# Patient Record
Sex: Male | Born: 1985
Health system: Southern US, Community
[De-identification: ages and names within clinical notes are randomized; demographics above are authoritative.]

## PROBLEM LIST (undated history)

## (undated) DIAGNOSIS — F419 Anxiety disorder, unspecified: Secondary | ICD-10-CM

---

## 2008-06-18 ENCOUNTER — Emergency Department (HOSPITAL_COMMUNITY): Admission: EM | Admit: 2008-06-18 | Discharge: 2008-06-18 | Payer: Self-pay | Admitting: Emergency Medicine

## 2012-11-15 ENCOUNTER — Emergency Department (HOSPITAL_COMMUNITY): Payer: No Typology Code available for payment source

## 2012-11-15 ENCOUNTER — Encounter (HOSPITAL_COMMUNITY): Payer: Self-pay

## 2012-11-15 ENCOUNTER — Emergency Department (HOSPITAL_COMMUNITY)
Admission: EM | Admit: 2012-11-15 | Discharge: 2012-11-15 | Disposition: A | Discharge status: Home / Self Care | Payer: No Typology Code available for payment source | Attending: Emergency Medicine | Admitting: Emergency Medicine

## 2012-11-15 DIAGNOSIS — T07XXXA Unspecified multiple injuries, initial encounter: Secondary | ICD-10-CM

## 2012-11-15 DIAGNOSIS — IMO0002 Reserved for concepts with insufficient information to code with codable children: Secondary | ICD-10-CM | POA: Insufficient documentation

## 2012-11-15 DIAGNOSIS — Y9389 Activity, other specified: Secondary | ICD-10-CM | POA: Insufficient documentation

## 2012-11-15 DIAGNOSIS — F17200 Nicotine dependence, unspecified, uncomplicated: Secondary | ICD-10-CM | POA: Insufficient documentation

## 2012-11-15 DIAGNOSIS — Y92410 Unspecified street and highway as the place of occurrence of the external cause: Secondary | ICD-10-CM | POA: Insufficient documentation

## 2012-11-15 DIAGNOSIS — R51 Headache: Secondary | ICD-10-CM | POA: Insufficient documentation

## 2012-11-15 DIAGNOSIS — M25519 Pain in unspecified shoulder: Secondary | ICD-10-CM | POA: Insufficient documentation

## 2012-11-15 DIAGNOSIS — Z23 Encounter for immunization: Secondary | ICD-10-CM | POA: Insufficient documentation

## 2012-11-15 MED ORDER — HYDROCODONE-ACETAMINOPHEN 5-325 MG PO TABS: 2.0000 | ORAL_TABLET | ORAL | Status: DC | PRN

## 2012-11-15 MED ORDER — OXYCODONE-ACETAMINOPHEN 5-325 MG PO TABS
2.0000 | ORAL_TABLET | Freq: Once | ORAL | Status: AC
  Administered 2012-11-15: 2 via ORAL
  Filled 2012-11-15: qty 2

## 2012-11-15 MED ORDER — IBUPROFEN 600 MG PO TABS: 600.0000 mg | ORAL_TABLET | Freq: Four times a day (QID) | ORAL | Status: DC | PRN

## 2012-11-15 MED ORDER — TETANUS-DIPHTH-ACELL PERTUSSIS 5-2.5-18.5 LF-MCG/0.5 IM SUSP
0.5000 mL | Freq: Once | INTRAMUSCULAR | Status: AC
  Administered 2012-11-15: 0.5 mL via INTRAMUSCULAR
  Filled 2012-11-15: qty 0.5

## 2012-11-15 MED ORDER — CYCLOBENZAPRINE HCL 10 MG PO TABS: 10.0000 mg | ORAL_TABLET | Freq: Two times a day (BID) | ORAL | Status: DC | PRN
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
## 2012-11-15 NOTE — ED Notes (Signed)
PA at bedside.
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
## 2012-11-15 NOTE — ED Notes (Signed)
Patient transported to CT/XR ?

## 2012-11-15 NOTE — ED Notes (Signed)
Voiced understanding of instructions given 
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
## 2012-11-16 NOTE — ED Provider Notes (Signed)
Medical screening examination/treatment/procedure(s) were performed by non-physician practitioner and as supervising physician I was immediately available for consultation/collaboration.  Veanna Dower, MD 11/16/12 0711 

## 2019-05-01 ENCOUNTER — Other Ambulatory Visit: Payer: Self-pay

## 2019-05-01 ENCOUNTER — Emergency Department (HOSPITAL_COMMUNITY)
Admission: EM | Admit: 2019-05-01 | Discharge: 2019-05-01 | Disposition: A | Discharge status: Home / Self Care | Payer: Self-pay | Attending: Emergency Medicine | Admitting: Emergency Medicine

## 2019-05-01 ENCOUNTER — Emergency Department (HOSPITAL_COMMUNITY): Payer: Self-pay

## 2019-05-01 DIAGNOSIS — R1030 Lower abdominal pain, unspecified: Secondary | ICD-10-CM | POA: Insufficient documentation

## 2019-05-01 DIAGNOSIS — F17210 Nicotine dependence, cigarettes, uncomplicated: Secondary | ICD-10-CM | POA: Insufficient documentation

## 2019-05-01 DIAGNOSIS — N3090 Cystitis, unspecified without hematuria: Secondary | ICD-10-CM | POA: Insufficient documentation

## 2019-05-01 DIAGNOSIS — F1210 Cannabis abuse, uncomplicated: Secondary | ICD-10-CM | POA: Insufficient documentation

## 2019-05-01 LAB — URINALYSIS, ROUTINE W REFLEX MICROSCOPIC
Bilirubin Urine: NEGATIVE
Glucose, UA: NEGATIVE mg/dL
Ketones, ur: 5 mg/dL — AB
Nitrite: NEGATIVE
Protein, ur: 100 mg/dL — AB
RBC / HPF: >50 RBC/hpf — ABNORMAL HIGH (ref 0–5)
Specific Gravity, Urine: 1.021 (ref 1.005–1.030)
WBC, UA: >50 WBC/hpf — ABNORMAL HIGH (ref 0–5)
pH: 5 (ref 5.0–8.0)

## 2019-05-01 MED ORDER — SULFAMETHOXAZOLE-TRIMETHOPRIM 800-160 MG PO TABS: 1.0000 | ORAL_TABLET | Freq: Two times a day (BID) | ORAL | 0 refills | Status: AC

## 2019-05-01 MED ORDER — HYDROMORPHONE HCL 1 MG/ML IJ SOLN
1.0000 mg | Freq: Once | INTRAMUSCULAR | Status: AC
  Administered 2019-05-01: 1 mg via INTRAMUSCULAR
  Filled 2019-05-01: qty 1

## 2019-05-01 MED ORDER — IBUPROFEN 600 MG PO TABS: 600.0000 mg | ORAL_TABLET | Freq: Four times a day (QID) | ORAL | 0 refills | Status: DC | PRN

## 2019-05-01 MED ORDER — ONDANSETRON 4 MG PO TBDP
4.0000 mg | ORAL_TABLET | Freq: Once | ORAL | Status: AC
  Administered 2019-05-01: 4 mg via ORAL
  Filled 2019-05-01: qty 1

## 2019-05-01 NOTE — ED Provider Notes (Signed)
[facility]-EMERGENCY DEPT Provider Note   CSN: [id number] Arrival date & time: 05/01/19  1405     History   Chief Complaint Chief Complaint  Patient presents with   Flank Pain    HPI Tyler [name] is a 33 y.o. male.     The history is provided by the patient. No language interpreter was used.  Flank Pain     33 year old male presenting for evaluation of flank pain.  Patient report acute onset bilateral flank pain and suprapubic pain that started today.  Pain has been persistent, waxing waning but sometimes very intense.  Pain is moderate to severe, rates as 9 out of 10.  He also have the urge to urinate each time he urinates he noticed blood clots in his urine.  He has never had this problem before.  He denies any associated fever chills no chest pain shortness of breath productive cough.  No prior history of kidney stones.  No recent injury.  No pain with sexual activity. No prior hx of STI.   No past medical history on file.  There are no active problems to display for this patient.   No past surgical history on file.      Home Medications    Prior to Admission medications   Medication Sig Start Date End Date Taking? Authorizing Provider  cyclobenzaprine (FLEXERIL) 10 MG tablet Take 1 tablet (10 mg total) by mouth 2 (two) times daily as needed for muscle spasms. 11/15/12   Fayrene Helperran, Neveen Daponte, PA-C  HYDROcodone-acetaminophen (NORCO/VICODIN) 5-325 MG per tablet Take 2 tablets by mouth every 4 (four) hours as needed for pain. 11/15/12   Fayrene Helperran, Shalee Paolo, PA-C  ibuprofen (ADVIL,MOTRIN) 600 MG tablet Take 1 tablet (600 mg total) by mouth every 6 (six) hours as needed for pain. 11/15/12   Fayrene Helperran, Taequan Stockhausen, PA-C    Family History No family history on file.  Social History Social History   Tobacco Use   Smoking status: Current Every Day Smoker    Packs/day: 1.00    Types: Cigarettes  Substance Use Topics   Alcohol use: Yes   Drug use: Yes    Types: Marijuana       Allergies   Patient has no known allergies.   Review of Systems Review of Systems  Genitourinary: Positive for flank pain.  All other systems reviewed and are negative.    Physical Exam Updated Vital Signs BP (!) 147/81    Pulse 95    Temp 99.3 F (37.4 C) (Oral)    Resp 16    Ht 6' (1.829 m)    Wt 90.7 kg    SpO2 99%    BMI 27.12 kg/m   Physical Exam Vitals signs and nursing note reviewed.  Constitutional:      General: He is not in acute distress.    Appearance: He is well-developed.  HENT:     Head: Atraumatic.  Eyes:     Conjunctiva/sclera: Conjunctivae normal.  Neck:     Musculoskeletal: Neck supple.  Cardiovascular:     Rate and Rhythm: Normal rate and regular rhythm.     Pulses: Normal pulses.     Heart sounds: Normal heart sounds.  Pulmonary:     Effort: Pulmonary effort is normal.     Breath sounds: Normal breath sounds.  Abdominal:     General: Abdomen is flat.     Tenderness: There is abdominal tenderness (Suprapubic tenderness on palpation no guarding rebound tenderness.). There is no right CVA  tenderness or left CVA tenderness.  Genitourinary:    Comments: Chaperone present during exam.  No inguinal lymphadenopathy or inguinal hernia noted.  Normal circumcised penis with small amount of blood noted at the urethral meatus but no signs of injury.  Testicle nontender with normal lie.  Scrotum soft nontender. Skin:    Findings: No rash.  Neurological:     Mental Status: He is alert.      ED Treatments / Results  Labs (all labs ordered are listed, but only abnormal results are displayed) Labs Reviewed  URINALYSIS, ROUTINE W REFLEX MICROSCOPIC - Abnormal; Notable for the following components:      Result Value   Color, Urine AMBER (*)    APPearance CLOUDY (*)    Hgb urine dipstick LARGE (*)    Ketones, ur 5 (*)    Protein, ur 100 (*)    Leukocytes,Ua MODERATE (*)    RBC / HPF >50 (*)    WBC, UA >50 (*)    Bacteria, UA RARE (*)    Non  Squamous Epithelial 0-5 (*)    All other components within normal limits  URINE CULTURE    EKG None  Radiology Ct Renal Stone Study  Result Date: 05/01/2019 CLINICAL DATA:  Flank pain and hematuria EXAM: CT ABDOMEN AND PELVIS WITHOUT CONTRAST TECHNIQUE: Multidetector CT imaging of the abdomen and pelvis was performed following the standard protocol without oral or IV contrast. COMPARISON:  None. FINDINGS: Lower chest: Lung bases are clear. Hepatobiliary: No focal liver lesions are evident on this noncontrast enhanced study. The gallbladder wall is not appreciably thickened. There is no biliary duct dilatation. Pancreas: There is no evident pancreatic mass or inflammatory focus. Spleen: No splenic lesions are appreciable. Adrenals/Urinary Tract: Adrenals bilaterally appear unremarkable. Kidneys bilaterally show no evident mass or hydronephrosis on either side. There is no appreciable renal or ureteral calculus on either side. Urinary bladder wall is diffusely thickened. Stomach/Bowel: There is no appreciable bowel wall or mesenteric thickening. No bowel obstruction. Terminal ileum appears normal. No evident free air or portal venous air. Vascular/Lymphatic: No abdominal aortic aneurysm. No vascular lesions are evident on this noncontrast enhanced study. There is no adenopathy in the abdomen or pelvis. Reproductive: Prostate and seminal vesicles are normal in size and contour. There is no evident pelvic mass. Other: Appendix appears normal. No abscess or ascites evident in the abdomen or pelvis. Musculoskeletal: No blastic or lytic bone lesions. A focal Schmorl's node is noted along the inferior aspect of the L4 vertebral body. There is no abdominal wall or intramuscular lesion. IMPRESSION: 1. Diffuse urinary bladder wall thickening, a finding felt to be indicative of a degree of cystitis. No hydronephrosis on either side. No renal or ureteral calculus. 2. No bowel obstruction. No abscess in the abdomen or  pelvis. Appendix appears normal. Electronically Signed   By: Lowella Grip III M.D.   On: 05/01/2019 16:29    Procedures Procedures (including critical care time)  Medications Ordered in ED Medications  HYDROmorphone (DILAUDID) injection 1 mg (1 mg Intramuscular Given 05/01/19 1439)  ondansetron (ZOFRAN-ODT) disintegrating tablet 4 mg (4 mg Oral Given 05/01/19 1445)     Initial Impression / Assessment and Plan / ED Course  I have reviewed the triage vital signs and the nursing notes.  Pertinent labs & imaging results that were available during my care of the patient were reviewed by me and considered in my medical decision making (see chart for details).  BP (!) 147/81    Pulse 95    Temp 99.3 F (37.4 C) (Oral)    Resp 16    Ht 6' (1.829 m)    Wt 90.7 kg    SpO2 99%    BMI 27.12 kg/m    Final Clinical Impressions(s) / ED Diagnoses   Final diagnoses:  None    ED Discharge Orders    None     2:37 PM Patient here with hematuria passing clots and having bilateral flank pain now suprapubic pain.  Suspect kidney stone.  Also suspect potential UTI.  UA obtained, will obtain renal stone study CT scan.  Pain medication given.  4:45 PM UA with cloudy appearance, large hemoglobin and urine dipsticks moderate leukocyte esterase as well as greater than 50 WBC and 50 RBC.  Findings suggestive of underlying urinary tract infection and potential kidney stone.  CT scan of the abdomen pelvis showing diffuse urinary bladder wall thickening, a finding felt to be indicative of a degree of cystitis.  No evidence of kidney stones noted.  No SBO no abscess and the appendix appears normal.  Pt doesn't feel that this is related to STI, denies risky sexual behavior.  Will treat cystitis with Bactrim.  Return precaution discussed.    Fayrene Helperran, Yuritzi Kamp, PA-C 05/01/19 1704    Lorre NickAllen, Anthony, MD 05/06/19 [phone]

## 2019-05-01 NOTE — ED Triage Notes (Signed)
States about 2 hours ago pain in bladder and now hematuria.

## 2019-05-03 LAB — URINE CULTURE: Culture: >=100000 — AB

## 2019-05-04 ENCOUNTER — Telehealth: Payer: Self-pay | Admitting: Emergency Medicine

## 2019-05-04 NOTE — Progress Notes (Signed)
ED Antimicrobial Stewardship Positive Culture Follow Up   Tyler [name] is an 33 y.o. male who presented to [facility] on 05/01/2019 with a chief complaint of  Chief Complaint  Patient presents with  . Flank Pain    Recent Results (from the past 720 hour(s))  Urine Culture     Status: Abnormal   Collection Time: 05/01/19  3:28 PM   Specimen: Urine, Random  Result Value Ref Range Status   Specimen Description   Final    URINE, RANDOM Performed at [facility] [address] [place], [place] [address]    Special Requests   Final    NONE Performed at [facility], [place] [address], [place], [place] [address]    Culture >=100,000 COLONIES/mL ESCHERICHIA COLI (A)  Final   Report Status 05/03/2019 FINAL  Final   Organism ID, Bacteria ESCHERICHIA COLI (A)  Final      Susceptibility   Escherichia coli - MIC*    AMPICILLIN >=32 RESISTANT Resistant     CEFAZOLIN <=4 SENSITIVE Sensitive     CEFTRIAXONE <=1 SENSITIVE Sensitive     CIPROFLOXACIN <=0.25 SENSITIVE Sensitive     GENTAMICIN <=1 SENSITIVE Sensitive     IMIPENEM <=0.25 SENSITIVE Sensitive     NITROFURANTOIN <=16 SENSITIVE Sensitive     TRIMETH/SULFA >=320 RESISTANT Resistant     AMPICILLIN/SULBACTAM >=32 RESISTANT Resistant     PIP/TAZO <=4 SENSITIVE Sensitive     Extended ESBL NEGATIVE Sensitive     * >=100,000 COLONIES/mL ESCHERICHIA COLI   No Known Allergies   [x]  Treated with SMX/TMP, organism resistant to prescribed antimicrobial  Plan: Stop taking SMX/TMP Start taking new prescription for ciprofloxacin  New antibiotic prescription: ciprofloxacin 500 mg PO  BID x 7 days. No refills.   ED Provider: Domenic Moras, PA-C   Lenis Noon, PharmD 05/04/2019, 10:34 AM Clinical Pharmacist [phone]

## 2019-05-05 NOTE — Telephone Encounter (Signed)
Post ED Visit - Positive Culture Follow-up: Unsuccessful Patient Follow-up  Culture assessed and recommendations reviewed by:  []  Elenor Quinones, Pharm.D. []  Heide Guile, Pharm.D., BCPS AQ-ID []  Parks Neptune, Pharm.D., BCPS []  Alycia Rossetti, Pharm.D., BCPS []  Chical, Pharm.D., BCPS, AAHIVP []  Legrand Como, Pharm.D., BCPS, AAHIVP []  Wynell Balloon, PharmD [x]  Samul Dada, PharmD, BCPS  Positive *urine culture  []  Patient discharged without antimicrobial prescription and treatment is now indicated [x]  Organism is resistant to prescribed ED discharge antimicrobial []  Patient with positive blood cultures   Unable to contact patient at phone number provided, letter will be sent to address on file  Plan: Stop Bactrim, Start taking Ciprofloxacin 500 mg PO BID x seven days Tyler Moras PA  Tyler [name] 05/05/2019, 1:37 PM

## 2020-07-02 DIAGNOSIS — F4325 Adjustment disorder with mixed disturbance of emotions and conduct: Secondary | ICD-10-CM | POA: Insufficient documentation

## 2020-07-03 DIAGNOSIS — F1220 Cannabis dependence, uncomplicated: Secondary | ICD-10-CM | POA: Diagnosis present

## 2020-07-03 DIAGNOSIS — Z630 Problems in relationship with spouse or partner: Secondary | ICD-10-CM | POA: Insufficient documentation

## 2020-07-03 DIAGNOSIS — F17200 Nicotine dependence, unspecified, uncomplicated: Secondary | ICD-10-CM | POA: Diagnosis present

## 2020-07-04 DIAGNOSIS — F1020 Alcohol dependence, uncomplicated: Secondary | ICD-10-CM | POA: Diagnosis present

## 2020-07-20 ENCOUNTER — Emergency Department (HOSPITAL_COMMUNITY): Payer: Self-pay

## 2020-07-20 ENCOUNTER — Encounter (HOSPITAL_COMMUNITY): Payer: Self-pay | Admitting: Internal Medicine

## 2020-07-20 ENCOUNTER — Other Ambulatory Visit: Payer: Self-pay

## 2020-07-20 ENCOUNTER — Inpatient Hospital Stay (HOSPITAL_COMMUNITY)
Admission: EM | Admit: 2020-07-20 | Discharge: 2020-07-24 | DRG: 853 | Disposition: A | Discharge status: Home / Self Care | Payer: Self-pay | Attending: Urology | Admitting: Urology

## 2020-07-20 ENCOUNTER — Encounter (HOSPITAL_COMMUNITY)
Admission: EM | Disposition: A | Discharge status: Home / Self Care | Payer: Self-pay | Source: Home / Self Care | Attending: Urology

## 2020-07-20 DIAGNOSIS — N412 Abscess of prostate: Secondary | ICD-10-CM | POA: Diagnosis present

## 2020-07-20 DIAGNOSIS — F17210 Nicotine dependence, cigarettes, uncomplicated: Secondary | ICD-10-CM | POA: Diagnosis present

## 2020-07-20 DIAGNOSIS — R6520 Severe sepsis without septic shock: Secondary | ICD-10-CM | POA: Diagnosis present

## 2020-07-20 DIAGNOSIS — A419 Sepsis, unspecified organism: Principal | ICD-10-CM

## 2020-07-20 DIAGNOSIS — Z20822 Contact with and (suspected) exposure to covid-19: Secondary | ICD-10-CM | POA: Diagnosis present

## 2020-07-20 DIAGNOSIS — R310 Gross hematuria: Secondary | ICD-10-CM | POA: Diagnosis present

## 2020-07-20 DIAGNOSIS — Z79899 Other long term (current) drug therapy: Secondary | ICD-10-CM

## 2020-07-20 DIAGNOSIS — K6289 Other specified diseases of anus and rectum: Secondary | ICD-10-CM | POA: Diagnosis present

## 2020-07-20 DIAGNOSIS — J9601 Acute respiratory failure with hypoxia: Secondary | ICD-10-CM

## 2020-07-20 DIAGNOSIS — N340 Urethral abscess: Secondary | ICD-10-CM | POA: Diagnosis present

## 2020-07-20 DIAGNOSIS — N179 Acute kidney failure, unspecified: Secondary | ICD-10-CM | POA: Diagnosis present

## 2020-07-20 LAB — URINALYSIS, ROUTINE W REFLEX MICROSCOPIC
Bilirubin Urine: NEGATIVE
Glucose, UA: NEGATIVE mg/dL
Ketones, ur: NEGATIVE mg/dL
Leukocytes,Ua: NEGATIVE
Nitrite: NEGATIVE
Protein, ur: 30 mg/dL — AB
Specific Gravity, Urine: >1.046 — ABNORMAL HIGH (ref 1.005–1.030)
pH: 5 (ref 5.0–8.0)

## 2020-07-20 LAB — CBC WITH DIFFERENTIAL/PLATELET
Abs Immature Granulocytes: 0.01 10*3/uL (ref 0.00–0.07)
Basophils Absolute: 0 10*3/uL (ref 0.0–0.1)
Basophils Relative: 0 %
Eosinophils Absolute: 0 10*3/uL (ref 0.0–0.5)
Eosinophils Relative: 0 %
HCT: 44.7 % (ref 39.0–52.0)
Hemoglobin: 15 g/dL (ref 13.0–17.0)
Immature Granulocytes: 0 %
Lymphocytes Relative: 18 %
Lymphs Abs: 0.6 10*3/uL — ABNORMAL LOW (ref 0.7–4.0)
MCH: 27.8 pg (ref 26.0–34.0)
MCHC: 33.6 g/dL (ref 30.0–36.0)
MCV: 82.8 fL (ref 80.0–100.0)
Monocytes Absolute: 0 10*3/uL — ABNORMAL LOW (ref 0.1–1.0)
Monocytes Relative: 1 %
Neutro Abs: 2.8 10*3/uL (ref 1.7–7.7)
Neutrophils Relative %: 81 %
Platelets: 209 10*3/uL (ref 150–400)
RBC: 5.4 MIL/uL (ref 4.22–5.81)
RDW: 14.2 % (ref 11.5–15.5)
WBC: 3.5 10*3/uL — ABNORMAL LOW (ref 4.0–10.5)
nRBC: 0 % (ref 0.0–0.2)

## 2020-07-20 LAB — COMPREHENSIVE METABOLIC PANEL
ALT: 25 U/L (ref 0–44)
AST: 49 U/L — ABNORMAL HIGH (ref 15–41)
Albumin: 3.9 g/dL (ref 3.5–5.0)
Alkaline Phosphatase: 105 U/L (ref 38–126)
Anion gap: 15 (ref 5–15)
BUN: 13 mg/dL (ref 6–20)
CO2: 24 mmol/L (ref 22–32)
Calcium: 10 mg/dL (ref 8.9–10.3)
Chloride: 97 mmol/L — ABNORMAL LOW (ref 98–111)
Creatinine, Ser: 1.87 mg/dL — ABNORMAL HIGH (ref 0.61–1.24)
GFR calc Af Amer: 54 mL/min — ABNORMAL LOW (ref >60)
GFR calc non Af Amer: 47 mL/min — ABNORMAL LOW (ref >60)
Glucose, Bld: 131 mg/dL — ABNORMAL HIGH (ref 70–99)
Potassium: 4.2 mmol/L (ref 3.5–5.1)
Sodium: 136 mmol/L (ref 135–145)
Total Bilirubin: 0.9 mg/dL (ref 0.3–1.2)
Total Protein: 9.2 g/dL — ABNORMAL HIGH (ref 6.5–8.1)

## 2020-07-20 LAB — PROTIME-INR
INR: 1 (ref 0.8–1.2)
Prothrombin Time: 13 seconds (ref 11.4–15.2)

## 2020-07-20 LAB — RESPIRATORY PANEL BY RT PCR (FLU A&B, COVID)
Influenza A by PCR: NEGATIVE
Influenza B by PCR: NEGATIVE
SARS Coronavirus 2 by RT PCR: NEGATIVE

## 2020-07-20 LAB — APTT: aPTT: 25 seconds (ref 24–36)

## 2020-07-20 LAB — LACTIC ACID, PLASMA
Lactic Acid, Venous: 4.2 mmol/L (ref 0.5–1.9)
Lactic Acid, Venous: 4.1 mmol/L (ref 0.5–1.9)

## 2020-07-20 SURGERY — CYSTOURETEROSCOPY, WITH RETROGRADE PYELOGRAM AND STENT INSERTION
Anesthesia: Choice

## 2020-07-20 MED ORDER — SODIUM CHLORIDE 0.9 % IV SOLN
2.0000 g | Freq: Once | INTRAVENOUS | Status: AC
  Administered 2020-07-20: 2 g via INTRAVENOUS
  Filled 2020-07-20: qty 2

## 2020-07-20 MED ORDER — LACTATED RINGERS IV BOLUS
1000.0000 mL | Freq: Once | INTRAVENOUS | Status: AC
  Administered 2020-07-20: 1000 mL via INTRAVENOUS

## 2020-07-20 MED ORDER — SODIUM CHLORIDE (PF) 0.9 % IJ SOLN
INTRAMUSCULAR | Status: AC
  Filled 2020-07-20: qty 50

## 2020-07-20 MED ORDER — LACTATED RINGERS IV BOLUS (SEPSIS)
500.0000 mL | Freq: Once | INTRAVENOUS | Status: AC
  Administered 2020-07-20: 500 mL via INTRAVENOUS

## 2020-07-20 MED ORDER — IOHEXOL 300 MG/ML  SOLN
100.0000 mL | Freq: Once | INTRAMUSCULAR | Status: AC | PRN
  Administered 2020-07-20: 100 mL via INTRAVENOUS

## 2020-07-20 MED ORDER — LACTATED RINGERS IV BOLUS (SEPSIS)
1000.0000 mL | Freq: Once | INTRAVENOUS | Status: AC
  Administered 2020-07-20: 1000 mL via INTRAVENOUS

## 2020-07-20 MED ORDER — ACETAMINOPHEN 325 MG PO TABS
650.0000 mg | ORAL_TABLET | Freq: Once | ORAL | Status: AC
  Administered 2020-07-20: 650 mg via ORAL
  Filled 2020-07-20: qty 2

## 2020-07-20 MED ORDER — LEVOFLOXACIN IN D5W 750 MG/150ML IV SOLN
750.0000 mg | INTRAVENOUS | Status: DC
  Administered 2020-07-21 – 2020-07-24 (×4): 750 mg via INTRAVENOUS
  Filled 2020-07-20 (×4): qty 150

## 2020-07-20 MED ORDER — LACTATED RINGERS IV SOLN: INTRAVENOUS | Status: AC

## 2020-07-20 NOTE — ED Notes (Signed)
Patient transported to CT 

## 2020-07-20 NOTE — Consult Note (Addendum)
Urology Consult   Physician requesting consult: Linwood DibblesJon Knapp, MD  Reason for consult: Prostate abscess  History of Present Illness: Tyler CoopDerek [name] is a 34 y.o. male with history of UTI who presents to the ED today for pelvic pain, fevers, and nausea/emesis.  He reports pelvic pain, flank pain, and gross hematuria starting 2 weeks ago. He saw his PCP and was diagnosed with a UTI, for which he has been on antibiotics. He reports resolution of his flank pain and gross hematuria on antibiotics, but his pelvic pain worsened, and he developed fevers to Tmax 103 today associated with chills and nausea/emesis. He presented to the ED for his symptoms.   He was febrile on presentation to the ED to Tmax 103.1, tachycardic to 120-160s, with relative hypotension to SBP 90s. Elevated Cr to 1.87 from normal baseline. Leukopenia to 3.5. Elevated lactate to 4.1. UA/Ucx pending. Bcx pending.  COVID negative. Received 2L NS and cefepime in ED. CT A/P showed multilocular fluid collection, likely abscess, within perineal soft tissues extending from prostate base and tracking into bulb of penile soft tissues (2.9 x 4.1 x 4.8 cm); bladder decompressed.   Patient currently denies any issues voiding.  Tested negative for HIV, hepatitis, and diabetes in 06/2020. [facility] and diagnosed with pyelonephritis. Prior Ucx with E Coli.   Patient reports being treated for UTI ~1 year ago. Associated symptoms were flank pain and gross hematuria, which resolved with antibiotics. Denies any other urologic history. Denies any family history of urologic issues. Denies any history of immunocompromising conditions.   Current smoker - 1ppd x ~20y.   Current Hospital Medications:  Home Meds:  No current facility-administered medications on file prior to encounter.   Current Outpatient Medications on File Prior to Encounter  Medication Sig Dispense Refill  . cyclobenzaprine (FLEXERIL) 10 MG tablet Take 1  tablet (10 mg total) by mouth 2 (two) times daily as needed for muscle spasms. (Patient not taking: Reported on 07/20/2020) 20 tablet 0  . HYDROcodone-acetaminophen (NORCO/VICODIN) 5-325 MG per tablet Take 2 tablets by mouth every 4 (four) hours as needed for pain. (Patient not taking: Reported on 07/20/2020) 10 tablet 0  . ibuprofen (ADVIL) 600 MG tablet Take 1 tablet (600 mg total) by mouth every 6 (six) hours as needed. (Patient not taking: Reported on 07/20/2020) 30 tablet 0  . traZODone (DESYREL) 50 MG tablet Take 50 mg by mouth at bedtime.       Scheduled Meds: . sodium chloride (PF)       Continuous Infusions: . lactated ringers Stopped (07/20/20 2115)    Allergies: No Known Allergies  No family history on file.  Social History:  reports that he has been smoking cigarettes. He has been smoking about 1.00 pack per day. He does not have any smokeless tobacco history on file. He reports current alcohol use. He reports current drug use. Drug: Marijuana.  ROS: A complete review of systems was performed.  All systems are negative except for pertinent findings as noted.  Physical Exam:  Vital signs in last 24 hours: Temp:  [102.8 F (39.3 C)-103.1 F (39.5 C)] 102.8 F (39.3 C) (10/04 2032) Pulse Rate:  [120-164] 120 (10/04 2200) Resp:  [9-30] 27 (10/04 2200) BP: (91-150)/(51-88) 98/56 (10/04 2200) SpO2:  [91 %-100 %] 96 % (10/04 2200) Weight:  [81.6 kg] 81.6 kg (10/04 1820) Constitutional:  Alert and oriented, No acute distress Cardiovascular: Tachycardic Respiratory: Normal respiratory effort on RA GI: Abdomen is  soft, nontender, nondistended, no abdominal masses GU: No CVA tenderness. Normal, circumcised phallus and urethral meatus. No pain on palpation of penile base. Normal scrotum. No pain with palpation of perineum. DRE with boggy prostate, not particularly tender but "uncomfortable" to patient  Neurologic: Grossly intact, no focal deficits Psychiatric: Normal mood and  affect  Laboratory Data:  Recent Labs    07/20/20 1822  WBC 3.5*  HGB 15.0  HCT 44.7  PLT 209    Recent Labs    07/20/20 1822  NA 136  K 4.2  CL 97*  GLUCOSE 131*  BUN 13  CALCIUM 10.0  CREATININE 1.87*     Results for orders placed or performed during the hospital encounter of 07/20/20 (from the past 24 hour(s))  Comprehensive metabolic panel     Status: Abnormal   Collection Time: 07/20/20  6:22 PM  Result Value Ref Range   Sodium 136 135 - 145 mmol/L   Potassium 4.2 3.5 - 5.1 mmol/L   Chloride 97 (L) 98 - 111 mmol/L   CO2 24 22 - 32 mmol/L   Glucose, Bld 131 (H) 70 - 99 mg/dL   BUN 13 6 - 20 mg/dL   Creatinine, Ser 1.88 (H) 0.61 - 1.24 mg/dL   Calcium 41.6 8.9 - 60.6 mg/dL   Total Protein 9.2 (H) 6.5 - 8.1 g/dL   Albumin 3.9 3.5 - 5.0 g/dL   AST 49 (H) 15 - 41 U/L   ALT 25 0 - 44 U/L   Alkaline Phosphatase 105 38 - 126 U/L   Total Bilirubin 0.9 0.3 - 1.2 mg/dL   GFR calc non Af Amer 47 (L) >60 mL/min   GFR calc Af Amer 54 (L) >60 mL/min   Anion gap 15 5 - 15  Lactic acid, plasma     Status: Abnormal   Collection Time: 07/20/20  6:22 PM  Result Value Ref Range   Lactic Acid, Venous 4.2 (HH) 0.5 - 1.9 mmol/L  CBC with Differential     Status: Abnormal   Collection Time: 07/20/20  6:22 PM  Result Value Ref Range   WBC 3.5 (L) 4.0 - 10.5 K/uL   RBC 5.40 4.22 - 5.81 MIL/uL   Hemoglobin 15.0 13.0 - 17.0 g/dL   HCT 30.1 39 - 52 %   MCV 82.8 80.0 - 100.0 fL   MCH 27.8 26.0 - 34.0 pg   MCHC 33.6 30.0 - 36.0 g/dL   RDW 60.1 09.3 - 23.5 %   Platelets 209 150 - 400 K/uL   nRBC 0.0 0.0 - 0.2 %   Neutrophils Relative % 81 %   Neutro Abs 2.8 1.7 - 7.7 K/uL   Lymphocytes Relative 18 %   Lymphs Abs 0.6 (L) 0.7 - 4.0 K/uL   Monocytes Relative 1 %   Monocytes Absolute 0.0 (L) 0 - 1 K/uL   Eosinophils Relative 0 %   Eosinophils Absolute 0.0 0 - 0 K/uL   Basophils Relative 0 %   Basophils Absolute 0.0 0 - 0 K/uL   WBC Morphology TOXIC GRANULATION    Immature  Granulocytes 0 %   Abs Immature Granulocytes 0.01 0.00 - 0.07 K/uL  Protime-INR     Status: None   Collection Time: 07/20/20  6:22 PM  Result Value Ref Range   Prothrombin Time 13.0 11.4 - 15.2 seconds   INR 1.0 0.8 - 1.2  APTT     Status: None   Collection Time: 07/20/20  6:44 PM  Result Value  Ref Range   aPTT 25 24 - 36 seconds  Respiratory Panel by RT PCR (Flu A&B, Covid) - Nasopharyngeal Swab     Status: None   Collection Time: 07/20/20  6:46 PM   Specimen: Nasopharyngeal Swab  Result Value Ref Range   SARS Coronavirus 2 by RT PCR NEGATIVE NEGATIVE   Influenza A by PCR NEGATIVE NEGATIVE   Influenza B by PCR NEGATIVE NEGATIVE  Lactic acid, plasma     Status: Abnormal   Collection Time: 07/20/20  8:22 PM  Result Value Ref Range   Lactic Acid, Venous 4.1 (HH) 0.5 - 1.9 mmol/L   Recent Results (from the past 240 hour(s))  Respiratory Panel by RT PCR (Flu A&B, Covid) - Nasopharyngeal Swab     Status: None   Collection Time: 07/20/20  6:46 PM   Specimen: Nasopharyngeal Swab  Result Value Ref Range Status   SARS Coronavirus 2 by RT PCR NEGATIVE NEGATIVE Final    Comment: (NOTE) SARS-CoV-2 target nucleic acids are NOT DETECTED.  The SARS-CoV-2 RNA is generally detectable in upper respiratoy specimens during the acute phase of infection. The lowest concentration of SARS-CoV-2 viral copies this assay can detect is 131 copies/mL. A negative result does not preclude SARS-Cov-2 infection and should not be used as the sole basis for treatment or other patient management decisions. A negative result may occur with  improper specimen collection/handling, submission of specimen other than nasopharyngeal swab, presence of viral mutation(s) within the areas targeted by this assay, and inadequate number of viral copies (<131 copies/mL). A negative result must be combined with clinical observations, patient history, and epidemiological information. The expected result is Negative.  Fact  Sheet for Patients:  https://www.moore.com/  Fact Sheet for Healthcare Providers:  https://www.young.biz/  This test is no t yet approved or cleared by the [place] FDA and  has been authorized for detection and/or diagnosis of SARS-CoV-2 by FDA under an Emergency Use Authorization (EUA). This EUA will remain  in effect (meaning this test can be used) for the duration of the COVID-19 declaration under Section 564(b)(1) of the Act, 21 U.S.C. section 360bbb-3(b)(1), unless the authorization is terminated or revoked sooner.     Influenza A by PCR NEGATIVE NEGATIVE Final   Influenza B by PCR NEGATIVE NEGATIVE Final    Comment: (NOTE) The Xpert Xpress SARS-CoV-2/FLU/RSV assay is intended as an aid in  the diagnosis of influenza from Nasopharyngeal swab specimens and  should not be used as a sole basis for treatment. Nasal washings and  aspirates are unacceptable for Xpert Xpress SARS-CoV-2/FLU/RSV  testing.  Fact Sheet for Patients: https://www.moore.com/  Fact Sheet for Healthcare Providers: https://www.young.biz/  This test is not yet approved or cleared by the [place] FDA and  has been authorized for detection and/or diagnosis of SARS-CoV-2 by  FDA under an Emergency Use Authorization (EUA). This EUA will remain  in effect (meaning this test can be used) for the duration of the  Covid-19 declaration under Section 564(b)(1) of the Act, 21  U.S.C. section 360bbb-3(b)(1), unless the authorization is  terminated or revoked. Performed at [facility], [address], [place], [place] [address]     Renal Function: Recent Labs    07/20/20 1822  CREATININE 1.87*   Estimated Creatinine Clearance: 62.2 mL/min (A) (by C-G formula based on SCr of 1.87 mg/dL (H)).  Radiologic Imaging: CT ABDOMEN PELVIS W CONTRAST  Result Date: 07/20/2020 CLINICAL DATA:  Abdominal pain,  perineal pain question of abscess  EXAM: CT ABDOMEN AND PELVIS WITH CONTRAST TECHNIQUE: Multidetector CT imaging of the abdomen and pelvis was performed using the standard protocol following bolus administration of intravenous contrast. CONTRAST:  OMNIPAQUE IOHEXOL 300 MG/ML  SOLN COMPARISON:  None. FINDINGS: Lower chest: The visualized heart size within normal limits. No pericardial fluid/thickening. No hiatal hernia. The visualized portions of the lungs are clear. Hepatobiliary: The liver is normal in density without focal abnormality.The main portal vein is patent. No evidence of calcified gallstones, gallbladder wall thickening or biliary dilatation. Pancreas: Unremarkable. No pancreatic ductal dilatation or surrounding inflammatory changes. Spleen: Normal in size without focal abnormality. Adrenals/Urinary Tract: Both adrenal glands appear normal. The kidneys and collecting system appear normal without evidence of urinary tract calculus or hydronephrosis. Bladder is partially decompressed with apparent diffuse wall thickening. Stomach/Bowel: The stomach, small bowel, and colon are normal in appearance. No inflammatory changes, wall thickening, or obstructive findings.The appendix is normal. Vascular/Lymphatic: There are no enlarged mesenteric, retroperitoneal, or pelvic lymph nodes. No significant vascular findings are present. Reproductive: Within the base of the prostate gland extending into the inferior peroneal soft tissues and base of the penile bulb, right greater than left there is a multilocular fluid collection measuring approximately 2.9 x 4.1 by 4.8 cm. Other: No hernia or abdominal wall tissue mass. No subcutaneous emphysema seen within the soft tissues. Musculoskeletal: No acute or significant osseous findings. IMPRESSION: 1. Multilocular fluid collection, likely abscess,within the perineal soft tissues though to be extending from the prostate base and tracking into the bulb of the penile soft  tissues, right greater than left ,measuring 2.9 x 4.1 x 4.8 cm. 2. -Bladder is partially decompressed with apparent wall thickening which could be due to acute cystitis versus physiologic under distension. Electronically Signed   By: Jonna Clark M.D.   On: 07/20/2020 20:42   DG Chest Port 1 View  Result Date: 07/20/2020 CLINICAL DATA:  Suspected Sepsis Tachycardia, fatigue and loss of consciousness. Nausea and vomiting. EXAM: PORTABLE CHEST 1 VIEW COMPARISON:  None. FINDINGS: The cardiomediastinal contours are normal. The lungs are clear. Pulmonary vasculature is normal. No consolidation, pleural effusion, or pneumothorax. No acute osseous abnormalities are seen. IMPRESSION: Negative portable AP view of the chest. Electronically Signed   By: Narda Rutherford M.D.   On: 07/20/2020 18:57    I independently reviewed the above imaging studies.  Impression/Recommendation 34yo male with history of E Coli UTI who presents to the ED with pelvic pain, fevers, and imaging demonstrating a 2.9 x 4.1 x 4.8 cm prostate abscess. Clinically well-appearing with only mild prostate bogginess/tenderness on exam but with vitals and labs concerning for sepsis.   Discussed case with IR, who do not feel that percutaneous drainage would be possible. Thus, we recommend proceeding with urgent transurethral unroofing of prostate abscess. Procedure details as well as risks, benefits, and alternatives were discussed with the patient, who would like to proceed. Discussed that the patient will likely have a foley catheter for several days after the procedure for urethral healing.  - Plan for transurethral unroofing of prostate abscess tonight - Please keep patient NPO in anticipation of procedure - Continue broad spectrum antibiotics. Recommend switching to levaquin for more prostatic tissue penetration. Follow up urine and blood cultures. Narrow antibiotics accordingly  Margette Fast 07/20/2020, 10:09 PM

## 2020-07-20 NOTE — Progress Notes (Signed)
A consult was received from an ED physician for Cefepime per pharmacy dosing.  The patient's profile has been reviewed for ht/wt/allergies/indication/available labs.   A one time order has been placed for Cefepime 2gm IV.  Further antibiotics/pharmacy consults should be ordered by admitting physician if indicated.                       Thank you, Junita Push PharmD, BCPS 07/20/2020  6:47 PM

## 2020-07-20 NOTE — ED Provider Notes (Signed)
[facility]-EMERGENCY DEPT Provider Note   CSN: [id number] Arrival date & time: 07/20/20  1805     History Chief Complaint  Patient presents with  . Tachycardia    Tyler [name] is a 34 y.o. male.  HPI   Pt states about 1 week ago he started having pain with urination.  He thought he might have a urinary tract infection. He states he went to a doctor and was started on abx.  His sx have improved and have gotten worse over the past week.  Patient has started develop pain in his scrotal and perianal region.  He has not noticed any rashes or swelling.  No penile discharge patient states it hurts for him to even sit.  He started developing fevers.  Today he was feeling weak and lightheaded.  He had an episode of loss of consciousness.  He denies any chest pain.  No nausea or vomiting.  No diarrhea.  Patient denies cough or shortness of breath.  He has not been vaccinated for Covid.  No past medical history on file.  There are no problems to display for this patient.   No past surgical history on file.     No family history on file.  Social History   Tobacco Use  . Smoking status: Current Every Day Smoker    Packs/day: 1.00    Types: Cigarettes  Substance Use Topics  . Alcohol use: Yes  . Drug use: Yes    Types: Marijuana    Home Medications Prior to Admission medications   Medication Sig Start Date End Date Taking? Authorizing Provider  cyclobenzaprine (FLEXERIL) 10 MG tablet Take 1 tablet (10 mg total) by mouth 2 (two) times daily as needed for muscle spasms. Patient not taking: Reported on 07/20/2020 11/15/12   Fayrene Helper, PA-C  HYDROcodone-acetaminophen (NORCO/VICODIN) 5-325 MG per tablet Take 2 tablets by mouth every 4 (four) hours as needed for pain. Patient not taking: Reported on 07/20/2020 11/15/12   Fayrene Helper, PA-C  ibuprofen (ADVIL) 600 MG tablet Take 1 tablet (600 mg total) by mouth every 6 (six) hours as needed. Patient not taking: Reported  on 07/20/2020 05/01/19   Fayrene Helper, PA-C  traZODone (DESYREL) 50 MG tablet Take 50 mg by mouth at bedtime. 07/05/20   [provider]    Allergies    Patient has no known allergies.  Review of Systems   Review of Systems  All other systems reviewed and are negative.   Physical Exam Updated Vital Signs BP 118/82   Pulse (!) 123   Temp (!) 102.8 F (39.3 C) (Oral)   Resp (!) 26   Ht 1.829 m (6')   Wt 81.6 kg   SpO2 97%   BMI 24.41 kg/m   Physical Exam Vitals and nursing note reviewed.  Constitutional:      General: He is not in acute distress.    Appearance: He is well-developed.  HENT:     Head: Normocephalic and atraumatic.     Right Ear: External ear normal.     Left Ear: External ear normal.  Eyes:     General: No scleral icterus.       Right eye: No discharge.        Left eye: No discharge.     Conjunctiva/sclera: Conjunctivae normal.  Neck:     Trachea: No tracheal deviation.  Cardiovascular:     Rate and Rhythm: Regular rhythm. Tachycardia present.  Pulmonary:     Effort: Pulmonary  effort is normal. No respiratory distress.     Breath sounds: Normal breath sounds. No stridor. No wheezing or rales.  Abdominal:     General: Bowel sounds are normal. There is no distension.     Palpations: Abdomen is soft.     Tenderness: There is no abdominal tenderness. There is no guarding or rebound.  Genitourinary:    Penis: Normal.      Testes: Normal.     Comments: ttp perineum in between scrotum and anus, no fluctuance, no erythema noted Musculoskeletal:        General: No tenderness.     Cervical back: Neck supple.  Skin:    General: Skin is warm and dry.     Findings: No rash.  Neurological:     Mental Status: He is alert.     Cranial Nerves: No cranial nerve deficit (no facial droop, extraocular movements intact, no slurred speech).     Sensory: No sensory deficit.     Motor: No abnormal muscle tone or seizure activity.     Coordination:  Coordination normal.     ED Results / Procedures / Treatments   Labs (all labs ordered are listed, but only abnormal results are displayed) Labs Reviewed  COMPREHENSIVE METABOLIC PANEL - Abnormal; Notable for the following components:      Result Value   Chloride 97 (*)    Glucose, Bld 131 (*)    Creatinine, Ser 1.87 (*)    Total Protein 9.2 (*)    AST 49 (*)    GFR calc non Af Amer 47 (*)    GFR calc Af Amer 54 (*)    All other components within normal limits  LACTIC ACID, PLASMA - Abnormal; Notable for the following components:   Lactic Acid, Venous 4.2 (*)    All other components within normal limits  LACTIC ACID, PLASMA - Abnormal; Notable for the following components:   Lactic Acid, Venous 4.1 (*)    All other components within normal limits  CBC WITH DIFFERENTIAL/PLATELET - Abnormal; Notable for the following components:   WBC 3.5 (*)    Lymphs Abs 0.6 (*)    Monocytes Absolute 0.0 (*)    All other components within normal limits  URINALYSIS, ROUTINE W REFLEX MICROSCOPIC - Abnormal; Notable for the following components:   Specific Gravity, Urine >1.046 (*)    Hgb urine dipstick SMALL (*)    Protein, ur 30 (*)    Bacteria, UA RARE (*)    All other components within normal limits  RESPIRATORY PANEL BY RT PCR (FLU A&B, COVID)  CULTURE, BLOOD (ROUTINE X 2)  CULTURE, BLOOD (ROUTINE X 2)  URINE CULTURE  PROTIME-INR  APTT  HIV ANTIBODY (ROUTINE TESTING W REFLEX)    EKG EKG Interpretation  Date/Time:  Monday July 20 2020 19:30:24 EDT Ventricular Rate:  112 PR Interval:    QRS Duration: 78 QT Interval:  287 QTC Calculation: 392 R Axis:   85 Text Interpretation: Sinus tachycardia Biatrial enlargement No old tracing to compare Confirmed by Linwood Dibbles [phone]) on 07/20/2020 7:46:58 PM   Radiology CT ABDOMEN PELVIS W CONTRAST  Result Date: 07/20/2020 CLINICAL DATA:  Abdominal pain, perineal pain question of abscess EXAM: CT ABDOMEN AND PELVIS WITH CONTRAST  TECHNIQUE: Multidetector CT imaging of the abdomen and pelvis was performed using the standard protocol following bolus administration of intravenous contrast. CONTRAST:  OMNIPAQUE IOHEXOL 300 MG/ML  SOLN COMPARISON:  None. FINDINGS: Lower chest: The visualized heart size within normal  limits. No pericardial fluid/thickening. No hiatal hernia. The visualized portions of the lungs are clear. Hepatobiliary: The liver is normal in density without focal abnormality.The main portal vein is patent. No evidence of calcified gallstones, gallbladder wall thickening or biliary dilatation. Pancreas: Unremarkable. No pancreatic ductal dilatation or surrounding inflammatory changes. Spleen: Normal in size without focal abnormality. Adrenals/Urinary Tract: Both adrenal glands appear normal. The kidneys and collecting system appear normal without evidence of urinary tract calculus or hydronephrosis. Bladder is partially decompressed with apparent diffuse wall thickening. Stomach/Bowel: The stomach, small bowel, and colon are normal in appearance. No inflammatory changes, wall thickening, or obstructive findings.The appendix is normal. Vascular/Lymphatic: There are no enlarged mesenteric, retroperitoneal, or pelvic lymph nodes. No significant vascular findings are present. Reproductive: Within the base of the prostate gland extending into the inferior peroneal soft tissues and base of the penile bulb, right greater than left there is a multilocular fluid collection measuring approximately 2.9 x 4.1 by 4.8 cm. Other: No hernia or abdominal wall tissue mass. No subcutaneous emphysema seen within the soft tissues. Musculoskeletal: No acute or significant osseous findings. IMPRESSION: 1. Multilocular fluid collection, likely abscess,within the perineal soft tissues though to be extending from the prostate base and tracking into the bulb of the penile soft tissues, right greater than left ,measuring 2.9 x 4.1 x 4.8 cm. 2. -Bladder  is partially decompressed with apparent wall thickening which could be due to acute cystitis versus physiologic under distension. Electronically Signed   By: Jonna Clark M.D.   On: 07/20/2020 20:42   DG Chest Port 1 View  Result Date: 07/20/2020 CLINICAL DATA:  Suspected Sepsis Tachycardia, fatigue and loss of consciousness. Nausea and vomiting. EXAM: PORTABLE CHEST 1 VIEW COMPARISON:  None. FINDINGS: The cardiomediastinal contours are normal. The lungs are clear. Pulmonary vasculature is normal. No consolidation, pleural effusion, or pneumothorax. No acute osseous abnormalities are seen. IMPRESSION: Negative portable AP view of the chest. Electronically Signed   By: Narda Rutherford M.D.   On: 07/20/2020 18:57    Procedures .Critical Care Performed by: Linwood Dibbles, MD Authorized by: Linwood Dibbles, MD   Critical care provider statement:    Critical care time (minutes):  45   Critical care was time spent personally by me on the following activities:  Discussions with consultants, evaluation of patient's response to treatment, examination of patient, ordering and performing treatments and interventions, ordering and review of laboratory studies, ordering and review of radiographic studies, pulse oximetry, re-evaluation of patient's condition, obtaining history from patient or surrogate and review of old charts   (including critical care time)  Medications Ordered in ED Medications  lactated ringers infusion (0 mLs Intravenous Hold 07/20/20 2115)  sodium chloride (PF) 0.9 % injection (has no administration in time range)  lactated ringers bolus 1,000 mL (0 mLs Intravenous Stopped 07/20/20 2006)    And  lactated ringers bolus 1,000 mL (0 mLs Intravenous Stopped 07/20/20 2140)    And  lactated ringers bolus 500 mL (500 mLs Intravenous New Bag/Given 07/20/20 2140)  ceFEPIme (MAXIPIME) 2 g in sodium chloride 0.9 % 100 mL IVPB (0 g Intravenous Stopped 07/20/20 2044)  iohexol (OMNIPAQUE) 300 MG/ML  solution 100 mL (100 mLs Intravenous Contrast Given 07/20/20 2018)  acetaminophen (TYLENOL) tablet 650 mg (650 mg Oral Given 07/20/20 2114)  lactated ringers bolus 1,000 mL (1,000 mLs Intravenous New Bag/Given 07/20/20 2140)    ED Course  I have reviewed the triage vital signs and the nursing notes.  Pertinent labs &  imaging results that were available during my care of the patient were reviewed by me and considered in my medical decision making (see chart for details).  Clinical Course as of Jul 20 2324  Sheral FlowMon Jul 20, 2020  2059 CT scan shows prostate abscess that is tracking down to the penile bulb.   [JK]  2059 Blood pressure is improved with patient does remain tachycardic.   [JK]  2132 Discussed with Dr Lindajo RoyalMcCloskey.  Will see pt in ED.   [JK]  2324 Pt seen Dr Lindajo RoyalMcCloskey.  Pt would favor non operative management now.  IR will discuss whether he is a candidate.  Plan on medical admission.  Urology will follow closely   [JK]    Clinical Course User Index [JK] Linwood DibblesKnapp, Millette Halberstam, MD   MDM Rules/Calculators/A&P                          Patient presented to the ED for evaluation of pain in his perineum associated with fever.  Symptoms were concerning for possible sepsis.  Patient has started on antibiotics and given IV fluids per sepsis protocol.  Labs were notable for a bump in his creatinine and elevated lactic acid level.  Patient's heart rate improved but he did remain tachycardic.  Blood pressure remained in the 90s.  CT scan showed prostate abscess extending to the penis.  Case discussed with urology, Dr Burns SpainMcCluskey who will see patient in the ED to determine possible need for I&D tonight.  Will keep NPO.  Plan on admission for further treatment. Final Clinical Impression(s) / ED Diagnoses Final diagnoses:  Prostate abscess  Sepsis without acute organ dysfunction, due to unspecified organism [facility](HCC)      Linwood DibblesKnapp, Janayah Zavada, MD 07/20/20 2326

## 2020-07-20 NOTE — Sepsis Progress Note (Signed)
Patient currently in CT scan and will receive antibiotics as soon as possible.

## 2020-07-20 NOTE — ED Notes (Signed)
Date and time results received: 07/20/20 7:25 PM  (use smartphrase ".now" to insert current time)  Test: Lactic Acid Critical Value: 4.2  Name of Provider Notified:Dr.  Roselyn Bering   Orders Received? Or Actions Taken?:

## 2020-07-20 NOTE — ED Triage Notes (Signed)
Patient presents to the ER for Loss of consciousness, tachycardia, fatigue.  Patient denies chest pain. Reports nausea and emesis. Patient is diaphoretic at this time.

## 2020-07-21 ENCOUNTER — Emergency Department (HOSPITAL_COMMUNITY): Payer: Self-pay | Admitting: Certified Registered Nurse Anesthetist

## 2020-07-21 ENCOUNTER — Observation Stay (HOSPITAL_COMMUNITY): Payer: Self-pay

## 2020-07-21 ENCOUNTER — Encounter (HOSPITAL_COMMUNITY): Payer: Self-pay | Admitting: Urology

## 2020-07-21 ENCOUNTER — Inpatient Hospital Stay (HOSPITAL_COMMUNITY): Payer: Self-pay

## 2020-07-21 ENCOUNTER — Encounter (HOSPITAL_COMMUNITY)
Admission: EM | Disposition: A | Discharge status: Home / Self Care | Payer: Self-pay | Source: Home / Self Care | Attending: Urology

## 2020-07-21 DIAGNOSIS — A419 Sepsis, unspecified organism: Principal | ICD-10-CM

## 2020-07-21 DIAGNOSIS — R6520 Severe sepsis without septic shock: Secondary | ICD-10-CM

## 2020-07-21 DIAGNOSIS — N412 Abscess of prostate: Secondary | ICD-10-CM | POA: Diagnosis present

## 2020-07-21 DIAGNOSIS — J9601 Acute respiratory failure with hypoxia: Secondary | ICD-10-CM

## 2020-07-21 DIAGNOSIS — N170 Acute kidney failure with tubular necrosis: Secondary | ICD-10-CM

## 2020-07-21 HISTORY — PX: TRANSURETHRAL RESECTION OF PROSTATE: SHX73

## 2020-07-21 LAB — BASIC METABOLIC PANEL
Anion gap: 11 (ref 5–15)
BUN: 18 mg/dL (ref 6–20)
CO2: 23 mmol/L (ref 22–32)
Calcium: 8.1 mg/dL — ABNORMAL LOW (ref 8.9–10.3)
Chloride: 102 mmol/L (ref 98–111)
Creatinine, Ser: 1.57 mg/dL — ABNORMAL HIGH (ref 0.61–1.24)
GFR calc Af Amer: >60 mL/min (ref >60)
GFR calc non Af Amer: 57 mL/min — ABNORMAL LOW (ref >60)
Glucose, Bld: 132 mg/dL — ABNORMAL HIGH (ref 70–99)
Potassium: 4.1 mmol/L (ref 3.5–5.1)
Sodium: 136 mmol/L (ref 135–145)

## 2020-07-21 LAB — LACTIC ACID, PLASMA
Lactic Acid, Venous: 1.9 mmol/L (ref 0.5–1.9)
Lactic Acid, Venous: 3.5 mmol/L (ref 0.5–1.9)
Lactic Acid, Venous: 2.7 mmol/L (ref 0.5–1.9)

## 2020-07-21 LAB — CBC
HCT: 34 % — ABNORMAL LOW (ref 39.0–52.0)
Hemoglobin: 11.5 g/dL — ABNORMAL LOW (ref 13.0–17.0)
MCH: 28 pg (ref 26.0–34.0)
MCHC: 33.8 g/dL (ref 30.0–36.0)
MCV: 82.7 fL (ref 80.0–100.0)
Platelets: 141 10*3/uL — ABNORMAL LOW (ref 150–400)
RBC: 4.11 MIL/uL — ABNORMAL LOW (ref 4.22–5.81)
RDW: 14.6 % (ref 11.5–15.5)
WBC: 24.2 10*3/uL — ABNORMAL HIGH (ref 4.0–10.5)
nRBC: 0 % (ref 0.0–0.2)

## 2020-07-21 LAB — MRSA PCR SCREENING: MRSA by PCR: NEGATIVE

## 2020-07-21 LAB — URINE CULTURE: Culture: NO GROWTH

## 2020-07-21 LAB — HIV ANTIBODY (ROUTINE TESTING W REFLEX): HIV Screen 4th Generation wRfx: NONREACTIVE

## 2020-07-21 SURGERY — TURP (TRANSURETHRAL RESECTION OF PROSTATE)
Anesthesia: Choice

## 2020-07-21 SURGERY — TURP (TRANSURETHRAL RESECTION OF PROSTATE)
Anesthesia: General | Site: Prostate

## 2020-07-21 MED ORDER — CHLORHEXIDINE GLUCONATE CLOTH 2 % EX PADS
6.0000 | MEDICATED_PAD | Freq: Every day | CUTANEOUS | Status: DC
  Administered 2020-07-22 – 2020-07-24 (×2): 6 via TOPICAL

## 2020-07-21 MED ORDER — OXYBUTYNIN CHLORIDE ER 5 MG PO TB24
10.0000 mg | ORAL_TABLET | Freq: Every day | ORAL | Status: DC
  Administered 2020-07-21 – 2020-07-23 (×3): 10 mg via ORAL
  Filled 2020-07-21: qty 2
  Filled 2020-07-21: qty 1
  Filled 2020-07-21: qty 2

## 2020-07-21 MED ORDER — ONDANSETRON HCL 4 MG/2ML IJ SOLN
INTRAMUSCULAR | Status: DC | PRN
  Administered 2020-07-21: 4 mg via INTRAVENOUS

## 2020-07-21 MED ORDER — MIDAZOLAM HCL 5 MG/5ML IJ SOLN
INTRAMUSCULAR | Status: DC | PRN
  Administered 2020-07-21: 2 mg via INTRAVENOUS

## 2020-07-21 MED ORDER — ORAL CARE MOUTH RINSE
15.0000 mL | Freq: Two times a day (BID) | OROMUCOSAL | Status: DC
  Administered 2020-07-21 – 2020-07-22 (×2): 15 mL via OROMUCOSAL

## 2020-07-21 MED ORDER — CHLORHEXIDINE GLUCONATE 0.12 % MT SOLN
15.0000 mL | Freq: Two times a day (BID) | OROMUCOSAL | Status: DC
  Administered 2020-07-22 – 2020-07-23 (×4): 15 mL via OROMUCOSAL
  Filled 2020-07-21 (×5): qty 15

## 2020-07-21 MED ORDER — CHLORHEXIDINE GLUCONATE CLOTH 2 % EX PADS
6.0000 | MEDICATED_PAD | Freq: Every day | CUTANEOUS | Status: DC
  Administered 2020-07-21 – 2020-07-24 (×3): 6 via TOPICAL

## 2020-07-21 MED ORDER — MORPHINE SULFATE (PF) 4 MG/ML IV SOLN
1.0000 mg | INTRAVENOUS | Status: DC | PRN
  Administered 2020-07-21 (×2): 2 mg via INTRAVENOUS
  Filled 2020-07-21 (×2): qty 1

## 2020-07-21 MED ORDER — LIDOCAINE HCL 1 % IJ SOLN
INTRAMUSCULAR | Status: AC
  Filled 2020-07-21: qty 20

## 2020-07-21 MED ORDER — ONDANSETRON HCL 4 MG/2ML IJ SOLN: 4.0000 mg | INTRAMUSCULAR | Status: DC | PRN

## 2020-07-21 MED ORDER — FENTANYL CITRATE (PF) 100 MCG/2ML IJ SOLN
25.0000 ug | INTRAMUSCULAR | Status: DC | PRN
  Administered 2020-07-21 (×2): 25 ug via INTRAVENOUS

## 2020-07-21 MED ORDER — SUGAMMADEX SODIUM 500 MG/5ML IV SOLN
INTRAVENOUS | Status: AC
  Filled 2020-07-21: qty 5

## 2020-07-21 MED ORDER — PROPOFOL 10 MG/ML IV BOLUS
INTRAVENOUS | Status: AC
  Filled 2020-07-21: qty 20

## 2020-07-21 MED ORDER — MEPERIDINE HCL 100 MG/ML IJ SOLN
INTRAMUSCULAR | Status: AC
  Filled 2020-07-21: qty 1

## 2020-07-21 MED ORDER — ROCURONIUM BROMIDE 10 MG/ML (PF) SYRINGE
PREFILLED_SYRINGE | INTRAVENOUS | Status: AC
  Filled 2020-07-21: qty 10

## 2020-07-21 MED ORDER — FENTANYL CITRATE (PF) 100 MCG/2ML IJ SOLN
INTRAMUSCULAR | Status: AC
  Filled 2020-07-21: qty 2

## 2020-07-21 MED ORDER — DIPHENHYDRAMINE HCL 50 MG/ML IJ SOLN
INTRAMUSCULAR | Status: AC
  Filled 2020-07-21: qty 1

## 2020-07-21 MED ORDER — TRAZODONE HCL 50 MG PO TABS
50.0000 mg | ORAL_TABLET | Freq: Every day | ORAL | Status: DC
  Administered 2020-07-22 – 2020-07-23 (×3): 50 mg via ORAL
  Filled 2020-07-21 (×3): qty 1

## 2020-07-21 MED ORDER — LIDOCAINE 2% (20 MG/ML) 5 ML SYRINGE
INTRAMUSCULAR | Status: AC
  Filled 2020-07-21: qty 5

## 2020-07-21 MED ORDER — DIPHENHYDRAMINE HCL 50 MG/ML IJ SOLN
INTRAMUSCULAR | Status: AC | PRN
  Administered 2020-07-21: 50 mg via INTRAVENOUS

## 2020-07-21 MED ORDER — PROMETHAZINE HCL 25 MG/ML IJ SOLN: 6.2500 mg | INTRAMUSCULAR | Status: DC | PRN

## 2020-07-21 MED ORDER — FENTANYL CITRATE (PF) 100 MCG/2ML IJ SOLN
INTRAMUSCULAR | Status: AC | PRN
  Administered 2020-07-21: 50 ug via INTRAVENOUS

## 2020-07-21 MED ORDER — PHENYLEPHRINE 40 MCG/ML (10ML) SYRINGE FOR IV PUSH (FOR BLOOD PRESSURE SUPPORT)
PREFILLED_SYRINGE | INTRAVENOUS | Status: AC
  Filled 2020-07-21: qty 20

## 2020-07-21 MED ORDER — ACETAMINOPHEN 500 MG PO TABS
1000.0000 mg | ORAL_TABLET | Freq: Four times a day (QID) | ORAL | Status: DC
  Administered 2020-07-21 – 2020-07-24 (×14): 1000 mg via ORAL
  Filled 2020-07-21 (×14): qty 2

## 2020-07-21 MED ORDER — SODIUM CHLORIDE 0.9 % IR SOLN
Status: DC | PRN
  Administered 2020-07-21: 1000 mL

## 2020-07-21 MED ORDER — ORAL CARE MOUTH RINSE
15.0000 mL | Freq: Two times a day (BID) | OROMUCOSAL | Status: DC
  Administered 2020-07-23: 15 mL via OROMUCOSAL

## 2020-07-21 MED ORDER — MIDAZOLAM HCL 2 MG/2ML IJ SOLN
INTRAMUSCULAR | Status: AC
  Filled 2020-07-21: qty 2

## 2020-07-21 MED ORDER — SUCCINYLCHOLINE CHLORIDE 20 MG/ML IJ SOLN
INTRAMUSCULAR | Status: DC | PRN
  Administered 2020-07-21: 120 mg via INTRAVENOUS

## 2020-07-21 MED ORDER — ROCURONIUM BROMIDE 10 MG/ML (PF) SYRINGE
PREFILLED_SYRINGE | INTRAVENOUS | Status: DC | PRN
  Administered 2020-07-21: 30 mg via INTRAVENOUS

## 2020-07-21 MED ORDER — PHENYLEPHRINE 40 MCG/ML (10ML) SYRINGE FOR IV PUSH (FOR BLOOD PRESSURE SUPPORT)
PREFILLED_SYRINGE | INTRAVENOUS | Status: DC | PRN
  Administered 2020-07-21: 80 ug via INTRAVENOUS
  Administered 2020-07-21 (×3): 120 ug via INTRAVENOUS

## 2020-07-21 MED ORDER — MIDAZOLAM HCL 2 MG/2ML IJ SOLN
INTRAMUSCULAR | Status: AC | PRN
  Administered 2020-07-21 (×2): 2 mg via INTRAVENOUS
  Administered 2020-07-21: 1 mg via INTRAVENOUS

## 2020-07-21 MED ORDER — CYCLOBENZAPRINE HCL 10 MG PO TABS
10.0000 mg | ORAL_TABLET | Freq: Two times a day (BID) | ORAL | Status: DC | PRN
  Administered 2020-07-23: 10 mg via ORAL
  Filled 2020-07-21: qty 1

## 2020-07-21 MED ORDER — OXYCODONE HCL 5 MG PO TABS
5.0000 mg | ORAL_TABLET | ORAL | Status: DC | PRN
  Administered 2020-07-22 – 2020-07-24 (×6): 5 mg via ORAL
  Filled 2020-07-21 (×6): qty 1

## 2020-07-21 MED ORDER — FENTANYL CITRATE (PF) 100 MCG/2ML IJ SOLN
INTRAMUSCULAR | Status: DC | PRN
Start: 2020-07-21 — End: 2020-07-21
  Administered 2020-07-21 (×2): 50 ug via INTRAVENOUS
  Administered 2020-07-21: 100 ug via INTRAVENOUS

## 2020-07-21 MED ORDER — ACETAMINOPHEN 500 MG PO TABS
ORAL_TABLET | ORAL | Status: AC
  Filled 2020-07-21: qty 2

## 2020-07-21 MED ORDER — SODIUM CHLORIDE 0.9 % IR SOLN
Status: DC | PRN
  Administered 2020-07-21: 6000 mL

## 2020-07-21 MED ORDER — ONDANSETRON HCL 4 MG/2ML IJ SOLN
INTRAMUSCULAR | Status: AC
  Filled 2020-07-21: qty 2

## 2020-07-21 MED ORDER — DEXAMETHASONE SODIUM PHOSPHATE 10 MG/ML IJ SOLN
INTRAMUSCULAR | Status: DC | PRN
  Administered 2020-07-21: 10 mg via INTRAVENOUS

## 2020-07-21 MED ORDER — PROPOFOL 10 MG/ML IV BOLUS
INTRAVENOUS | Status: DC | PRN
  Administered 2020-07-21: 200 mg via INTRAVENOUS

## 2020-07-21 MED ORDER — FENTANYL CITRATE (PF) 100 MCG/2ML IJ SOLN
INTRAMUSCULAR | Status: AC
  Filled 2020-07-21: qty 4

## 2020-07-21 MED ORDER — MIDAZOLAM HCL (PF) 5 MG/ML IJ SOLN
INTRAMUSCULAR | Status: AC
  Filled 2020-07-21: qty 2

## 2020-07-21 MED ORDER — LIDOCAINE 2% (20 MG/ML) 5 ML SYRINGE
INTRAMUSCULAR | Status: DC | PRN
  Administered 2020-07-21: 100 mg via INTRAVENOUS

## 2020-07-21 MED ORDER — LIDOCAINE HCL (PF) 1 % IJ SOLN
INTRAMUSCULAR | Status: AC | PRN
  Administered 2020-07-21: 10 mL

## 2020-07-21 MED ORDER — SODIUM CHLORIDE 0.9 % IR SOLN
3000.0000 mL | Status: DC
  Administered 2020-07-21: 3000 mL

## 2020-07-21 MED ORDER — SUGAMMADEX SODIUM 200 MG/2ML IV SOLN
INTRAVENOUS | Status: DC | PRN
  Administered 2020-07-21: 326.4 mg via INTRAVENOUS

## 2020-07-21 SURGICAL SUPPLY — 27 items
BAG URINE DRAIN 2000ML AR STRL (UROLOGICAL SUPPLIES) ×4 IMPLANT
BAG URO CATCHER STRL LF (MISCELLANEOUS) ×4 IMPLANT
BASKET ZERO TIP NITINOL 2.4FR (BASKET) IMPLANT
CATH FOLEY 3WAY 30CC 22FR (CATHETERS) ×4 IMPLANT
CATH URET 5FR 28IN OPEN ENDED (CATHETERS) ×4 IMPLANT
CLOTH BEACON ORANGE TIMEOUT ST (SAFETY) ×4 IMPLANT
EXTRACTOR STONE NITINOL NGAGE (UROLOGICAL SUPPLIES) IMPLANT
GLOVE BIO SURGEON STRL SZ 6 (GLOVE) ×4 IMPLANT
GLOVE BIO SURGEON STRL SZ7 (GLOVE) ×4 IMPLANT
GLOVE BIOGEL M STRL SZ7.5 (GLOVE) ×4 IMPLANT
GLOVE BIOGEL PI IND STRL 6.5 (GLOVE) ×2 IMPLANT
GLOVE BIOGEL PI IND STRL 7.5 (GLOVE) ×2 IMPLANT
GLOVE BIOGEL PI INDICATOR 6.5 (GLOVE) ×2
GLOVE BIOGEL PI INDICATOR 7.5 (GLOVE) ×2
GOWN STRL REUS W/ TWL LRG LVL3 (GOWN DISPOSABLE) ×4 IMPLANT
GOWN STRL REUS W/TWL LRG LVL3 (GOWN DISPOSABLE) ×4
GOWN STRL REUS W/TWL XL LVL3 (GOWN DISPOSABLE) ×4 IMPLANT
GUIDEWIRE ZIPWRE .038 STRAIGHT (WIRE) ×4 IMPLANT
KIT TURNOVER KIT A (KITS) ×4 IMPLANT
LOOP CUT BIPOLAR 24F LRG (ELECTROSURGICAL) ×4 IMPLANT
MANIFOLD NEPTUNE II (INSTRUMENTS) ×4 IMPLANT
PACK CYSTO (CUSTOM PROCEDURE TRAY) ×4 IMPLANT
SHEATH URETERAL 12FRX35CM (MISCELLANEOUS) ×4 IMPLANT
STENT URET 6FRX26 CONTOUR (STENTS) IMPLANT
TUBING CONNECTING 10 (TUBING) ×3 IMPLANT
TUBING CONNECTING 10' (TUBING) ×1
TUBING UROLOGY SET (TUBING) ×4 IMPLANT

## 2020-07-21 NOTE — Progress Notes (Signed)
S: Called to bedside by NSG for new rigors, increasing O2 req s/p IR TRUS drain into area of prostatic abscess.  O: HR160s, T 105 AOx3, No respiratory distress Diaphoretic with rigors Large chest tattoo SNTND Foley in place with yellow urine / scant debris. TRUS drain in place with serosanguinous outptu that is non-foul  UCX,BCX this admission pending  On Levaquin + Cefapime (accoriding to priro UCX e.coli sensitivity)  A/P: Sepsis with ARDS progressed from likely prostate abscess. Will need critical care help and ICU transfer to manage ARDS which is beyond my scope of practice.

## 2020-07-21 NOTE — Progress Notes (Deleted)
Patient returned from IR, drain in place and intact. Foley cathter intact, patent,Urine pink/red.

## 2020-07-21 NOTE — Consult Note (Signed)
Chief Complaint: Prostate abscess. Request is for prostate abscess drain placement.  Referring Physician(s): Dr. Orbie Hurst  Supervising Physician: Malachy Moan  Patient Status: [facility] - In-pt  History of Present Illness: Tyler [name] is a 34 y.o. male History of  E. Coli UTI. Patient presented to the ED at [facility] with pelvic pain and fevers. Patient was found to have a prostate abscess. CT abdomen pelvis from 10.5.21 reads Multilocular fluid collection, likely abscess,within the perineal soft tissues though to be extending from the prostate base and tracking into the bulb of the penile soft tissues, right greater than left ,measuring 2.9 x 4.1 x 4.8 cm. On 10.5.21 Urology attempted at a transurethral unroofing however they were unable to visualize the abscess. Team is requesting a IR image guided prostate abscess drain placement.   History reviewed. No pertinent past medical history.  History reviewed. No pertinent surgical history.  Allergies: Patient has no known allergies.  Medications: Prior to Admission medications   Medication Sig Start Date End Date Taking? Authorizing Provider  cyclobenzaprine (FLEXERIL) 10 MG tablet Take 1 tablet (10 mg total) by mouth 2 (two) times daily as needed for muscle spasms. Patient not taking: Reported on 07/20/2020 11/15/12   Fayrene Helper, PA-C  HYDROcodone-acetaminophen (NORCO/VICODIN) 5-325 MG per tablet Take 2 tablets by mouth every 4 (four) hours as needed for pain. Patient not taking: Reported on 07/20/2020 11/15/12   Fayrene Helper, PA-C  ibuprofen (ADVIL) 600 MG tablet Take 1 tablet (600 mg total) by mouth every 6 (six) hours as needed. Patient not taking: Reported on 07/20/2020 05/01/19   Fayrene Helper, PA-C  traZODone (DESYREL) 50 MG tablet Take 50 mg by mouth at bedtime. 07/05/20   [provider]     History reviewed. No pertinent family history.  Social History   Socioeconomic History  . Marital status: Single    Spouse name:  Not on file  . Number of children: Not on file  . Years of education: Not on file  . Highest education level: Not on file  Occupational History  . Not on file  Tobacco Use  . Smoking status: Current Every Day Smoker    Packs/day: 1.00    Types: Cigarettes  Substance and Sexual Activity  . Alcohol use: Yes  . Drug use: Yes    Types: Marijuana  . Sexual activity: Not on file  Other Topics Concern  . Not on file  Social History Narrative  . Not on file   Social Determinants of Health   Financial Resource Strain:   . Difficulty of Paying Living Expenses: Not on file  Food Insecurity:   . Worried About Programme researcher, broadcasting/film/video in the Last Year: Not on file  . Ran Out of Food in the Last Year: Not on file  Transportation Needs:   . Lack of Transportation (Medical): Not on file  . Lack of Transportation (Non-Medical): Not on file  Physical Activity:   . Days of Exercise per Week: Not on file  . Minutes of Exercise per Session: Not on file  Stress:   . Feeling of Stress : Not on file  Social Connections:   . Frequency of Communication with Friends and Family: Not on file  . Frequency of Social Gatherings with Friends and Family: Not on file  . Attends Religious Services: Not on file  . Active Member of Clubs or Organizations: Not on file  . Attends Banker Meetings: Not on file  . Marital Status: Not  on file     Review of Systems: A 12 point ROS discussed and pertinent positives are indicated in the HPI above.  All other systems are negative.  Review of Systems  Constitutional: Negative for fever.  HENT: Negative for congestion.   Respiratory: Negative for cough and shortness of breath.   Cardiovascular: Negative for chest pain.  Gastrointestinal: Negative for abdominal pain.  Genitourinary:       Pelvic pain.  Neurological: Negative for headaches.  Psychiatric/Behavioral: Positive for agitation. Negative for behavioral problems and confusion.    Vital  Signs: BP (!) 143/93 (BP Location: Left Arm)   Pulse (!) 102   Temp 97.9 F (36.6 C) (Oral)   Resp 20   Ht 6' (1.829 m)   Wt 189 lb 6.4 oz (85.9 kg)   SpO2 100%   BMI 25.69 kg/m   Physical Exam Vitals and nursing note reviewed.  Constitutional:      Appearance: He is well-developed.  HENT:     Head: Normocephalic.  Cardiovascular:     Rate and Rhythm: Regular rhythm. Tachycardia present.     Heart sounds: Normal heart sounds.  Pulmonary:     Effort: Pulmonary effort is normal.     Breath sounds: Normal breath sounds.  Musculoskeletal:        General: Normal range of motion.     Cervical back: Normal range of motion.  Skin:    General: Skin is dry.  Neurological:     Mental Status: He is alert and oriented to person, place, and time.     Imaging: CT ABDOMEN PELVIS W CONTRAST  Result Date: 07/20/2020 CLINICAL DATA:  Abdominal pain, perineal pain question of abscess EXAM: CT ABDOMEN AND PELVIS WITH CONTRAST TECHNIQUE: Multidetector CT imaging of the abdomen and pelvis was performed using the standard protocol following bolus administration of intravenous contrast. CONTRAST:  OMNIPAQUE IOHEXOL 300 MG/ML  SOLN COMPARISON:  None. FINDINGS: Lower chest: The visualized heart size within normal limits. No pericardial fluid/thickening. No hiatal hernia. The visualized portions of the lungs are clear. Hepatobiliary: The liver is normal in density without focal abnormality.The main portal vein is patent. No evidence of calcified gallstones, gallbladder wall thickening or biliary dilatation. Pancreas: Unremarkable. No pancreatic ductal dilatation or surrounding inflammatory changes. Spleen: Normal in size without focal abnormality. Adrenals/Urinary Tract: Both adrenal glands appear normal. The kidneys and collecting system appear normal without evidence of urinary tract calculus or hydronephrosis. Bladder is partially decompressed with apparent diffuse wall thickening. Stomach/Bowel:  The stomach, small bowel, and colon are normal in appearance. No inflammatory changes, wall thickening, or obstructive findings.The appendix is normal. Vascular/Lymphatic: There are no enlarged mesenteric, retroperitoneal, or pelvic lymph nodes. No significant vascular findings are present. Reproductive: Within the base of the prostate gland extending into the inferior peroneal soft tissues and base of the penile bulb, right greater than left there is a multilocular fluid collection measuring approximately 2.9 x 4.1 by 4.8 cm. Other: No hernia or abdominal wall tissue mass. No subcutaneous emphysema seen within the soft tissues. Musculoskeletal: No acute or significant osseous findings. IMPRESSION: 1. Multilocular fluid collection, likely abscess,within the perineal soft tissues though to be extending from the prostate base and tracking into the bulb of the penile soft tissues, right greater than left ,measuring 2.9 x 4.1 x 4.8 cm. 2. -Bladder is partially decompressed with apparent wall thickening which could be due to acute cystitis versus physiologic under distension. Electronically Signed   By: Heywood Bene.D.  On: 07/20/2020 20:42   DG Chest Port 1 View  Result Date: 07/20/2020 CLINICAL DATA:  Suspected Sepsis Tachycardia, fatigue and loss of consciousness. Nausea and vomiting. EXAM: PORTABLE CHEST 1 VIEW COMPARISON:  None. FINDINGS: The cardiomediastinal contours are normal. The lungs are clear. Pulmonary vasculature is normal. No consolidation, pleural effusion, or pneumothorax. No acute osseous abnormalities are seen. IMPRESSION: Negative portable AP view of the chest. Electronically Signed   By: Narda Rutherford M.D.   On: 07/20/2020 18:57    Labs:  CBC: Recent Labs    07/20/20 1822 07/21/20 0508  WBC 3.5* 24.2*  HGB 15.0 11.5*  HCT 44.7 34.0*  PLT 209 141*    COAGS: Recent Labs    07/20/20 1822 07/20/20 1844  INR 1.0  --   APTT  --  25    BMP: Recent Labs    07/20/20 1822  07/21/20 0508  NA 136 136  K 4.2 4.1  CL 97* 102  CO2 24 23  GLUCOSE 131* 132*  BUN 13 18  CALCIUM 10.0 8.1*  CREATININE 1.87* 1.57*  GFRNONAA 47* 57*  GFRAA 54* >60    LIVER FUNCTION TESTS: Recent Labs    07/20/20 1822  BILITOT 0.9  AST 49*  ALT 25  ALKPHOS 105  PROT 9.2*  ALBUMIN 3.9    Assessment and Plan: 34 y.o. male inpatient. History of  E. Coli UTI. Patient presented to the ED at [facility] with pelvic pain and fevers. Patient was found to have a prostate abscess. CT abdomen pelvis from 10.5.21 reads Multilocular fluid collection, likely abscess,within the perineal soft tissues though to be extending from the prostate base and tracking into the bulb of the penile soft tissues, right greater than left ,measuring 2.9 x 4.1 x 4.8 cm. On 10.5.21 Urology attempted at a transurethral unroofing however they were unable to visualize the abscess. Team is requesting a IR image guided prostate abscess drain placement.   WBC is 24.2, Cr 1.57. All other labs and medications are within acceptable parameters. NKDA. Patient is afebrile and has been NPO since midnight.  IR consulted for possible prostate abscess drain placement. Case has been reviewed and procedure approved by Dr. Archer Asa.  Patient tentatively scheduled for 10.5.21.  Team instructed to: Keep Patient to be NPO after midnight  IR will call patient when ready.  Risks and benefits discussed with the patient including bleeding, infection, damage to adjacent structures, bowel perforation/fistula connection, and sepsis.  All of the patient's questions were answered, patient is agreeable to proceed. Consent signed and in chart.     Thank you for this interesting consult.  I greatly enjoyed meeting Renard Caperton and look forward to participating in their care.  A copy of this report was sent to the requesting provider on this date.  Electronically Signed: Alene Mires, NP 07/21/2020, 12:01 PM   I spent a total of  40 Minutes    in face to face in clinical consultation, greater than 50% of which was counseling/coordinating care for prostate abscess drain placement.

## 2020-07-21 NOTE — Consult Note (Signed)
NAME:  Tyler [name], MRN:  [id number], DOB:  10-Feb-1986, LOS: 0 ADMISSION DATE:  07/20/2020, CONSULTATION DATE:  07/21/20 REFERRING MD:  Dr Berneice Heinrich, Urology, CHIEF COMPLAINT:  Hypoxemia, severe sepsis   Brief History   34 year old man admitted with recent E. coli UTI, evaluated in the ED 10/4 for pelvic pain, fever.  Found to have a 2.9 x 4.1 x 4.8 prostatic abscess on CT.  This was not reachable by transurethral resection, underwent IR ends urethral ultrasound drain into the prostatic abscess on 10/5.  Noted to have high fever, rigors post procedure. O2 needs increased from 2L/min to 6L/min. Was treated with cefepime on presentation, changed to levofloxacin and moved to the ICU for closer monitoring post-procedure 10/5.   Past Medical History  History reviewed. No pertinent past medical history.   Significant Hospital Events   To ICU with severe sepsis 10/5  Consults:  Urology  Procedures:  Transurethral prostate resection 10/4 IR transurethral prostatic abscess drain placement 10/5 >>   Significant Diagnostic Tests:  Chest x-ray 10/4 >> clear CT abdomen and pelvis with contrast 10/4 >> multilocular fluid collection in the perineal soft tissues through to the prostate base tracking into the bulb 2.9 x 4.1 x 4.8 cm.  Bladder wall thickening  Micro Data:  Prior urine 05/01/2019 >> E. Coli (R amp, amp/sulbactam, bactrim) Blood culture 10/4 >>  Urine culture 10/4 >>  Abscess culture 10/5 >>   Antimicrobials:  Cefepime 10/4 x1 Levofloxacin 10/5 >>   Interim history/subjective:   Patient is hungry  Objective   Blood pressure 129/64, pulse (!) 119, temperature (!) 103.1 F (39.5 C), temperature source Rectal, resp. rate 17, height 6' (1.829 m), weight 85.9 kg, SpO2 95 %.        Intake/Output Summary (Last 24 hours) at 07/21/2020 1813 Last data filed at 07/21/2020 1600 Gross per 24 hour  Intake 13513.42 ml  Output 9635 ml  Net 3878.42 ml   Filed Weights   07/20/20 1820  07/21/20 0326  Weight: 81.6 kg 85.9 kg    Examination: General: Well-developed man, no distress, mild diaphoresis HENT: Oropharynx clear, no lesions Lungs: Clear bilaterally Cardiovascular: Mild tachycardia, regular, no murmur Abdomen: Mildly tender to palpation, no guarding, positive bowel sounds drain in place with sanguinous fluid Extremities: No edema Neuro: Awake, alert, appropriate, nonfocal GU: Blood-tinged yellow urine.  Resolved Hospital Problem list     Assessment & Plan:  Severe sepsis due to prostatic abscess.  Status post transurethral drain in IR 10/5. -Agree with levofloxacin -Follow urine and abscess culture data -Repeat lactic acid -Consider follow procalcitonin for clearance, given clinical scenario will certainly be elevated -Urology and IR management  Acute hypoxemic respiratory failure with increasing oxygen requirement.  At risk for acute lung injury given his sepsis -Chest x-ray now -Stop IV fluid bolus as he is hemodynamically stable, do not want to contribute to pulmonary volume overload -Pulmonary hygiene -Wean oxygen as able.  Depending on progression of his hypoxemia, infiltrates may need to consider other support including BiPAP, even MV depending on course.  Acute renal failure.  Improving -Follow urine output, BMP for improvement with restoration of adequate perfusion pressures.    Best practice:  Diet: start diet if ok w urology Pain/Anxiety/Delirium protocol (if indicated):oxycodone  VAP protocol (if indicated): NA DVT prophylaxis: SCD GI prophylaxis: PPI Glucose control: NA Mobility: BR Code Status: Full Family Communication: primary service Disposition: SDU  Labs   CBC: Recent Labs  Lab 07/20/20 1822 07/21/20 0508  WBC  3.5* 24.2*  NEUTROABS 2.8  --   HGB 15.0 11.5*  HCT 44.7 34.0*  MCV 82.8 82.7  PLT 209 141*    Basic Metabolic Panel: Recent Labs  Lab 07/20/20 1822 07/21/20 0508  NA 136 136  K 4.2 4.1  CL 97* 102    CO2 24 23  GLUCOSE 131* 132*  BUN 13 18  CREATININE 1.87* 1.57*  CALCIUM 10.0 8.1*   GFR: Estimated Creatinine Clearance: 74.1 mL/min (A) (by C-G formula based on SCr of 1.57 mg/dL (H)). Recent Labs  Lab 07/20/20 1822 07/20/20 2022 07/21/20 0508  WBC 3.5*  --  24.2*  LATICACIDVEN 4.2* 4.1* 1.9    Liver Function Tests: Recent Labs  Lab 07/20/20 1822  AST 49*  ALT 25  ALKPHOS 105  BILITOT 0.9  PROT 9.2*  ALBUMIN 3.9   No results for input(s): LIPASE, AMYLASE in the last 168 hours. No results for input(s): AMMONIA in the last 168 hours.  ABG No results found for: PHART, PCO2ART, PO2ART, HCO3, TCO2, ACIDBASEDEF, O2SAT   Coagulation Profile: Recent Labs  Lab 07/20/20 1822  INR 1.0    Cardiac Enzymes: No results for input(s): CKTOTAL, CKMB, CKMBINDEX, TROPONINI in the last 168 hours.  HbA1C: No results found for: HGBA1C  CBG: No results for input(s): GLUCAP in the last 168 hours.  Review of Systems:   As per [facility]  Past Medical History  He,  has no past medical history on file.   Surgical History   History reviewed. No pertinent surgical history.   Social History   reports that he has been smoking cigarettes. He has been smoking about 1.00 pack per day. He has never used smokeless tobacco. He reports current alcohol use. He reports current drug use. Drug: Marijuana.   Family History   His family history is not on file.   Allergies No Known Allergies   Home Medications  Prior to Admission medications   Medication Sig Start Date End Date Taking? Authorizing Provider  cyclobenzaprine (FLEXERIL) 10 MG tablet Take 1 tablet (10 mg total) by mouth 2 (two) times daily as needed for muscle spasms. Patient not taking: Reported on 07/20/2020 11/15/12   Fayrene Helper, PA-C  HYDROcodone-acetaminophen (NORCO/VICODIN) 5-325 MG per tablet Take 2 tablets by mouth every 4 (four) hours as needed for pain. Patient not taking: Reported on 07/20/2020 11/15/12   Fayrene Helper,  PA-C  ibuprofen (ADVIL) 600 MG tablet Take 1 tablet (600 mg total) by mouth every 6 (six) hours as needed. Patient not taking: Reported on 07/20/2020 05/01/19   Fayrene Helper, PA-C  traZODone (DESYREL) 50 MG tablet Take 50 mg by mouth at bedtime. 07/05/20   [provider]     Critical care time: 73     Levy Pupa, MD, PhD 07/21/2020, 6:54 PM [facility] [phone] or if no answer [phone]

## 2020-07-21 NOTE — Progress Notes (Signed)
CRITICAL VALUE ALERT  Critical Value:  Lactic  Date & Time Notied:  07/21/2020 1856  Provider Notified: Dr. Delton Coombes  Orders Received/Actions taken: No new orders at this time. Per MD, continue to trend Lactic acid level

## 2020-07-21 NOTE — Significant Event (Signed)
Rapid Response Event Note   Reason for Call : Notified by 4th floor RN, (patient just came back from IR for prostate drain placement) that patient in rigors, tachypnea (RR mid 30s), HR 140s ST, BP 140s systolic.  Asked to obtain rectal temperature due to patient presenting with Rigors.   Initial Focused Assessment:  Neuro: Alert and oriented x 3, disoriented to situation. Patient did receive sedation during IR procedure. Able to move all extremities. Rectal temperature 105 F. Rigors have resolved upon arrival, patient diaphoretic. Cardiac: ST HR 140s, BP see vital signs  Pulmonary: RR mid 30s, no apparent respiratory distress.   Interventions:  Scheduled tylenol given Fluid bolus given by IR RN Lactic Acid ordered per Sepsis/Rapid Response Protocol   Unable to obtain MD Winter per page, 4th floor RN notified MD [facility].   Plan of Care:  Transfer patient to ICU/SD for closer monitoring    Event Summary:   MD Notified: MD Manny  Call Time: see 4th floor RN note Arrival Time:  see 4th floor RN note End Time:  see 4th floor RN note  Ryun Velez C, RN

## 2020-07-21 NOTE — Op Note (Signed)
Pre-operative diagnosis:  1. Prostate abscess  Post-operative diagnosis:  1. Prostate/periurethral abscess  Procedure:  1. Cystoscopy 2. Transurethral resection of prostate  Surgeon: Rhoderick Moody, MD  Resident Surgeon: Margette Fast, MD  Anesthesia: General  Complications: None  Intraoperative findings:   - Normal appearing penile and prostatic urethra. Necrotic bulbar urethra - Normal bladder with orthotopic UOs. No bladder stones, tumors, or masses - No abscess cavity was found with resection of lateral and median prostatic lobes, but the prostate tissue appeared somewhat necrotic. A small tear was made in the right lateral capsule during resection - Successful placement of 20 Fr 3-way catheter and initiation of CBI - Physical exam with no palpable periurethral induration or fluid collection that could easily be drained  EBL: 10cc  Specimens: Prostate chips   Indication: Tyler [name] is a 34 y.o. male with history of E Coli UTI who presents to the ED with pelvic pain, fevers, and imaging demonstrating a 2.9 x 4.1 x 4.8 cm prostate abscess. Clinically well-appearing with only mild prostate bogginess/tenderness on exam but with vitals and labs concerning for sepsis.   Given hemodynamic instability and imaging findings, we discussed proceeding with urgent transurethral unroofing of prostate abscess. The patient was in agreement with this plan. We have discussed the potential benefits and risks of the procedure, side effects of the proposed treatment, the likelihood of the patient achieving the goals of the procedure, and any potential problems that might occur during the procedure or recuperation. Informed consent has been obtained.  Description of procedure:  The patient was taken to the operating room, and general anesthesia was induced.  The patient was placed in the dorsal lithotomy position, prepped and draped in the usual sterile fashion, and preoperative antibiotics  were administered. A preoperative time-out was performed.   We inserted a rigid cystoscope per the urethral with copious lubrication and normal saline irrigation fluid. We performed pan cystourethroscopy with the findings noted above. We then switched to a resectoscope with bipolar loop and proceeded to resect the lateral and median prostatic lobes, which appeared somewhat necrotic. A small tear was made in the right lateral capsule during resection. We didn't encounter an abscess cavity, nor was any purulent drainage was noted. At this time, we decided to abort transurethral resection due to concern that the abscess cavity was more distal and transurethral resection would injure the external sphincter. We obtained good hemostasis of the prostatic resection bed.  We removed all prostate chips with the aid of a toomey syringe. We sent the chips for pathology. We removed the resectoscope. We again performed a physical exam with no palpable periurethral induration or fluid collection that could easily be drained. We placed a 20 Fr 3-way foley catheter per urethra, filled the balloon with 10cc sterile water, and connected it to CBI.   The patient appeared to tolerate the procedure well and without complications.  The patient was able to be awakened and transferred to the recovery unit in satisfactory condition.   Plan: - Admit to urology - Continue CBI; titrate to light pink. Continue foley catheter for 7 days for urethral healing - Continue levaquin. Follow up cultures and narrow/adjust antibiotics accordingly - Repeat CT pelvic with contrast in AM - Tentative plan to revisit IR percutaneous drainage of periurethral/prostate abscess pending repeat imaging

## 2020-07-21 NOTE — Anesthesia Preprocedure Evaluation (Signed)
Anesthesia Evaluation  Patient identified by MRN, date of birth, ID band Patient awake    Reviewed: Allergy & Precautions, NPO status , Patient's Chart, lab work & pertinent test results  Airway Mallampati: II  TM Distance: >3 FB Neck ROM: Full    Dental  (+) Chipped,    Pulmonary Current SmokerPatient did not abstain from smoking.,    Pulmonary exam normal breath sounds clear to auscultation       Cardiovascular negative cardio ROS   Rhythm:Regular Rate:Tachycardia     Neuro/Psych negative neurological ROS  negative psych ROS   GI/Hepatic negative GI ROS, Neg liver ROS,   Endo/Other  negative endocrine ROS  Renal/GU Renal InsufficiencyRenal disease     Musculoskeletal negative musculoskeletal ROS (+)   Abdominal   Peds  Hematology negative hematology ROS (+)   Anesthesia Other Findings Day of surgery medications reviewed with the patient.  Reproductive/Obstetrics                             Anesthesia Physical Anesthesia Plan  ASA: II and emergent  Anesthesia Plan: General   Post-op Pain Management:    Induction: Intravenous, Rapid sequence and Cricoid pressure planned  PONV Risk Score and Plan: 2 and Midazolam, Dexamethasone and Ondansetron  Airway Management Planned: Oral ETT  Additional Equipment:   Intra-op Plan:   Post-operative Plan: Extubation in OR  Informed Consent: I have reviewed the patients History and Physical, chart, labs and discussed the procedure including the risks, benefits and alternatives for the proposed anesthesia with the patient or authorized representative who has indicated his/her understanding and acceptance.       Plan Discussed with: CRNA  Anesthesia Plan Comments:         Anesthesia Quick Evaluation

## 2020-07-21 NOTE — H&P (Addendum)
Please refer to consult note from today for H&P.

## 2020-07-21 NOTE — Anesthesia Postprocedure Evaluation (Signed)
Anesthesia Post Note  Patient: Tyler [name]  Procedure(s) Performed: TRANSURETHRAL RESECTION OF THE PROSTATE (TURP) (Prostate)     Patient location during evaluation: PACU Anesthesia Type: General Level of consciousness: awake Pain management: pain level controlled Vital Signs Assessment: post-procedure vital signs reviewed and stable Respiratory status: spontaneous breathing, nonlabored ventilation, respiratory function stable and patient connected to nasal cannula oxygen Cardiovascular status: blood pressure returned to baseline, stable and tachycardic Postop Assessment: no apparent nausea or vomiting Anesthetic complications: no   No complications documented.  Last Vitals:  Vitals:   07/21/20 0342 07/21/20 0534  BP: 134/66 127/78  Pulse: (!) 125 (!) 116  Resp: (!) 25 (!) 22  Temp: 36.9 C 37.1 C  SpO2: 95% 99%    Last Pain:  Vitals:   07/21/20 0541  TempSrc:   PainSc: 8                  Cecile Hearing

## 2020-07-21 NOTE — Transfer of Care (Signed)
Immediate Anesthesia Transfer of Care Note  Patient: Tyler [name]  Procedure(s) Performed: TRANSURETHRAL RESECTION OF THE PROSTATE (TURP) (Prostate)  Patient Location: PACU  Anesthesia Type:General  Level of Consciousness: drowsy and patient cooperative  Airway & Oxygen Therapy: Patient Spontanous Breathing and Patient connected to face mask oxygen  Post-op Assessment: Report given to RN and Post -op Vital signs reviewed and stable  Post vital signs: Reviewed and stable  Last Vitals:  Vitals Value Taken Time  BP 95/53 07/21/20 0218  Temp    Pulse 122 07/21/20 0224  Resp 16 07/21/20 0224  SpO2 93 % 07/21/20 0224  Vitals shown include unvalidated device data.  Last Pain:  Vitals:   07/20/20 2341  TempSrc: Oral  PainSc:          Complications: No complications documented.

## 2020-07-21 NOTE — Progress Notes (Signed)
   07/21/20 0342  Assess: MEWS Score  Temp 98.5 F (36.9 C)  BP 134/66  Pulse Rate (!) 125  Resp (!) 25  Level of Consciousness Alert  SpO2 95 %  O2 Device Nasal Cannula  Assess: MEWS Score  MEWS Temp 0  MEWS Systolic 0  MEWS Pulse 2  MEWS RR 1  MEWS LOC 0  MEWS Score 3  MEWS Score Color Yellow  Assess: if the MEWS score is Yellow or Red  Were vital signs taken at a resting state? Yes  Focused Assessment No change from prior assessment (Patient was red in PACU)  Early Detection of Sepsis Score *See Row Information* Medium  MEWS guidelines implemented *See Row Information* Yes  Treat  MEWS Interventions Administered scheduled meds/treatments  Take Vital Signs  Increase Vital Sign Frequency  Yellow: Q 2hr X 2 then Q 4hr X 2, if remains yellow, continue Q 4hrs  Escalate  MEWS: Escalate Yellow: discuss with charge nurse/RN and consider discussing with provider and RRT  Notify: Charge Nurse/RN  Name of Charge Nurse/RN Notified Eula Fried, RN  Date Charge Nurse/RN Notified 07/21/20  Time Charge Nurse/RN Notified [phone]  Document  Progress note created (see row info) Yes   Patient was RED MEWs in PACU due to fever and elevated heart rate, treated in PACU. Now Yellow MEWs protocol initiated, will continue to assess patient.

## 2020-07-21 NOTE — TOC Initial Note (Signed)
Transition of Care [facility]) - Initial/Assessment Note    Patient Details  Name: Tyler [name] MRN: [id number] Date of Birth: January 19, 1986  [facility]) CM/SW Contact:    Lanier Clam, RN Phone Number: 07/21/2020, 11:53 AM  Clinical Narrative:  Spoke to patient about d/c plans. Provided w/pcp resource list,insurance resource. Patient has own tranport home.                 Expected Discharge Plan: Home/Self Care Barriers to Discharge: Continued Medical Work up   Patient Goals and CMS Choice Patient states their goals for this hospitalization and ongoing recovery are:: go home CMS Medicare.gov Compare Post Acute Care list provided to:: Patient Choice offered to / list presented to : NA  Expected Discharge Plan and Services Expected Discharge Plan: Home/Self Care   Discharge Planning Services: CM Consult Post Acute Care Choice: NA Living arrangements for the past 2 months: Single Family Home                                      Prior Living Arrangements/Services Living arrangements for the past 2 months: Single Family Home Lives with:: Parents Patient language and need for interpreter reviewed:: Yes Do you feel safe going back to the place where you live?: Yes        Care giver support system in place?: Yes (comment)   Criminal Activity/Legal Involvement Pertinent to Current Situation/Hospitalization: No - Comment as needed  Activities of Daily Living Home Assistive Devices/Equipment: Eyeglasses ADL Screening (condition at time of admission) Patient's cognitive ability adequate to safely complete daily activities?: Yes Is the patient deaf or have difficulty hearing?: No Does the patient have difficulty seeing, even when wearing glasses/contacts?: No Does the patient have difficulty concentrating, remembering, or making decisions?: No Patient able to express need for assistance with ADLs?: Yes Does the patient have difficulty dressing or bathing?:  No Independently performs ADLs?: Yes (appropriate for developmental age) Does the patient have difficulty walking or climbing stairs?: No Weakness of Legs: None Weakness of Arms/Hands: None  Permission Sought/Granted Permission sought to share information with : Case Manager Permission granted to share information with : Yes, Verbal Permission Granted  Share Information with NAME: Case manager           Emotional Assessment Appearance:: Appears stated age Attitude/Demeanor/Rapport: Gracious Affect (typically observed): Accepting Orientation: : Oriented to Self, Oriented to Place, Oriented to  Time, Oriented to Situation Alcohol / Substance Use: Not Applicable Psych Involvement: No (comment)  Admission diagnosis:  Prostate abscess [N41.2] Sepsis (HCC) [A41.9] Sepsis without acute organ dysfunction, due to unspecified organism [facility]) [A41.9] Patient Active Problem List   Diagnosis Date Noted  . Prostate abscess 07/21/2020  . AKI (acute kidney injury) (HCC) 07/20/2020  . Abscess of prostate 07/20/2020   PCP:  Patient, No Pcp Per Pharmacy:   [id number] - , [place] - [address] AT [facility] [address] [place] [address] Phone: [phone] Fax: [phone]     Social Determinants of Health (SDOH) Interventions    Readmission Risk Interventions No flowsheet data found.

## 2020-07-21 NOTE — Progress Notes (Addendum)
Day of Surgery s/p unsuccessful transurethral unroofing of prostatic abscess  Subjective: AF since 3am this morning. Persistently tachycardic to 100-120s and tachypneic, normotensive. Continues to report persistent pelvic/rectal pain c/w unsuccessful transurethral unroofing of prostate abscess. Urine has cleared to yellow; CBI now clamped. Lactate normalized. Now with leukocytosis. Cr downtrending.  Objective: Vital signs in last 24 hours: Temp:  [98.5 F (36.9 C)-103.1 F (39.5 C)] 98.7 F (37.1 C) (10/05 0744) Pulse Rate:  [109-164] 109 (10/05 0744) Resp:  [9-38] 20 (10/05 0744) BP: (91-150)/(51-90) 127/77 (10/05 0744) SpO2:  [82 %-100 %] 96 % (10/05 0744) Weight:  [81.6 kg-85.9 kg] 85.9 kg (10/05 0326)  Intake/Output from previous day: 10/04 0701 - 10/05 0700 In: 9804.5 [P.O.:200; I.V.:1754.5; IV Piggyback:3750] Out: 3635 [Urine:3625; Blood:10] Intake/Output this shift: No intake/output data recorded.  Physical Exam:  General: Alert and oriented, appears comfortable CV: Tachycardic Lungs: NWOB on RA Abdomen: Soft, nontender, nondistended GU: Foley catheter draining clear yellow urine with CBI clamped  Ext: NT, No erythema  Lab Results: Recent Labs    07/20/20 1822 07/21/20 0508  HGB 15.0 11.5*  HCT 44.7 34.0*   BMET Recent Labs    07/20/20 1822 07/21/20 0508  NA 136 136  K 4.2 4.1  CL 97* 102  CO2 24 23  GLUCOSE 131* 132*  BUN 13 18  CREATININE 1.87* 1.57*  CALCIUM 10.0 8.1*     Studies/Results: CT ABDOMEN PELVIS W CONTRAST  Result Date: 07/20/2020 CLINICAL DATA:  Abdominal pain, perineal pain question of abscess EXAM: CT ABDOMEN AND PELVIS WITH CONTRAST TECHNIQUE: Multidetector CT imaging of the abdomen and pelvis was performed using the standard protocol following bolus administration of intravenous contrast. CONTRAST:  OMNIPAQUE IOHEXOL 300 MG/ML  SOLN COMPARISON:  None. FINDINGS: Lower chest: The visualized heart size within normal limits. No  pericardial fluid/thickening. No hiatal hernia. The visualized portions of the lungs are clear. Hepatobiliary: The liver is normal in density without focal abnormality.The main portal vein is patent. No evidence of calcified gallstones, gallbladder wall thickening or biliary dilatation. Pancreas: Unremarkable. No pancreatic ductal dilatation or surrounding inflammatory changes. Spleen: Normal in size without focal abnormality. Adrenals/Urinary Tract: Both adrenal glands appear normal. The kidneys and collecting system appear normal without evidence of urinary tract calculus or hydronephrosis. Bladder is partially decompressed with apparent diffuse wall thickening. Stomach/Bowel: The stomach, small bowel, and colon are normal in appearance. No inflammatory changes, wall thickening, or obstructive findings.The appendix is normal. Vascular/Lymphatic: There are no enlarged mesenteric, retroperitoneal, or pelvic lymph nodes. No significant vascular findings are present. Reproductive: Within the base of the prostate gland extending into the inferior peroneal soft tissues and base of the penile bulb, right greater than left there is a multilocular fluid collection measuring approximately 2.9 x 4.1 by 4.8 cm. Other: No hernia or abdominal wall tissue mass. No subcutaneous emphysema seen within the soft tissues. Musculoskeletal: No acute or significant osseous findings. IMPRESSION: 1. Multilocular fluid collection, likely abscess,within the perineal soft tissues though to be extending from the prostate base and tracking into the bulb of the penile soft tissues, right greater than left ,measuring 2.9 x 4.1 x 4.8 cm. 2. -Bladder is partially decompressed with apparent wall thickening which could be due to acute cystitis versus physiologic under distension. Electronically Signed   By: Jonna Clark M.D.   On: 07/20/2020 20:42   DG Chest Port 1 View  Result Date: 07/20/2020 CLINICAL DATA:  Suspected Sepsis Tachycardia,  fatigue and loss of consciousness. Nausea and  vomiting. EXAM: PORTABLE CHEST 1 VIEW COMPARISON:  None. FINDINGS: The cardiomediastinal contours are normal. The lungs are clear. Pulmonary vasculature is normal. No consolidation, pleural effusion, or pneumothorax. No acute osseous abnormalities are seen. IMPRESSION: Negative portable AP view of the chest. Electronically Signed   By: Narda Rutherford M.D.   On: 07/20/2020 18:57    Assessment/Plan: Tyler [name] is a 34 y.o. male with history of E Coli UTI who presented to the ED on 10/4 with pelvic pain, fevers, and imaging demonstrating a 2.9 x 4.1 x 4.8 cm prostate abscess, now s/p unsuccessful transurethral unroofing of prostate abscess on 10/5 but clinically improved with broad spectrum antibiotics.  - Revisited case with IR, who plan to perform percutaneous/transrectal drainage of abscess this morning. They don't need new interval imaging prior to their procedure, so CT cancelled  - Keep NPO for IR procedure today  - Continue levaquin. Follow up urine and blood cultures. Narrow/adjust antibiotics accordingly - Continue foley catheter for 7 days for urethral healing. Keep CBI clamped as long as urine remains clear   LOS: 1 day   Margette Fast 07/21/2020, 8:07 AM

## 2020-07-21 NOTE — Anesthesia Procedure Notes (Signed)
Procedure Name: Intubation Date/Time: 07/21/2020 1:22 AM Performed by: Montel Clock, CRNA Pre-anesthesia Checklist: Patient identified, Emergency Drugs available, Suction available, Patient being monitored and Timeout performed Patient Re-evaluated:Patient Re-evaluated prior to induction Oxygen Delivery Method: Circle system utilized Preoxygenation: Pre-oxygenation with 100% oxygen Induction Type: IV induction, Rapid sequence and Cricoid Pressure applied Laryngoscope Size: Mac and 3 Grade View: Grade II Tube type: Oral Tube size: 7.5 mm Number of attempts: 1 Airway Equipment and Method: Stylet Placement Confirmation: ETT inserted through vocal cords under direct vision,  positive ETCO2 and breath sounds checked- equal and bilateral Secured at: 24 cm Tube secured with: Tape Dental Injury: Teeth and Oropharynx as per pre-operative assessment  Comments: MAC 3 not long enough. ETT passed though cords, blade inserted deep. Recommend MAC 4.

## 2020-07-21 NOTE — Progress Notes (Signed)
Pharmacy Antibiotic Note  Tyler [name] is a 34 y.o. male admitted on 07/20/2020 with hx of UTI presents with  Prostate abscess. Pharmacy has been consulted for levaquin dosing.  Plan: levaquin 750mg  IV q24h Follow renal function and ability to take po  Height: 6' (182.9 cm) Weight: 81.6 kg (180 lb) IBW/kg (Calculated) : 77.6  Temp (24hrs), Avg:102.1 F (38.9 C), Min:100.4 F (38 C), Max:103.1 F (39.5 C)  Recent Labs  Lab 07/20/20 1822 07/20/20 2022  WBC 3.5*  --   CREATININE 1.87*  --   LATICACIDVEN 4.2* 4.1*    Estimated Creatinine Clearance: 62.2 mL/min (A) (by C-G formula based on SCr of 1.87 mg/dL (H)).    No Known Allergies   Thank you for allowing pharmacy to be a part of this patient's care.  09/19/20 RPh 07/21/2020, 12:23 AM

## 2020-07-21 NOTE — Progress Notes (Signed)
RR RN contacted to assist  Due to patient change in status. On-call provider contacted, orders received to transfer patient to stepdown.

## 2020-07-21 NOTE — Progress Notes (Addendum)
   07/21/20 1640  Assess: MEWS Score  Temp 99.1 F (37.3 C)  BP (!) 146/86  Pulse Rate (!) 159  Resp (!) 30  SpO2 100 %  O2 Device Nasal Cannula  O2 Flow Rate (L/min) 2 L/min  Assess: MEWS Score  MEWS Temp 0  MEWS Systolic 0  MEWS Pulse 3  MEWS RR 2  MEWS LOC 0  MEWS Score 5  MEWS Score Color Red  Assess: if the MEWS score is Yellow or Red  Were vital signs taken at a resting state? Yes  Focused Assessment Change from prior assessment (see assessment flowsheet)  Early Detection of Sepsis Score *See Row Information* High  MEWS guidelines implemented *See Row Information* Yes    Patient returned from IR, A&Ox4,drain in place and intact per rectum. Foley catheter intact, patent,Urine pink/red, CBI clamped. Patient is experiencing tachypnea, diaphoretic tremors. Awaiting return call from provider, paged by IR RN

## 2020-07-22 ENCOUNTER — Encounter (HOSPITAL_COMMUNITY): Payer: Self-pay | Admitting: Urology

## 2020-07-22 LAB — BASIC METABOLIC PANEL
Anion gap: 9 (ref 5–15)
BUN: 17 mg/dL (ref 6–20)
CO2: 27 mmol/L (ref 22–32)
Calcium: 7.9 mg/dL — ABNORMAL LOW (ref 8.9–10.3)
Chloride: 99 mmol/L (ref 98–111)
Creatinine, Ser: 1.06 mg/dL (ref 0.61–1.24)
GFR calc non Af Amer: >60 mL/min (ref >60)
Glucose, Bld: 104 mg/dL — ABNORMAL HIGH (ref 70–99)
Potassium: 3.8 mmol/L (ref 3.5–5.1)
Sodium: 135 mmol/L (ref 135–145)

## 2020-07-22 LAB — CBC
HCT: 35 % — ABNORMAL LOW (ref 39.0–52.0)
Hemoglobin: 11.8 g/dL — ABNORMAL LOW (ref 13.0–17.0)
MCH: 28 pg (ref 26.0–34.0)
MCHC: 33.7 g/dL (ref 30.0–36.0)
MCV: 83.1 fL (ref 80.0–100.0)
Platelets: 113 10*3/uL — ABNORMAL LOW (ref 150–400)
RBC: 4.21 MIL/uL — ABNORMAL LOW (ref 4.22–5.81)
RDW: 14.7 % (ref 11.5–15.5)
WBC: 23.2 10*3/uL — ABNORMAL HIGH (ref 4.0–10.5)
nRBC: 0 % (ref 0.0–0.2)

## 2020-07-22 LAB — BLOOD CULTURE ID PANEL (REFLEXED) - BCID2

## 2020-07-22 LAB — LACTIC ACID, PLASMA: Lactic Acid, Venous: 1.4 mmol/L (ref 0.5–1.9)

## 2020-07-22 LAB — SURGICAL PATHOLOGY

## 2020-07-22 MED ORDER — SODIUM CHLORIDE 0.9% FLUSH
5.0000 mL | Freq: Three times a day (TID) | INTRAVENOUS | Status: DC
  Administered 2020-07-22: 5 mL

## 2020-07-22 MED ORDER — SODIUM CHLORIDE 0.9% FLUSH
5.0000 mL | Freq: Two times a day (BID) | INTRAVENOUS | Status: DC
  Administered 2020-07-22 – 2020-07-23 (×3): 5 mL

## 2020-07-22 MED ORDER — DOCUSATE SODIUM 50 MG PO CAPS
50.0000 mg | ORAL_CAPSULE | Freq: Two times a day (BID) | ORAL | Status: DC
  Administered 2020-07-22 – 2020-07-23 (×4): 50 mg via ORAL
  Filled 2020-07-22 (×5): qty 1

## 2020-07-22 NOTE — Progress Notes (Addendum)
S/p unsuccessful transurethral unroofing of prostatic abscess early on 10/5 and IR transrectal drain placement later on 10/5  Subjective: IR successfully placed transrectal drain into abscess ~5pm last night and aspirated ~30cc purulent fluid, which was sent for culture. Drain placed. Patient subsequently had a significant SIRS response, spiking fevers up to 105 with tachycardia to 170s and tachypnea requiring oxygen support. He was transferred to the ICU. He has subsequently dramatically improved.  Now afebrile, HR normalized, tachypnea improved, weaned to RA. Overall feels well but still has some pelvic pain. Great clear yellow urine via foley; CBI off and 3rd port plugged. 10cc drain output since placement, SS in appearance this morning. Lactate downtrending. Leukocytosis stable/slight improved. Cr normalized.  Continued on levaquin with drain cultures pending. Ucx x 2 NG. 1/2 Bcx with gram variable rods, final results pending.   Objective: Vital signs in last 24 hours: Temp:  [97.8 F (36.6 C)-105 F (40.6 C)] 97.8 F (36.6 C) (10/06 0800) Pulse Rate:  [72-172] 72 (10/06 0500) Resp:  [13-38] 16 (10/06 0500) BP: (101-155)/(46-93) 120/77 (10/06 0500) SpO2:  [91 %-100 %] 100 % (10/06 0500)  Intake/Output from previous day: 10/05 0701 - 10/06 0700 In: 4293.9 [P.O.:360; I.V.:1208.9; IV Piggyback:150] Out: 9260 [Urine:9250; Drains:10] Intake/Output this shift: No intake/output data recorded.  Physical Exam:  General: Alert and oriented, appears comfortable CV: Normal rate Lungs: NWOB on RA Abdomen: Soft, nontender, nondistended GU: Foley catheter draining clear yellow urine with 3rd port plugged. Transrectal drain with minimal SS fluid Ext: NT, No erythema  Lab Results: Recent Labs    07/20/20 1822 07/21/20 0508 07/22/20 0704  HGB 15.0 11.5* 11.8*  HCT 44.7 34.0* 35.0*   BMET Recent Labs    07/21/20 0508 07/22/20 0704  NA 136 135  K 4.1 3.8  CL 102 99  CO2 23 27   GLUCOSE 132* 104*  BUN 18 17  CREATININE 1.57* 1.06  CALCIUM 8.1* 7.9*     Studies/Results: CT ABDOMEN PELVIS W CONTRAST  Result Date: 07/20/2020 CLINICAL DATA:  Abdominal pain, perineal pain question of abscess EXAM: CT ABDOMEN AND PELVIS WITH CONTRAST TECHNIQUE: Multidetector CT imaging of the abdomen and pelvis was performed using the standard protocol following bolus administration of intravenous contrast. CONTRAST:  OMNIPAQUE IOHEXOL 300 MG/ML  SOLN COMPARISON:  None. FINDINGS: Lower chest: The visualized heart size within normal limits. No pericardial fluid/thickening. No hiatal hernia. The visualized portions of the lungs are clear. Hepatobiliary: The liver is normal in density without focal abnormality.The main portal vein is patent. No evidence of calcified gallstones, gallbladder wall thickening or biliary dilatation. Pancreas: Unremarkable. No pancreatic ductal dilatation or surrounding inflammatory changes. Spleen: Normal in size without focal abnormality. Adrenals/Urinary Tract: Both adrenal glands appear normal. The kidneys and collecting system appear normal without evidence of urinary tract calculus or hydronephrosis. Bladder is partially decompressed with apparent diffuse wall thickening. Stomach/Bowel: The stomach, small bowel, and colon are normal in appearance. No inflammatory changes, wall thickening, or obstructive findings.The appendix is normal. Vascular/Lymphatic: There are no enlarged mesenteric, retroperitoneal, or pelvic lymph nodes. No significant vascular findings are present. Reproductive: Within the base of the prostate gland extending into the inferior peroneal soft tissues and base of the penile bulb, right greater than left there is a multilocular fluid collection measuring approximately 2.9 x 4.1 by 4.8 cm. Other: No hernia or abdominal wall tissue mass. No subcutaneous emphysema seen within the soft tissues. Musculoskeletal: No acute or significant osseous  findings. IMPRESSION: 1. Multilocular fluid  collection, likely abscess,within the perineal soft tissues though to be extending from the prostate base and tracking into the bulb of the penile soft tissues, right greater than left ,measuring 2.9 x 4.1 x 4.8 cm. 2. -Bladder is partially decompressed with apparent wall thickening which could be due to acute cystitis versus physiologic under distension. Electronically Signed   By: Jonna Clark M.D.   On: 07/20/2020 20:42   DG Chest Port 1 View  Result Date: 07/21/2020 CLINICAL DATA:  Acute respiratory failure. EXAM: PORTABLE CHEST 1 VIEW COMPARISON:  Radiograph yesterday FINDINGS: The cardiomediastinal contours are normal. The lungs are clear. Pulmonary vasculature is normal. No consolidation, pleural effusion, or pneumothorax. No acute osseous abnormalities are seen. IMPRESSION: Negative AP view of the chest. Electronically Signed   By: Narda Rutherford M.D.   On: 07/21/2020 18:58   DG Chest Port 1 View  Result Date: 07/20/2020 CLINICAL DATA:  Suspected Sepsis Tachycardia, fatigue and loss of consciousness. Nausea and vomiting. EXAM: PORTABLE CHEST 1 VIEW COMPARISON:  None. FINDINGS: The cardiomediastinal contours are normal. The lungs are clear. Pulmonary vasculature is normal. No consolidation, pleural effusion, or pneumothorax. No acute osseous abnormalities are seen. IMPRESSION: Negative portable AP view of the chest. Electronically Signed   By: Narda Rutherford M.D.   On: 07/20/2020 18:57    Assessment/Plan: Tyler [name] is a 34 y.o. male with history of E Coli UTI who presented to the ED on 10/4 with pelvic pain, fevers, and imaging demonstrating a 2.9 x 4.1 x 4.8 cm prostate abscess, now s/p unsuccessful transurethral unroofing of prostate abscess early on 10/5 and successful IR transrectal drain placement later on 10/5. He subsequently had a significant SIRS response after IR drain placement requiring ICU transfer, but he has dramatically improved  overnight and is close to his baseline.    - Continue levaquin. Follow up blood and drain cultures. Narrow/adjust antibiotics accordingly - Continue foley catheter for 7 days for urethral healing - Continue transrectal drain and trend output - Daily labs - If patient continues to do well, then ok for transfer back to floor from our standpoint later today    LOS: 1 day   Margette Fast 07/22/2020, 8:20 AM

## 2020-07-22 NOTE — Progress Notes (Signed)
NAME:  Tyler [name], MRN:  [id number], DOB:  [birth year], LOS: 1 ADMISSION DATE:  07/20/2020, CONSULTATION DATE:  07/21/20 REFERRING MD:  Dr Berneice Heinrich, Urology, CHIEF COMPLAINT:  Hypoxemia, severe sepsis   Brief History   34 year old man admitted with recent E. coli UTI, evaluated in the ED 10/4 for pelvic pain, fever.  Found to have a 2.9 x 4.1 x 4.8 prostatic abscess on CT.  This was not reachable by transurethral resection, underwent IR ends urethral ultrasound drain into the prostatic abscess on 10/5.  Noted to have high fever, rigors post procedure. O2 needs increased from 2L/min to 6L/min. Was treated with cefepime on presentation, changed to levofloxacin and moved to the ICU for closer monitoring post-procedure 10/5.   Past Medical History  History reviewed. No pertinent past medical history.   Significant Hospital Events   To ICU with severe sepsis 10/5  Consults:  Urology IR  Procedures:  Transurethral prostate resection 10/4 IR transurethral prostatic abscess drain placement 10/5 >>   Significant Diagnostic Tests:  Chest x-ray 10/4 >> clear CT abdomen and pelvis with contrast 10/4 >> multilocular fluid collection in the perineal soft tissues through to the prostate base tracking into the bulb 2.9 x 4.1 x 4.8 cm.  Bladder wall thickening  Micro Data:  Prior urine 05/01/2019 >> E. Coli (R amp, amp/sulbactam, bactrim) Blood culture 10/4 >> 1 of 4 gram variable rod >>  Urine culture 10/4 >>  Abscess culture 10/5 >> abundant GPC, GNR, GPR >   Antimicrobials:  Cefepime 10/4 x1 Levofloxacin 10/5 >>   Interim history/subjective:   Feels better, weaned to room air Hemodynamically stable Tolerated p.o. diet last night, hungry this morning  Objective   Blood pressure 120/77, pulse 72, temperature 98.4 F (36.9 C), temperature source Oral, resp. rate 16, height 6' (1.829 m), weight 85.9 kg, SpO2 100 %.        Intake/Output Summary (Last 24 hours) at 07/22/2020 0758 Last data  filed at 07/22/2020 0600 Gross per 24 hour  Intake 4293.87 ml  Output 7860 ml  Net -3566.13 ml   Filed Weights   07/20/20 1820 07/21/20 0326  Weight: 81.6 kg 85.9 kg    Examination: General: Well-developed man, no distress, diaphoresis is resolved HENT: Oropharynx clear, no lesions Lungs: Clear bilaterally Cardiovascular: Regular, distant, no murmur Abdomen: Slight tenderness diffusely, no rebound or guarding, drain in place with sanguinous fluid in bulb Extremities: No edema Neuro: Awake, alert, appropriate, nonfocal GU: Blood-tinged urine in bag  Resolved Hospital Problem list     Assessment & Plan:  Severe sepsis due to prostatic abscess.  Status post transurethral drain in IR 10/5.  Improved.  Mild bump in lactate 10/5, clearing 1 of 4 blood cultures positive, question clinical significance -Levofloxacin for now, would tailor to abscess and blood culture data and sensitivities -If any decompensation then would add vancomycin until culture data available -Urology and IR management of his Foley, prostatic drain  Acute hypoxemic respiratory failure with increasing oxygen requirement.  At risk for acute lung injury given his sepsis.  Oxygen need quickly resolved, now on room air -Pulmonary hygiene -No intervention at this time.  Recheck a chest x-ray if he develops dyspnea, and evolving oxygen need  Acute renal failure.  Improving on 10/6 -Follow urine output, BMP with management of his sepsis   Best practice:  Diet: start diet if ok w urology Pain/Anxiety/Delirium protocol (if indicated):oxycodone  VAP protocol (if indicated): NA DVT prophylaxis: SCD GI prophylaxis: PPI Glucose control:  NA Mobility: BR Code Status: Full Family Communication: primary service Disposition: SDU, should be able to move to regular floor on 10/6 given his improvement  PCCM will sign off.  Please call if we can help in any way.  Labs   CBC: Recent Labs  Lab 07/20/20 1822  07/21/20 0508 07/22/20 0704  WBC 3.5* 24.2* 23.2*  NEUTROABS 2.8  --   --   HGB 15.0 11.5* 11.8*  HCT 44.7 34.0* 35.0*  MCV 82.8 82.7 83.1  PLT 209 141* 113*    Basic Metabolic Panel: Recent Labs  Lab 07/20/20 1822 07/21/20 0508 07/22/20 0704  NA 136 136 135  K 4.2 4.1 3.8  CL 97* 102 99  CO2 24 23 27   GLUCOSE 131* 132* 104*  BUN 13 18 17   CREATININE 1.87* 1.57* 1.06  CALCIUM 10.0 8.1* 7.9*   GFR: Estimated Creatinine Clearance: 109.8 mL/min (by C-G formula based on SCr of 1.06 mg/dL). Recent Labs  Lab 07/20/20 1822 07/20/20 1822 07/20/20 2022 07/21/20 0508 07/21/20 1811 07/21/20 1951 07/22/20 0704  WBC 3.5*  --   --  24.2*  --   --  23.2*  LATICACIDVEN 4.2*   < > 4.1* 1.9 3.5* 2.7*  --    < > = values in this interval not displayed.    Liver Function Tests: Recent Labs  Lab 07/20/20 1822  AST 49*  ALT 25  ALKPHOS 105  BILITOT 0.9  PROT 9.2*  ALBUMIN 3.9   No results for input(s): LIPASE, AMYLASE in the last 168 hours. No results for input(s): AMMONIA in the last 168 hours.  ABG No results found for: PHART, PCO2ART, PO2ART, HCO3, TCO2, ACIDBASEDEF, O2SAT   Coagulation Profile: Recent Labs  Lab 07/20/20 1822  INR 1.0    Cardiac Enzymes: No results for input(s): CKTOTAL, CKMB, CKMBINDEX, TROPONINI in the last 168 hours.  HbA1C: No results found for: HGBA1C  CBG: No results for input(s): GLUCAP in the last 168 hours.   Critical care time: N/A     09/19/20, MD, PhD 07/22/2020, 7:58 AM [facility] [phone] or if no answer [phone]

## 2020-07-22 NOTE — Progress Notes (Signed)
PHARMACY - PHYSICIAN COMMUNICATION CRITICAL VALUE ALERT - BLOOD CULTURE IDENTIFICATION (BCID)  Tyler [name] is an 34 y.o. male who presented to [facility] on 07/20/2020 with a chief complaint of pelvic pain and fever  Assessment: severe sepsis due to prostatic abscess  Name of physician (or Provider) Contacted: m Katherina Right  Current antibiotics: levaquin  Changes to prescribed antibiotics recommended:  none  Results for orders placed or performed during the hospital encounter of 07/20/20  Blood Culture ID Panel (Reflexed) (Collected: 07/20/2020  6:22 PM)  Result Value Ref Range   Enterococcus faecalis NOT DETECTED NOT DETECTED   Enterococcus Faecium NOT DETECTED NOT DETECTED   Listeria monocytogenes NOT DETECTED NOT DETECTED   Staphylococcus species NOT DETECTED NOT DETECTED   Staphylococcus aureus (BCID) NOT DETECTED NOT DETECTED   Staphylococcus epidermidis NOT DETECTED NOT DETECTED   Staphylococcus lugdunensis NOT DETECTED NOT DETECTED   Streptococcus species NOT DETECTED NOT DETECTED   Streptococcus agalactiae NOT DETECTED NOT DETECTED   Streptococcus pneumoniae NOT DETECTED NOT DETECTED   Streptococcus pyogenes NOT DETECTED NOT DETECTED   A.calcoaceticus-baumannii NOT DETECTED NOT DETECTED   Bacteroides fragilis NOT DETECTED NOT DETECTED   Enterobacterales NOT DETECTED NOT DETECTED   Enterobacter cloacae complex NOT DETECTED NOT DETECTED   Escherichia coli NOT DETECTED NOT DETECTED   Klebsiella aerogenes NOT DETECTED NOT DETECTED   Klebsiella oxytoca NOT DETECTED NOT DETECTED   Klebsiella pneumoniae NOT DETECTED NOT DETECTED   Proteus species NOT DETECTED NOT DETECTED   Salmonella species NOT DETECTED NOT DETECTED   Serratia marcescens NOT DETECTED NOT DETECTED   Haemophilus influenzae NOT DETECTED NOT DETECTED   Neisseria meningitidis NOT DETECTED NOT DETECTED   Pseudomonas aeruginosa NOT DETECTED NOT DETECTED   Stenotrophomonas maltophilia NOT DETECTED NOT DETECTED    Candida albicans NOT DETECTED NOT DETECTED   Candida auris NOT DETECTED NOT DETECTED   Candida glabrata NOT DETECTED NOT DETECTED   Candida krusei NOT DETECTED NOT DETECTED   Candida parapsilosis NOT DETECTED NOT DETECTED   Candida tropicalis NOT DETECTED NOT DETECTED   Cryptococcus neoformans/gattii NOT DETECTED NOT DETECTED    Arley Phenix RPh 07/22/2020, 2:15 AM

## 2020-07-22 NOTE — TOC Progression Note (Signed)
Transition of Care [facility]) - Progression Note    Patient Details  Name: Tyler [name] MRN: [id number] Date of Birth: 1986-08-18  [facility] CM/SW Contact  Golda Acre, RN Phone Number: 07/22/2020, 9:00 AM  Clinical Narrative:    Temp this am 105,iv levaquin,deep surgical wound culture growing,GPC/pt has prostatic abcess/wbc-23.2 Following for progression  To go home    Expected Discharge Plan: Home/Self Care Barriers to Discharge: Continued Medical Work up  Expected Discharge Plan and Services Expected Discharge Plan: Home/Self Care   Discharge Planning Services: CM Consult Post Acute Care Choice: NA Living arrangements for the past 2 months: Single Family Home                                       Social Determinants of Health (SDOH) Interventions    Readmission Risk Interventions No flowsheet data found.

## 2020-07-22 NOTE — Progress Notes (Signed)
Referring Physician(s): Dr. Lindajo RoyalMcCloskey  Supervising Physician: Oley BalmHassell, Daniel  [facility]  [facility]  Chief Complaint: Prostate abscess. S/p transrectal abscess drain 07/21/20 by Dr. Archer AsaMcCullough  Subjective: Patient awake/alert in bed, NSR on the monitor. He was talking on the phone when I arrived. No apparent discomfort/distress. He states he has moderate pain related to the foley catheter and the transrectal drain. He is taking tylenol. Urine is mostly clear with scant amount of blood. JP bulb has approximately 20 cc of dark red fluid.   HPI: Patient with a history of  E. Coli UTI. He presented to the ED with pelvic pain and fevers and found to have a prostate abscess. CT abdomen pelvis from 07/21/20 reads: multilocular fluid collection, likely abscess, within the perineal soft tissues though to be extending from the prostate base and tracking into the bulb of the penile soft tissues, right greater than left, measuring 2.9 x 4.1 x 4.8 cm. On 07/21/20 Urology attempted a transurethral unroofing however they were unable to visualize the abscess and the patient was sent to IR. Post-procedure the patient developed a significant SIRS response with fevers up to 105, tachycardia in the 170's, tachypnea requiring O2 support and rigors. He was transferred to the ICU.   Allergies: Patient has no known allergies.  Medications: Prior to Admission medications   Medication Sig Start Date End Date Taking? Authorizing Provider  cyclobenzaprine (FLEXERIL) 10 MG tablet Take 1 tablet (10 mg total) by mouth 2 (two) times daily as needed for muscle spasms. Patient not taking: Reported on 07/20/2020 11/15/12   Fayrene Helperran, [place], PA-C  HYDROcodone-acetaminophen (NORCO/VICODIN) 5-325 MG per tablet Take 2 tablets by mouth every 4 (four) hours as needed for pain. Patient not taking: Reported on 07/20/2020 11/15/12   Fayrene Helperran, [place], PA-C  ibuprofen (ADVIL) 600 MG tablet Take 1 tablet (600 mg total) by mouth every 6 (six)  hours as needed. Patient not taking: Reported on 07/20/2020 05/01/19   Fayrene Helperran, [place], PA-C  traZODone (DESYREL) 50 MG tablet Take 50 mg by mouth at bedtime. 07/05/20   [provider]     Vital Signs: BP 120/77   Pulse 72   Temp 98.2 F (36.8 C) (Oral)   Resp 16   Ht 6' (1.829 m)   Wt 189 lb 6.4 oz (85.9 kg)   SpO2 100%   BMI 25.69 kg/m   Physical Exam Constitutional:      General: He is not in acute distress.    Appearance: Normal appearance.  Cardiovascular:     Rate and Rhythm: Normal rate and regular rhythm.  Pulmonary:     Effort: Pulmonary effort is normal.  Abdominal:     Palpations: Abdomen is soft.  Genitourinary:    Comments: Foley catheter with mostly clear yellow urine. Scant blood identified. Transrectal drain to suction. JP bulb with approximately 20 cc of dark red fluid. Per RN this drain was flushed without difficulty at 0600.  Skin:    General: Skin is warm and dry.  Neurological:     Mental Status: He is alert and oriented to person, place, and time.     Imaging: CT ABDOMEN PELVIS W CONTRAST  Result Date: 07/20/2020 CLINICAL DATA:  Abdominal pain, perineal pain question of abscess EXAM: CT ABDOMEN AND PELVIS WITH CONTRAST TECHNIQUE: Multidetector CT imaging of the abdomen and pelvis was performed using the standard protocol following bolus administration of intravenous contrast. CONTRAST:  100mL OMNIPAQUE IOHEXOL 300 MG/ML  SOLN COMPARISON:  None. FINDINGS:  Lower chest: The visualized heart size within normal limits. No pericardial fluid/thickening. No hiatal hernia. The visualized portions of the lungs are clear. Hepatobiliary: The liver is normal in density without focal abnormality.The main portal vein is patent. No evidence of calcified gallstones, gallbladder wall thickening or biliary dilatation. Pancreas: Unremarkable. No pancreatic ductal dilatation or surrounding inflammatory changes. Spleen: Normal in size without focal abnormality.  Adrenals/Urinary Tract: Both adrenal glands appear normal. The kidneys and collecting system appear normal without evidence of urinary tract calculus or hydronephrosis. Bladder is partially decompressed with apparent diffuse wall thickening. Stomach/Bowel: The stomach, small bowel, and colon are normal in appearance. No inflammatory changes, wall thickening, or obstructive findings.The appendix is normal. Vascular/Lymphatic: There are no enlarged mesenteric, retroperitoneal, or pelvic lymph nodes. No significant vascular findings are present. Reproductive: Within the base of the prostate gland extending into the inferior peroneal soft tissues and base of the penile bulb, right greater than left there is a multilocular fluid collection measuring approximately 2.9 x 4.1 by 4.8 cm. Other: No hernia or abdominal wall tissue mass. No subcutaneous emphysema seen within the soft tissues. Musculoskeletal: No acute or significant osseous findings. IMPRESSION: 1. Multilocular fluid collection, likely abscess,within the perineal soft tissues though to be extending from the prostate base and tracking into the bulb of the penile soft tissues, right greater than left ,measuring 2.9 x 4.1 x 4.8 cm. 2. -Bladder is partially decompressed with apparent wall thickening which could be due to acute cystitis versus physiologic under distension. Electronically Signed   By: Jonna Clark M.D.   On: 07/20/2020 20:42   DG Chest Port 1 View  Result Date: 07/21/2020 CLINICAL DATA:  Acute respiratory failure. EXAM: PORTABLE CHEST 1 VIEW COMPARISON:  Radiograph yesterday FINDINGS: The cardiomediastinal contours are normal. The lungs are clear. Pulmonary vasculature is normal. No consolidation, pleural effusion, or pneumothorax. No acute osseous abnormalities are seen. IMPRESSION: Negative AP view of the chest. Electronically Signed   By: Narda Rutherford M.D.   On: 07/21/2020 18:58   DG Chest Port 1 View  Result Date: 07/20/2020 CLINICAL  DATA:  Suspected Sepsis Tachycardia, fatigue and loss of consciousness. Nausea and vomiting. EXAM: PORTABLE CHEST 1 VIEW COMPARISON:  None. FINDINGS: The cardiomediastinal contours are normal. The lungs are clear. Pulmonary vasculature is normal. No consolidation, pleural effusion, or pneumothorax. No acute osseous abnormalities are seen. IMPRESSION: Negative portable AP view of the chest. Electronically Signed   By: Narda Rutherford M.D.   On: 07/20/2020 18:57   [place] FNA SOFT TISSUE  Result Date: 07/22/2020 INDICATION: 34 year old male with prostatic abscess and sepsis EXAM: Ultrasound-guided transrectal drain placement MEDICATIONS: The patient is currently admitted to the hospital and receiving intravenous antibiotics. The antibiotics were administered within an appropriate time frame prior to the initiation of the procedure. ANESTHESIA/SEDATION: Fentanyl 50 mcg IV; Versed 5 mg IV; 50 mg Benadryl Moderate Sedation Time:  10 minutes The patient was continuously monitored during the procedure by the interventional radiology nurse under my direct supervision. COMPLICATIONS: SIR LEVEL B - Normal therapy, includes overnight admission for observation. With an approximately 30 minutes of the procedure, after the patient had returned to the floor, he developed significant fever to 105 degrees, rigors and hypotension consistent with acute bacteremia. This was treated with intravenous Demerol, IV fluids and care was transition to the step-down unit. PROCEDURE: Informed written consent was obtained from the patient after a thorough discussion of the procedural risks, benefits and alternatives. All questions were addressed. Maximal Sterile Barrier  Technique was utilized including caps, mask, sterile gowns, sterile gloves, sterile drape, hand hygiene and skin antiseptic. A timeout was performed prior to the initiation of the procedure. The patient was placed in the [place]' position. After adequate sedation was achieved,  initial ultrasound imaging was performed trans rectally utilizing a transvaginal probe. Anterior to the rectum, there is an ill-defined highly complex fluid collection within and partially encircling the posterior aspect of the prostate gland. Fluid tracks into the base of the penis as seen on prior CT imaging. Visualization of the abscess collection was quite good via transrectal approach. Next, trans peritoneal imaging was also performed using the 9 linear probe. Transperinealy, there appears to be a longer and more challenging course into the complex fluid collection that would necessitate more crossing of prostatic parenchyma. Therefore, the decision was made to proceed with transrectal drainage catheter approach. A peel-away guide was fashioned in my usual fashion (utilizing the outer plastic covering of the 8 French drainage catheter, cutting out to length and creating a longitudinal incision along the length of the tubing. This was then attached to the transvaginal probe using several sterile rubber bands. Transvaginal probe was again lubricated in inserted into the rectum. The abscess was positioned directly in front of the trans rectal probe. Local anesthesia was then achieved by injection of 1% lidocaine utilizing a 15 cm 22 gauge Chiba needle and real-time trans rectal ultrasound imaging. After the rectal wall was sufficiently anesthetized, and 8.5 [place] cook all-purpose drainage catheter was used in trocar fashion and directed through the peel-away guide, through the anterior rectal wall and into the abscess cavity. This was visualized continuously by ultrasound. Once the tip of the drainage catheter had penetrated the complex fluid collection, the catheter was released from the metal stylet and advanced into the fluid collection. The locking loop was then formed. Aspiration yields approximately 30 mL of bloody, foul-smelling purulent fluid. The catheter was secured to the buttock utilizing an adhesive  fixation device. Overall, the patient tolerated the procedure well. IMPRESSION: Successful placement of an 8.5 French drainage catheter via transrectal approach into the prosthetic abscess yielding approximately 30 mL of foul-smelling bloody purulent fluid. PLAN: 1. Aspirated fluid sent for Gram stain and culture. 2. Maintain drain to JP bulb suction. 3. Flush gently with 5 mL sterile saline at least once per shift. 4. When drain output is scant and clear, the drainage catheter can be removed. Signed, Sterling Big, MD, RPVI [facility] Electronically Signed   By: Malachy Moan M.D.   On: 07/22/2020 09:03    Labs:  CBC: Recent Labs    07/20/20 1822 07/21/20 0508 07/22/20 0704  WBC 3.5* 24.2* 23.2*  HGB 15.0 11.5* 11.8*  HCT 44.7 34.0* 35.0*  PLT 209 141* 113*    COAGS: Recent Labs    07/20/20 1822 07/20/20 1844  INR 1.0  --   APTT  --  25    BMP: Recent Labs    07/20/20 1822 07/21/20 0508 07/22/20 0704  NA 136 136 135  K 4.2 4.1 3.8  CL 97* 102 99  CO2 24 23 27   GLUCOSE 131* 132* 104*  BUN 13 18 17   CALCIUM 10.0 8.1* 7.9*  CREATININE 1.87* 1.57* 1.06  GFRNONAA 47* 57* >60  GFRAA 54* >60  --     LIVER FUNCTION TESTS: Recent Labs    07/20/20 1822  BILITOT 0.9  AST 49*  ALT 25  ALKPHOS 105  PROT 9.2*  ALBUMIN 3.9    Assessment and Plan:  Prostate abscess, s/p transrectal abscess drain placement 07/21/20: WBC count trending down. Lactic acid is now 1.4. Patient is afebrile and may transfer out of the ICU today or tomorrow.  Continue flushing drain and documenting output each shift. Colace ordered to help maintain catheter position during bowel movements.   Other plans per primary teams. IR will continue to follow.   Electronically Signed: Alwyn Ren, AGACNP-BC [phone] 07/22/2020, 1:54 PM   I spent a total of 15 Minutes at the the patient's bedside AND on the patient's hospital  floor or unit, greater than 50% of which was counseling/coordinating care for transrectal prostate abscess drain.

## 2020-07-23 ENCOUNTER — Encounter (HOSPITAL_COMMUNITY): Payer: Self-pay | Admitting: Urology

## 2020-07-23 ENCOUNTER — Inpatient Hospital Stay (HOSPITAL_COMMUNITY): Payer: Self-pay

## 2020-07-23 LAB — CBC
HCT: 37.4 % — ABNORMAL LOW (ref 39.0–52.0)
Hemoglobin: 12.5 g/dL — ABNORMAL LOW (ref 13.0–17.0)
MCH: 27.7 pg (ref 26.0–34.0)
MCHC: 33.4 g/dL (ref 30.0–36.0)
MCV: 82.7 fL (ref 80.0–100.0)
Platelets: 106 10*3/uL — ABNORMAL LOW (ref 150–400)
RBC: 4.52 MIL/uL (ref 4.22–5.81)
RDW: 14.5 % (ref 11.5–15.5)
WBC: 14.6 10*3/uL — ABNORMAL HIGH (ref 4.0–10.5)
nRBC: 0 % (ref 0.0–0.2)

## 2020-07-23 LAB — BASIC METABOLIC PANEL
Anion gap: 10 (ref 5–15)
BUN: 15 mg/dL (ref 6–20)
CO2: 27 mmol/L (ref 22–32)
Calcium: 8.7 mg/dL — ABNORMAL LOW (ref 8.9–10.3)
Chloride: 101 mmol/L (ref 98–111)
Creatinine, Ser: 1.03 mg/dL (ref 0.61–1.24)
GFR calc non Af Amer: >60 mL/min (ref >60)
Glucose, Bld: 92 mg/dL (ref 70–99)
Potassium: 3.8 mmol/L (ref 3.5–5.1)
Sodium: 138 mmol/L (ref 135–145)

## 2020-07-23 MED ORDER — IOHEXOL 300 MG/ML  SOLN
100.0000 mL | Freq: Once | INTRAMUSCULAR | Status: AC | PRN
  Administered 2020-07-23: 100 mL via INTRAVENOUS

## 2020-07-23 NOTE — Progress Notes (Addendum)
S/p unsuccessful transurethral unroofing of prostatic abscess early on 10/5 and IR transrectal drain placement later on 10/5  Subjective: Continues to do well. NAEON. AFHDS. Made floor status. Patient reports minimal improvement in pelvic pain this morning; also reports neck pain. Hasn't been ambulating much. 10cc output from JP drain yesterday. Good UOP via foley, pink-tinged without clots.   Leukocytosis downtrending. CT pelvis with drain is good position and minimal surrounding fluid; final read pending. Drain and blood cultures pending.   Objective: Vital signs in last 24 hours: Temp:  [97.8 F (36.6 C)-98.9 F (37.2 C)] 98.2 F (36.8 C) (10/07 0400) Pulse Rate:  [61-103] 81 (10/07 0600) Resp:  [13-25] 14 (10/07 0600) BP: (106-155)/(48-107) 124/66 (10/07 0600) SpO2:  [94 %-100 %] 97 % (10/07 0600)  Intake/Output from previous day: 10/06 0701 - 10/07 0700 In: 1378.8 [P.O.:1320; IV Piggyback:58.8] Out: 2660 [Urine:2650; Drains:10] Intake/Output this shift: No intake/output data recorded.  Physical Exam:  General: Alert and oriented, appears comfortable CV: Normal rate Lungs: NWOB on RA Abdomen: Soft, nontender, nondistended GU: Foley catheter draining pink-tinged urine with 3rd port plugged. Transrectal drain with scant SS fluid Ext: NT, No erythema  Lab Results: Recent Labs    07/21/20 0508 07/22/20 0704 07/23/20 0603  HGB 11.5* 11.8* 12.5*  HCT 34.0* 35.0* 37.4*   BMET Recent Labs    07/22/20 0704 07/23/20 0603  NA 135 138  K 3.8 3.8  CL 99 101  CO2 27 27  GLUCOSE 104* 92  BUN 17 15  CREATININE 1.06 1.03  CALCIUM 7.9* 8.7*     Studies/Results: DG Chest Port 1 View  Result Date: 07/21/2020 CLINICAL DATA:  Acute respiratory failure. EXAM: PORTABLE CHEST 1 VIEW COMPARISON:  Radiograph yesterday FINDINGS: The cardiomediastinal contours are normal. The lungs are clear. Pulmonary vasculature is normal. No consolidation, pleural effusion, or pneumothorax. No  acute osseous abnormalities are seen. IMPRESSION: Negative AP view of the chest. Electronically Signed   By: Narda Rutherford M.D.   On: 07/21/2020 18:58   [place] FNA SOFT TISSUE  Result Date: 07/22/2020 INDICATION: 34 year old male with prostatic abscess and sepsis EXAM: Ultrasound-guided transrectal drain placement MEDICATIONS: The patient is currently admitted to the hospital and receiving intravenous antibiotics. The antibiotics were administered within an appropriate time frame prior to the initiation of the procedure. ANESTHESIA/SEDATION: Fentanyl 50 mcg IV; Versed 5 mg IV; 50 mg Benadryl Moderate Sedation Time:  10 minutes The patient was continuously monitored during the procedure by the interventional radiology nurse under my direct supervision. COMPLICATIONS: SIR LEVEL B - Normal therapy, includes overnight admission for observation. With an approximately 30 minutes of the procedure, after the patient had returned to the floor, he developed significant fever to 105 degrees, rigors and hypotension consistent with acute bacteremia. This was treated with intravenous Demerol, IV fluids and care was transition to the step-down unit. PROCEDURE: Informed written consent was obtained from the patient after a thorough discussion of the procedural risks, benefits and alternatives. All questions were addressed. Maximal Sterile Barrier Technique was utilized including caps, mask, sterile gowns, sterile gloves, sterile drape, hand hygiene and skin antiseptic. A timeout was performed prior to the initiation of the procedure. The patient was placed in the [place]' position. After adequate sedation was achieved, initial ultrasound imaging was performed trans rectally utilizing a transvaginal probe. Anterior to the rectum, there is an ill-defined highly complex fluid collection within and partially encircling the posterior aspect of the prostate gland. Fluid tracks into the base of the penis  as seen on prior CT imaging.  Visualization of the abscess collection was quite good via transrectal approach. Next, trans peritoneal imaging was also performed using the 9 linear probe. Transperinealy, there appears to be a longer and more challenging course into the complex fluid collection that would necessitate more crossing of prostatic parenchyma. Therefore, the decision was made to proceed with transrectal drainage catheter approach. A peel-away guide was fashioned in my usual fashion (utilizing the outer plastic covering of the 8 French drainage catheter, cutting out to length and creating a longitudinal incision along the length of the tubing. This was then attached to the transvaginal probe using several sterile rubber bands. Transvaginal probe was again lubricated in inserted into the rectum. The abscess was positioned directly in front of the trans rectal probe. Local anesthesia was then achieved by injection of 1% lidocaine utilizing a 15 cm 22 gauge Chiba needle and real-time trans rectal ultrasound imaging. After the rectal wall was sufficiently anesthetized, and 8.5 [place] cook all-purpose drainage catheter was used in trocar fashion and directed through the peel-away guide, through the anterior rectal wall and into the abscess cavity. This was visualized continuously by ultrasound. Once the tip of the drainage catheter had penetrated the complex fluid collection, the catheter was released from the metal stylet and advanced into the fluid collection. The locking loop was then formed. Aspiration yields approximately 30 mL of bloody, foul-smelling purulent fluid. The catheter was secured to the buttock utilizing an adhesive fixation device. Overall, the patient tolerated the procedure well. IMPRESSION: Successful placement of an 8.5 French drainage catheter via transrectal approach into the prosthetic abscess yielding approximately 30 mL of foul-smelling bloody purulent fluid. PLAN: 1. Aspirated fluid sent for Gram stain and  culture. 2. Maintain drain to JP bulb suction. 3. Flush gently with 5 mL sterile saline at least once per shift. 4. When drain output is scant and clear, the drainage catheter can be removed. Signed, Sterling Big, MD, RPVI [facility]iology Electronically Signed   By: Malachy Moan M.D.   On: 07/22/2020 09:03    Assessment/Plan: Tyler [name] is a 34 y.o. male with history of E Coli UTI who presented to the ED on 10/4 with pelvic pain, fevers, and imaging demonstrating a 2.9 x 4.1 x 4.8 cm prostate abscess, now s/p unsuccessful transurethral unroofing of prostate abscess early on 10/5 and successful IR transrectal drain placement later on 10/5. He subsequently had a significant SIRS response after IR drain placement requiring ICU transfer, but he dramatically improved overnight and is now floor status.    - Continue levaquin. Follow up blood and drain cultures. Narrow/adjust antibiotics accordingly - Continue foley catheter for 7 days for urethral healing - Continue transrectal drain and trend output - Daily labs - Heating pad for neck - Follow up final read of CT pelvis  - Encourage ambulation. Discussed starting DVT ppx if patient unable to walk. Patient would prefer to try walking first   LOS: 2 days   Margette Fast 07/23/2020, 7:52 AM

## 2020-07-23 NOTE — Progress Notes (Signed)
Referring Physician(s): McCloskey,H  Supervising Physician: Malachy Moan  Patient Status:  [facility] - In-pt  Chief Complaint: Rectal pain/prostatic abscess   Subjective: Pt drowsy but arousable; does have some rectal pain at site of drain ; no nausea or vomiting   Allergies: Patient has no known allergies.  Medications: Prior to Admission medications   Medication Sig Start Date End Date Taking? Authorizing Provider  cyclobenzaprine (FLEXERIL) 10 MG tablet Take 1 tablet (10 mg total) by mouth 2 (two) times daily as needed for muscle spasms. Patient not taking: Reported on 07/20/2020 11/15/12   Fayrene Helper, PA-C  HYDROcodone-acetaminophen (NORCO/VICODIN) 5-325 MG per tablet Take 2 tablets by mouth every 4 (four) hours as needed for pain. Patient not taking: Reported on 07/20/2020 11/15/12   Fayrene Helper, PA-C  ibuprofen (ADVIL) 600 MG tablet Take 1 tablet (600 mg total) by mouth every 6 (six) hours as needed. Patient not taking: Reported on 07/20/2020 05/01/19   Fayrene Helper, PA-C  traZODone (DESYREL) 50 MG tablet Take 50 mg by mouth at bedtime. 07/05/20   [provider]     Vital Signs: BP 118/74   Pulse 82   Temp 98 F (36.7 C) (Oral)   Resp 20   Ht 6' (1.829 m)   Wt 189 lb 6.4 oz (85.9 kg)   SpO2 100%   BMI 25.69 kg/m   Physical Exam drowsy but arousable, transrectal drain intact, output 10 cc serosanguineous fluid, drain flushed without difficulty  Imaging: CT PELVIS W CONTRAST  Result Date: 07/23/2020 CLINICAL DATA:  34 year old male with prostate abscess and sepsis. Postprocedural day 2 status post ultrasound-guided transrectal drain placement. EXAM: CT PELVIS WITH CONTRAST TECHNIQUE: Multidetector CT imaging of the pelvis was performed using the standard protocol following the bolus administration of intravenous contrast. CONTRAST:  OMNIPAQUE IOHEXOL 300 MG/ML  SOLN COMPARISON:  Pre drainage CT Abdomen and Pelvis 07/20/2020. FINDINGS: Urinary Tract:  Kidneys are not included. No hydroureter is evident. Foley catheter decompresses the urinary bladder which is thick-walled. Bowel: Continued fluid-filled small bowel loops in the pelvis, stable. Largely decompressed rectosigmoid colon, visible descending colon. Mild stool and gas in the cecum. No bowel wall thickening identified. Vascular/Lymphatic: Visible iliac and proximal femoral arteries are patent. There is minimal right common iliac calcified plaque. Iliac Chain lymph nodes are within normal limits, up to 7 or 8 mm short axis. Inguinal lymph nodes are stable and within normal limits. Reproductive: Small pigtail drain terminates in the prostate and at the base of the penis via transrectal approach. Regional soft tissue edema is evident, but no discrete regional fluid collection can be identified. Urethral catheter in place. Other: Mildly increased generalized soft tissue edema in the lower pelvis. Trace pelvic free fluid felt less likely. Musculoskeletal: Mild generalized subcutaneous edema throughout the pelvis. Otherwise satisfactory appearance of the perineum. IMPRESSION: 1. No residual fluid or adverse features identified status post transrectal drain placement into prostate abscess. 2. Mild generalized soft tissue edema throughout the pelvis. 3. Diffuse bladder wall thickening.  Foley catheter in place. Electronically Signed   By: Odessa Fleming M.D.   On: 07/23/2020 08:09   CT ABDOMEN PELVIS W CONTRAST  Result Date: 07/20/2020 CLINICAL DATA:  Abdominal pain, perineal pain question of abscess EXAM: CT ABDOMEN AND PELVIS WITH CONTRAST TECHNIQUE: Multidetector CT imaging of the abdomen and pelvis was performed using the standard protocol following bolus administration of intravenous contrast. CONTRAST:  OMNIPAQUE IOHEXOL 300 MG/ML  SOLN COMPARISON:  None. FINDINGS: Lower  chest: The visualized heart size within normal limits. No pericardial fluid/thickening. No hiatal hernia. The visualized portions of  the lungs are clear. Hepatobiliary: The liver is normal in density without focal abnormality.The main portal vein is patent. No evidence of calcified gallstones, gallbladder wall thickening or biliary dilatation. Pancreas: Unremarkable. No pancreatic ductal dilatation or surrounding inflammatory changes. Spleen: Normal in size without focal abnormality. Adrenals/Urinary Tract: Both adrenal glands appear normal. The kidneys and collecting system appear normal without evidence of urinary tract calculus or hydronephrosis. Bladder is partially decompressed with apparent diffuse wall thickening. Stomach/Bowel: The stomach, small bowel, and colon are normal in appearance. No inflammatory changes, wall thickening, or obstructive findings.The appendix is normal. Vascular/Lymphatic: There are no enlarged mesenteric, retroperitoneal, or pelvic lymph nodes. No significant vascular findings are present. Reproductive: Within the base of the prostate gland extending into the inferior peroneal soft tissues and base of the penile bulb, right greater than left there is a multilocular fluid collection measuring approximately 2.9 x 4.1 by 4.8 cm. Other: No hernia or abdominal wall tissue mass. No subcutaneous emphysema seen within the soft tissues. Musculoskeletal: No acute or significant osseous findings. IMPRESSION: 1. Multilocular fluid collection, likely abscess,within the perineal soft tissues though to be extending from the prostate base and tracking into the bulb of the penile soft tissues, right greater than left ,measuring 2.9 x 4.1 x 4.8 cm. 2. -Bladder is partially decompressed with apparent wall thickening which could be due to acute cystitis versus physiologic under distension. Electronically Signed   By: Jonna ClarkBindu  Avutu M.D.   On: 07/20/2020 20:42   DG Chest Port 1 View  Result Date: 07/21/2020 CLINICAL DATA:  Acute respiratory failure. EXAM: PORTABLE CHEST 1 VIEW COMPARISON:  Radiograph yesterday FINDINGS: The  cardiomediastinal contours are normal. The lungs are clear. Pulmonary vasculature is normal. No consolidation, pleural effusion, or pneumothorax. No acute osseous abnormalities are seen. IMPRESSION: Negative AP view of the chest. Electronically Signed   By: Narda RutherfordMelanie  Sanford M.D.   On: 07/21/2020 18:58   DG Chest Port 1 View  Result Date: 07/20/2020 CLINICAL DATA:  Suspected Sepsis Tachycardia, fatigue and loss of consciousness. Nausea and vomiting. EXAM: PORTABLE CHEST 1 VIEW COMPARISON:  None. FINDINGS: The cardiomediastinal contours are normal. The lungs are clear. Pulmonary vasculature is normal. No consolidation, pleural effusion, or pneumothorax. No acute osseous abnormalities are seen. IMPRESSION: Negative portable AP view of the chest. Electronically Signed   By: Narda RutherfordMelanie  Sanford M.D.   On: 07/20/2020 18:57   US FNA SOFT TISSUE  Result Date: 07/22/2020 INDICATION: 34 year old male with prostatic abscess and sepsis EXAM: Ultrasound-guided transrectal drain placement MEDICATIONS: The patient is currently admitted to the hospital and receiving intravenous antibiotics. The antibiotics were administered within an appropriate time frame prior to the initiation of the procedure. ANESTHESIA/SEDATION: Fentanyl 50 mcg IV; Versed 5 mg IV; 50 mg Benadryl Moderate Sedation Time:  10 minutes The patient was continuously monitored during the procedure by the interventional radiology nurse under my direct supervision. COMPLICATIONS: SIR LEVEL B - Normal therapy, includes overnight admission for observation. With an approximately 30 minutes of the procedure, after the patient had returned to the floor, he developed significant fever to 105 degrees, rigors and hypotension consistent with acute bacteremia. This was treated with intravenous Demerol, IV fluids and care was transition to the step-down unit. PROCEDURE: Informed written consent was obtained from the patient after a thorough discussion of the procedural  risks, benefits and alternatives. All questions were addressed. Maximal Sterile Barrier  Technique was utilized including caps, mask, sterile gowns, sterile gloves, sterile drape, hand hygiene and skin antiseptic. A timeout was performed prior to the initiation of the procedure. The patient was placed in the [place]' position. After adequate sedation was achieved, initial ultrasound imaging was performed trans rectally utilizing a transvaginal probe. Anterior to the rectum, there is an ill-defined highly complex fluid collection within and partially encircling the posterior aspect of the prostate gland. Fluid tracks into the base of the penis as seen on prior CT imaging. Visualization of the abscess collection was quite good via transrectal approach. Next, trans peritoneal imaging was also performed using the 9 linear probe. Transperinealy, there appears to be a longer and more challenging course into the complex fluid collection that would necessitate more crossing of prostatic parenchyma. Therefore, the decision was made to proceed with transrectal drainage catheter approach. A peel-away guide was fashioned in my usual fashion (utilizing the outer plastic covering of the 8 French drainage catheter, cutting out to length and creating a longitudinal incision along the length of the tubing. This was then attached to the transvaginal probe using several sterile rubber bands. Transvaginal probe was again lubricated in inserted into the rectum. The abscess was positioned directly in front of the trans rectal probe. Local anesthesia was then achieved by injection of 1% lidocaine utilizing a 15 cm 22 gauge Chiba needle and real-time trans rectal ultrasound imaging. After the rectal wall was sufficiently anesthetized, and 8.5 [place] cook all-purpose drainage catheter was used in trocar fashion and directed through the peel-away guide, through the anterior rectal wall and into the abscess cavity. This was visualized  continuously by ultrasound. Once the tip of the drainage catheter had penetrated the complex fluid collection, the catheter was released from the metal stylet and advanced into the fluid collection. The locking loop was then formed. Aspiration yields approximately 30 mL of bloody, foul-smelling purulent fluid. The catheter was secured to the buttock utilizing an adhesive fixation device. Overall, the patient tolerated the procedure well. IMPRESSION: Successful placement of an 8.5 French drainage catheter via transrectal approach into the prosthetic abscess yielding approximately 30 mL of foul-smelling bloody purulent fluid. PLAN: 1. Aspirated fluid sent for Gram stain and culture. 2. Maintain drain to JP bulb suction. 3. Flush gently with 5 mL sterile saline at least once per shift. 4. When drain output is scant and clear, the drainage catheter can be removed. Signed, Sterling Big, MD, RPVI [facility] Electronically Signed   By: Malachy Moan M.D.   On: 07/22/2020 09:03    Labs:  CBC: Recent Labs    07/20/20 1822 07/21/20 0508 07/22/20 0704 07/23/20 0603  WBC 3.5* 24.2* 23.2* 14.6*  HGB 15.0 11.5* 11.8* 12.5*  HCT 44.7 34.0* 35.0* 37.4*  PLT 209 141* 113* 106*    COAGS: Recent Labs    07/20/20 1822 07/20/20 1844  INR 1.0  --   APTT  --  25    BMP: Recent Labs    07/20/20 1822 07/21/20 0508 07/22/20 0704 07/23/20 0603  NA 136 136 135 138  K 4.2 4.1 3.8 3.8  CL 97* 102 99 101  CO2 24 23 27 27   GLUCOSE 131* 132* 104* 92  BUN 13 18 17 15   CALCIUM 10.0 8.1* 7.9* 8.7*  CREATININE 1.87* 1.57* 1.06 1.03  GFRNONAA 47* 57* >60 >60  GFRAA 54* >60  --   --     LIVER FUNCTION TESTS: Recent Labs  07/20/20 1822  BILITOT 0.9  AST 49*  ALT 25  ALKPHOS 105  PROT 9.2*  ALBUMIN 3.9    Assessment and Plan: Patient with history of sepsis/prostatic abscess, status post drain placement on 10/5; WBC 14.6 down  from 23.2, hemoglobin 12.5 up from 11.8, platelets 106k, creatinine 1.03, abscess cultures negative to date/blood cx pend; CT pelvis today with no residual fluid or adverse features identified around prostate abscess, mild generalized soft tissue edema throughout the pelvis, diffuse bladder wall thickening; cont drain as per urology, monitor OP closely   Electronically Signed: D. Jeananne Rama, PA-C 07/23/2020, 12:25 PM   I spent a total of 15 minutes at the the patient's bedside AND on the patient's hospital floor or unit, greater than 50% of which was counseling/coordinating care for prostate abscess drain    Patient ID: Tyler [name], male   DOB: 05/14/1986, 34 y.o.   MRN: [id number]

## 2020-07-24 ENCOUNTER — Other Ambulatory Visit (HOSPITAL_COMMUNITY): Payer: Self-pay | Admitting: Urology

## 2020-07-24 ENCOUNTER — Encounter (HOSPITAL_COMMUNITY): Payer: Self-pay | Admitting: Urology

## 2020-07-24 LAB — BASIC METABOLIC PANEL
Anion gap: 9 (ref 5–15)
BUN: 12 mg/dL (ref 6–20)
CO2: 27 mmol/L (ref 22–32)
Calcium: 8.5 mg/dL — ABNORMAL LOW (ref 8.9–10.3)
Chloride: 99 mmol/L (ref 98–111)
Creatinine, Ser: 1.03 mg/dL (ref 0.61–1.24)
GFR calc non Af Amer: >60 mL/min (ref >60)
Glucose, Bld: 104 mg/dL — ABNORMAL HIGH (ref 70–99)
Potassium: 3.9 mmol/L (ref 3.5–5.1)
Sodium: 135 mmol/L (ref 135–145)

## 2020-07-24 LAB — CBC
HCT: 38.2 % — ABNORMAL LOW (ref 39.0–52.0)
Hemoglobin: 12.7 g/dL — ABNORMAL LOW (ref 13.0–17.0)
MCH: 27.2 pg (ref 26.0–34.0)
MCHC: 33.2 g/dL (ref 30.0–36.0)
MCV: 81.8 fL (ref 80.0–100.0)
Platelets: 120 10*3/uL — ABNORMAL LOW (ref 150–400)
RBC: 4.67 MIL/uL (ref 4.22–5.81)
RDW: 14.6 % (ref 11.5–15.5)
WBC: 9.2 10*3/uL (ref 4.0–10.5)
nRBC: 0 % (ref 0.0–0.2)

## 2020-07-24 MED ORDER — MELOXICAM 15 MG PO TABS: 15.0000 mg | ORAL_TABLET | Freq: Every day | ORAL | 0 refills | Status: DC

## 2020-07-24 MED ORDER — LEVOFLOXACIN 750 MG PO TABS: 750.0000 mg | ORAL_TABLET | Freq: Every day | ORAL | 0 refills | Status: DC

## 2020-07-24 MED FILL — MELOXICAM 15 MG TABLET: 15 | 14 days supply | Qty: 14 | Fill #0

## 2020-07-24 MED FILL — levoFLOXacin 750 MG TABS: 750 | 28 days supply | Qty: 28 | Fill #0

## 2020-07-24 NOTE — Progress Notes (Signed)
Patient discharged to home w/ family. Given all belongings, instructions, equipment. Demonstrated to patient switching from regular foley bag to leg bag. Patient verbalized understanding and able to teach back. Educated infection control and follow up.

## 2020-07-24 NOTE — Discharge Instructions (Signed)
You were discharged with a foley catheter. We will arrange for you to have this removed in our clinic early next week. We will also schedule you for a follow up appointment in about 2 weeks to see how you're doing after your recent hospital stay.   You were prescribed 4 weeks of an antibiotic called Levaquin. Please take this as prescribed.  For pain, you were prescribed a medication call Mobic or Meloxicam. This is a heavy-duty ibuprofen. Please do not take ibuprofen while take this medication. This medication helps with inflammation. If you are still having pain while taking this medication, you were also prescribed oxycodone 5mg  to take every 4 hours as needed. Oxycodone is a narcotic. Please wean yourself off oxycodone as soon as you are able. Narcotics can cause constipation, so take a stool softener while taking this medication. No driving or operating heavy machinery while on oxycodone.    Activity:  You are encouraged to ambulate frequently (about every hour during waking hours) to help prevent blood clots from forming in your legs or lungs.    What to call about: You should call the office [phone]) if you develop fever > 101 or develop persistent vomiting.

## 2020-07-24 NOTE — Discharge Summary (Signed)
Date of admission: 07/20/2020  Date of discharge: 07/24/2020  Admission diagnosis: Prostate abscess  Discharge diagnosis: Prostate abscess  Secondary diagnoses: SIRS (resolved)  History and Physical: For full details, please see admission history and physical. Briefly, Shaarav Ripple is a 34 y.o. male with history of E Coli UTI who presented to the ED on 10/4 with pelvic pain, fevers, and imaging demonstrating a 2.9 x 4.1 x 4.8 cm prostate abscess.   Hospital Course:  Patient presented to the ED on 10/4 with pelvic pain, fevers, and imaging demonstrating a 2.9 x 4.1 x 4.8 cm prostate abscess. He was started on broad spectrum antibiotics - initially cefepime x 1 dose and then switched to levaquin for better prostate penetration. His imaging was reviewed with IR, who initially didn't believe that percutaneous/trans-rectal drainage would be possible. Thus, he was taken for urgent transurethral unroofing of prostate abscess early on 10/5; however, we were unable to locate the abscess cavity and unavoidably made a small injury to the right prostate capsule during prostate resection. A 3-way foley catheter was placed, and the patient was started on CBI overnight.  On 10/5, his urine had cleared to yellow, so his CBI was stopped and 3rd foley port plugged. His imaging was again reviewed with IR, who felt that transrectal drainage of abscess might be possible. The patient underwent successful transrectal drainage of abscess later on 10/5 with removal of 30cc purulent fluid and placement of drain. He subsequently developed a severe SIRS response with fever to 105, tachycardia to 170s, and tachypnea requiring oxygen support. Therefore, he was transferred to the ICU. He quickly recovered to the baseline overnight with stabilization of vitals, normalization of lactate, and weaning to oxygen to room air. He was transferred to the floor later on 10/6.  Repeat CT Pelvis on 10/7 showed resolution of abscess cavity with  good position of transrectal drain. He was continued on levaquin during his hospitalization, and his leukocytosis. His urine culture was NG. Blood cultures x 2 NGTD. Drain culture with mixed flora but final results pending.   By 10/8, the patient was AFHDS with resolved leukocytosis and improving pelvic pain. He was thus deemed appropriate for discharge. His rectal drain output had decreased to 5cc, so it was removed prior to discharge. His foley catheter was kept to drainage for urethral healing.  Follow up early next week for RN foley catheter removal/TOV. Follow up in 2 weeks for post-op check.   Laboratory values: Recent Labs    07/22/20 0704 07/23/20 0603 07/24/20 0314  HGB 11.8* 12.5* 12.7*  HCT 35.0* 37.4* 38.2*   Recent Labs    07/23/20 0603 07/24/20 0314  CREATININE 1.03 1.03    Disposition: Home  Discharge instruction: The patient was instructed to be ambulatory but told to refrain from strenuous activity or driving.   Discharge medications:  Allergies as of 07/24/2020   No Known Allergies     Medication List    STOP taking these medications   HYDROcodone-acetaminophen 5-325 MG tablet Commonly known as: NORCO/VICODIN   ibuprofen 600 MG tablet Commonly known as: ADVIL     TAKE these medications   cyclobenzaprine 10 MG tablet Commonly known as: FLEXERIL Take 1 tablet (10 mg total) by mouth 2 (two) times daily as needed for muscle spasms.   levofloxacin 750 MG tablet Commonly known as: Levaquin Take 1 tablet (750 mg total) by mouth daily for 28 days.   meloxicam 15 MG tablet Commonly known as: MOBIC Take 1 tablet (15 mg  total) by mouth daily for 14 days.   traZODone 50 MG tablet Commonly known as: DESYREL Take 50 mg by mouth at bedtime.       Followup:   Follow-up Information    [facility]. Schedule an appointment as soon as possible for a visit.   Contact information: [address] [place]  [address] [phone]             Follow up early next week for RN foley catheter removal/TOV. Follow up in 2 weeks for post-op check.

## 2020-07-25 LAB — AEROBIC/ANAEROBIC CULTURE W GRAM STAIN (SURGICAL/DEEP WOUND)

## 2020-07-25 LAB — CULTURE, BLOOD (ROUTINE X 2)
Culture: NO GROWTH
Special Requests: ADEQUATE

## 2020-07-27 ENCOUNTER — Emergency Department (HOSPITAL_COMMUNITY)
Admission: EM | Admit: 2020-07-27 | Discharge: 2020-07-27 | Disposition: A | Discharge status: Home / Self Care | Payer: Self-pay | Attending: Emergency Medicine | Admitting: Emergency Medicine

## 2020-07-27 ENCOUNTER — Encounter (HOSPITAL_COMMUNITY): Payer: Self-pay

## 2020-07-27 ENCOUNTER — Other Ambulatory Visit: Payer: Self-pay

## 2020-07-27 DIAGNOSIS — R310 Gross hematuria: Secondary | ICD-10-CM

## 2020-07-27 DIAGNOSIS — F17210 Nicotine dependence, cigarettes, uncomplicated: Secondary | ICD-10-CM | POA: Insufficient documentation

## 2020-07-27 DIAGNOSIS — F1590 Other stimulant use, unspecified, uncomplicated: Secondary | ICD-10-CM | POA: Insufficient documentation

## 2020-07-27 DIAGNOSIS — R000 Tachycardia, unspecified: Secondary | ICD-10-CM | POA: Insufficient documentation

## 2020-07-27 DIAGNOSIS — N4829 Other inflammatory disorders of penis: Secondary | ICD-10-CM | POA: Insufficient documentation

## 2020-07-27 DIAGNOSIS — N4889 Other specified disorders of penis: Secondary | ICD-10-CM

## 2020-07-27 NOTE — ED Triage Notes (Addendum)
Pt states he was told to come back today for catheter removal. Pt states he has been having bleeding from his penis, pt states he has been bleeding since Friday. Pt states he is having pain 10/10 around penis and underneath it. Pt states he had a procedure on an abscess underneath penis 1 week ago and is having pain and tenderness.

## 2020-07-27 NOTE — Discharge Instructions (Addendum)
As discussed, your evaluation today has been largely reassuring.  But, it is important that you monitor your condition carefully, and do not hesitate to return to the ED if you develop new, or concerning changes in your condition. ? ?Otherwise, please follow-up with your physician for appropriate ongoing care. ? ?

## 2020-07-27 NOTE — ED Provider Notes (Signed)
Tyler [name]   CSN: [id number] Arrival date & time: 07/27/20  1056     History Chief Complaint  Patient presents with  . Foley Catheter Removal    Tyler [name] is a 34 y.o. male.  HPI     Patient presents 1 week postop following prostate abscess surgery now with request for evaluation of his Foley catheter. Patient notes that he was discharged 3 days ago, and since that time has had some mild pain around the catheter itself, but no nausea, vomiting, no fever, no abdominal pain. He notes that he was instructed to return 1 week after Foley catheter placement for removal, and evaluation.  Additional history obtained by our History reviewed. No pertinent past medical history.  Patient Active Problem List   Diagnosis Date Noted  . Prostate abscess 07/21/2020  . Acute respiratory failure with hypoxia (HCC)   . Sepsis (HCC)   . AKI (acute kidney injury) (HCC) 07/20/2020  . Abscess of prostate 07/20/2020    Past Surgical History:  Procedure Laterality Date  . TRANSURETHRAL RESECTION OF PROSTATE  07/21/2020   Procedure: TRANSURETHRAL RESECTION OF THE PROSTATE (TURP);  Surgeon: Rene Paci, MD;  Location: [facility] ORS;  Service: Urology;;       History reviewed. No pertinent family history.  Social History   Tobacco Use  . Smoking status: Current Every Day Smoker    Packs/day: 1.00    Types: Cigarettes  . Smokeless tobacco: Never Used  Substance Use Topics  . Alcohol use: Yes  . Drug use: Yes    Types: Marijuana    Home Medications Prior to Admission medications   Medication Sig Start Date End Date Taking? Authorizing Provider  cyclobenzaprine (FLEXERIL) 10 MG tablet Take 1 tablet (10 mg total) by mouth 2 (two) times daily as needed for muscle spasms. Patient not taking: Reported on 07/20/2020 11/15/12   Fayrene Helper, PA-C  levofloxacin (LEVAQUIN) 750 MG tablet Take 1 tablet (750 mg total) by mouth daily for 28  days. 07/24/20 08/21/20  Margette Fast, MD  meloxicam (MOBIC) 15 MG tablet Take 1 tablet (15 mg total) by mouth daily for 14 days. 07/24/20 08/07/20  Margette Fast, MD  traZODone (DESYREL) 50 MG tablet Take 50 mg by mouth at bedtime. 07/05/20   [provider]    Allergies    Patient has no known allergies.  Review of Systems   Review of Systems  Constitutional:       Per HPI, otherwise negative  HENT:       Per HPI, otherwise negative  Respiratory:       Per HPI, otherwise negative  Cardiovascular:       Per HPI, otherwise negative  Gastrointestinal: Positive for nausea. Negative for vomiting.  Endocrine:       Negative aside from HPI  Genitourinary:       Neg aside from HPI   Musculoskeletal:       Per HPI, otherwise negative  Skin: Negative.   Allergic/Immunologic: Negative for immunocompromised state.  Neurological: Negative for syncope.    Physical Exam Updated Vital Signs BP 121/72   Pulse (!) 121   Temp 99.6 F (37.6 C) (Oral)   Resp 18   SpO2 97%   Physical Exam Vitals and nursing [name] reviewed.  Constitutional:      General: He is not in acute distress.    Appearance: He is well-developed.  HENT:     Head: Normocephalic and atraumatic.  Eyes:  Conjunctiva/sclera: Conjunctivae normal.  Cardiovascular:     Rate and Rhythm: Regular rhythm. Tachycardia present.  Pulmonary:     Effort: Pulmonary effort is normal. No respiratory distress.     Breath sounds: No stridor.  Abdominal:     General: There is no distension.  Genitourinary:   Skin:    General: Skin is warm and dry.  Neurological:     Mental Status: He is alert and oriented to person, place, and time.     ED Results / Procedures / Treatments    I reviewed the patient's chart following initial evaluation including op notes from transurethral prostate debridement, drainage.  Subsequently discussed the patient's case with his urology team.  Patient appropriate for removal of  Foley catheter. Procedures Procedures (including critical care time)  Medications Ordered in ED Medications - No data to display  ED Course  I have reviewed the triage vital signs and the nursing notes.  Pertinent labs & imaging results that were available during my care of the patient were reviewed by me and considered in my medical decision making (see chart for details).   3:42 PM Patient has been able to urinate a second time, now approximately 200 mL of bloody urine.  Adult male with history of recent prostate surgery presents with discomfort about the site of his Foley catheter. Here the patient is awake, alert, has no fever, nausea, vomiting.  After discussion with our urology colleagues, the patient had removal of his Foley catheter.  Patient was able to urinate.  Absent other complaints, including fever, abdominal pain, patient appropriate for following up with urologist, which has been scheduled as well.   Final Clinical Impression(s) / ED Diagnoses Final diagnoses:  Penis pain     Gerhard Munch, MD 07/27/20 1542

## 2020-08-04 ENCOUNTER — Encounter (HOSPITAL_COMMUNITY): Payer: Self-pay

## 2020-08-04 ENCOUNTER — Inpatient Hospital Stay (HOSPITAL_COMMUNITY): Payer: Self-pay

## 2020-08-04 ENCOUNTER — Emergency Department (HOSPITAL_COMMUNITY): Payer: Self-pay

## 2020-08-04 ENCOUNTER — Encounter (HOSPITAL_COMMUNITY)
Admission: EM | Disposition: A | Discharge status: Home / Self Care | Payer: Self-pay | Source: Home / Self Care | Attending: Internal Medicine

## 2020-08-04 ENCOUNTER — Inpatient Hospital Stay (HOSPITAL_COMMUNITY)
Admission: EM | Admit: 2020-08-04 | Discharge: 2020-08-10 | DRG: 872 | Disposition: A | Discharge status: Home / Self Care | Payer: Self-pay | Attending: Internal Medicine | Admitting: Internal Medicine

## 2020-08-04 ENCOUNTER — Other Ambulatory Visit: Payer: Self-pay

## 2020-08-04 ENCOUNTER — Inpatient Hospital Stay (HOSPITAL_COMMUNITY): Payer: Self-pay | Admitting: Certified Registered Nurse Anesthetist

## 2020-08-04 DIAGNOSIS — F17210 Nicotine dependence, cigarettes, uncomplicated: Secondary | ICD-10-CM | POA: Diagnosis present

## 2020-08-04 DIAGNOSIS — Z23 Encounter for immunization: Secondary | ICD-10-CM

## 2020-08-04 DIAGNOSIS — L02215 Cutaneous abscess of perineum: Secondary | ICD-10-CM | POA: Diagnosis present

## 2020-08-04 DIAGNOSIS — N492 Inflammatory disorders of scrotum: Secondary | ICD-10-CM | POA: Diagnosis present

## 2020-08-04 DIAGNOSIS — A419 Sepsis, unspecified organism: Principal | ICD-10-CM | POA: Diagnosis present

## 2020-08-04 DIAGNOSIS — K5900 Constipation, unspecified: Secondary | ICD-10-CM | POA: Diagnosis not present

## 2020-08-04 DIAGNOSIS — E871 Hypo-osmolality and hyponatremia: Secondary | ICD-10-CM | POA: Diagnosis present

## 2020-08-04 DIAGNOSIS — Z79899 Other long term (current) drug therapy: Secondary | ICD-10-CM

## 2020-08-04 DIAGNOSIS — N5089 Other specified disorders of the male genital organs: Secondary | ICD-10-CM | POA: Diagnosis present

## 2020-08-04 DIAGNOSIS — Z20822 Contact with and (suspected) exposure to covid-19: Secondary | ICD-10-CM | POA: Diagnosis present

## 2020-08-04 DIAGNOSIS — N179 Acute kidney failure, unspecified: Secondary | ICD-10-CM | POA: Diagnosis present

## 2020-08-04 DIAGNOSIS — E860 Dehydration: Secondary | ICD-10-CM | POA: Diagnosis present

## 2020-08-04 DIAGNOSIS — D62 Acute posthemorrhagic anemia: Secondary | ICD-10-CM | POA: Diagnosis not present

## 2020-08-04 HISTORY — PX: IRRIGATION AND DEBRIDEMENT ABSCESS: SHX5252

## 2020-08-04 LAB — CBC WITH DIFFERENTIAL/PLATELET
Abs Immature Granulocytes: 0.19 10*3/uL — ABNORMAL HIGH (ref 0.00–0.07)
Basophils Absolute: 0.1 10*3/uL (ref 0.0–0.1)
Basophils Relative: 0 %
Eosinophils Absolute: 0.1 10*3/uL (ref 0.0–0.5)
Eosinophils Relative: 0 %
HCT: 35.9 % — ABNORMAL LOW (ref 39.0–52.0)
Hemoglobin: 12.3 g/dL — ABNORMAL LOW (ref 13.0–17.0)
Immature Granulocytes: 1 %
Lymphocytes Relative: 9 %
Lymphs Abs: 2 10*3/uL (ref 0.7–4.0)
MCH: 27 pg (ref 26.0–34.0)
MCHC: 34.3 g/dL (ref 30.0–36.0)
MCV: 78.9 fL — ABNORMAL LOW (ref 80.0–100.0)
Monocytes Absolute: 2 10*3/uL — ABNORMAL HIGH (ref 0.1–1.0)
Monocytes Relative: 9 %
Neutro Abs: 18.5 10*3/uL — ABNORMAL HIGH (ref 1.7–7.7)
Neutrophils Relative %: 81 %
Platelets: 452 10*3/uL — ABNORMAL HIGH (ref 150–400)
RBC: 4.55 MIL/uL (ref 4.22–5.81)
RDW: 13.6 % (ref 11.5–15.5)
WBC: 22.8 10*3/uL — ABNORMAL HIGH (ref 4.0–10.5)
nRBC: 0 % (ref 0.0–0.2)

## 2020-08-04 LAB — COMPREHENSIVE METABOLIC PANEL
ALT: 60 U/L — ABNORMAL HIGH (ref 0–44)
AST: 66 U/L — ABNORMAL HIGH (ref 15–41)
Albumin: 2.8 g/dL — ABNORMAL LOW (ref 3.5–5.0)
Alkaline Phosphatase: 58 U/L (ref 38–126)
Anion gap: 9 (ref 5–15)
BUN: 18 mg/dL (ref 6–20)
CO2: 26 mmol/L (ref 22–32)
Calcium: 8.5 mg/dL — ABNORMAL LOW (ref 8.9–10.3)
Chloride: 94 mmol/L — ABNORMAL LOW (ref 98–111)
Creatinine, Ser: 1.21 mg/dL (ref 0.61–1.24)
GFR, Estimated: >60 mL/min (ref >60)
Glucose, Bld: 95 mg/dL (ref 70–99)
Potassium: 4.1 mmol/L (ref 3.5–5.1)
Sodium: 129 mmol/L — ABNORMAL LOW (ref 135–145)
Total Bilirubin: 0.7 mg/dL (ref 0.3–1.2)
Total Protein: 8.4 g/dL — ABNORMAL HIGH (ref 6.5–8.1)

## 2020-08-04 LAB — RESPIRATORY PANEL BY RT PCR (FLU A&B, COVID)
Influenza A by PCR: NEGATIVE
Influenza B by PCR: NEGATIVE
SARS Coronavirus 2 by RT PCR: NEGATIVE

## 2020-08-04 LAB — URINALYSIS, ROUTINE W REFLEX MICROSCOPIC
Bilirubin Urine: NEGATIVE
Glucose, UA: NEGATIVE mg/dL
Ketones, ur: NEGATIVE mg/dL
Nitrite: NEGATIVE
Protein, ur: NEGATIVE mg/dL
Specific Gravity, Urine: >1.046 — ABNORMAL HIGH (ref 1.005–1.030)
WBC, UA: >50 WBC/hpf — ABNORMAL HIGH (ref 0–5)
pH: 6 (ref 5.0–8.0)

## 2020-08-04 LAB — LACTIC ACID, PLASMA
Lactic Acid, Venous: 1.7 mmol/L (ref 0.5–1.9)
Lactic Acid, Venous: 1.2 mmol/L (ref 0.5–1.9)

## 2020-08-04 SURGERY — IRRIGATION AND DEBRIDEMENT ABSCESS
Anesthesia: General | Site: Perineum

## 2020-08-04 MED ORDER — VANCOMYCIN HCL 1750 MG/350ML IV SOLN
1750.0000 mg | Freq: Once | INTRAVENOUS | Status: AC
  Administered 2020-08-04: 1750 mg via INTRAVENOUS
  Filled 2020-08-04: qty 350

## 2020-08-04 MED ORDER — SODIUM CHLORIDE 0.9 % IV SOLN
2.0000 g | Freq: Once | INTRAVENOUS | Status: AC
  Administered 2020-08-04: 2 g via INTRAVENOUS
  Filled 2020-08-04: qty 20

## 2020-08-04 MED ORDER — OXYCODONE-ACETAMINOPHEN 5-325 MG PO TABS
2.0000 | ORAL_TABLET | Freq: Once | ORAL | Status: AC
  Administered 2020-08-04: 2 via ORAL
  Filled 2020-08-04: qty 2

## 2020-08-04 MED ORDER — KETAMINE HCL 10 MG/ML IJ SOLN
INTRAMUSCULAR | Status: AC
  Filled 2020-08-04: qty 1

## 2020-08-04 MED ORDER — FENTANYL CITRATE (PF) 100 MCG/2ML IJ SOLN
INTRAMUSCULAR | Status: DC | PRN
Start: 2020-08-04 — End: 2020-08-04
  Administered 2020-08-04: 100 ug via INTRAVENOUS

## 2020-08-04 MED ORDER — HYDROMORPHONE HCL 1 MG/ML IJ SOLN
INTRAMUSCULAR | Status: AC
  Filled 2020-08-04: qty 1

## 2020-08-04 MED ORDER — FENTANYL CITRATE (PF) 250 MCG/5ML IJ SOLN
INTRAMUSCULAR | Status: AC
  Filled 2020-08-04: qty 5

## 2020-08-04 MED ORDER — DEXMEDETOMIDINE (PRECEDEX) IN NS 20 MCG/5ML (4 MCG/ML) IV SYRINGE
PREFILLED_SYRINGE | INTRAVENOUS | Status: AC
  Filled 2020-08-04: qty 5

## 2020-08-04 MED ORDER — MIDAZOLAM HCL 2 MG/2ML IJ SOLN
INTRAMUSCULAR | Status: AC
  Filled 2020-08-04: qty 2

## 2020-08-04 MED ORDER — SODIUM CHLORIDE 0.9 % IV BOLUS
1000.0000 mL | Freq: Once | INTRAVENOUS | Status: AC
  Administered 2020-08-04: 1000 mL via INTRAVENOUS

## 2020-08-04 MED ORDER — SUCCINYLCHOLINE CHLORIDE 200 MG/10ML IV SOSY
PREFILLED_SYRINGE | INTRAVENOUS | Status: DC | PRN
  Administered 2020-08-04: 100 mg via INTRAVENOUS

## 2020-08-04 MED ORDER — MIDAZOLAM HCL 5 MG/5ML IJ SOLN
INTRAMUSCULAR | Status: DC | PRN
  Administered 2020-08-04: 2 mg via INTRAVENOUS

## 2020-08-04 MED ORDER — OXYCODONE HCL 5 MG/5ML PO SOLN: 5.0000 mg | Freq: Once | ORAL | Status: DC | PRN

## 2020-08-04 MED ORDER — DEXMEDETOMIDINE HCL IN NACL 400 MCG/100ML IV SOLN
INTRAVENOUS | Status: DC | PRN
  Administered 2020-08-04 (×2): 8 ug via INTRAVENOUS

## 2020-08-04 MED ORDER — DEXAMETHASONE SODIUM PHOSPHATE 4 MG/ML IJ SOLN
INTRAMUSCULAR | Status: DC | PRN
  Administered 2020-08-04: 4 mg via INTRAVENOUS

## 2020-08-04 MED ORDER — OXYCODONE HCL 5 MG PO TABS: 5.0000 mg | ORAL_TABLET | Freq: Once | ORAL | Status: DC | PRN

## 2020-08-04 MED ORDER — LIDOCAINE 2% (20 MG/ML) 5 ML SYRINGE
INTRAMUSCULAR | Status: DC | PRN
  Administered 2020-08-04: 100 mg via INTRAVENOUS

## 2020-08-04 MED ORDER — HYDROMORPHONE HCL 1 MG/ML IJ SOLN
0.2500 mg | INTRAMUSCULAR | Status: DC | PRN
  Administered 2020-08-04 (×2): 0.25 mg via INTRAVENOUS

## 2020-08-04 MED ORDER — ONDANSETRON HCL 4 MG/2ML IJ SOLN
INTRAMUSCULAR | Status: DC | PRN
  Administered 2020-08-04: 4 mg via INTRAVENOUS

## 2020-08-04 MED ORDER — PROPOFOL 500 MG/50ML IV EMUL
INTRAVENOUS | Status: AC
  Filled 2020-08-04: qty 50

## 2020-08-04 MED ORDER — AMISULPRIDE (ANTIEMETIC) 5 MG/2ML IV SOLN: 10.0000 mg | Freq: Once | INTRAVENOUS | Status: DC | PRN

## 2020-08-04 MED ORDER — SODIUM CHLORIDE 0.9 % IV SOLN: INTRAVENOUS | Status: DC

## 2020-08-04 MED ORDER — ONDANSETRON HCL 4 MG/2ML IJ SOLN: 4.0000 mg | Freq: Once | INTRAMUSCULAR | Status: DC | PRN

## 2020-08-04 MED ORDER — PHENYLEPHRINE 40 MCG/ML (10ML) SYRINGE FOR IV PUSH (FOR BLOOD PRESSURE SUPPORT)
PREFILLED_SYRINGE | INTRAVENOUS | Status: DC | PRN
  Administered 2020-08-04 (×3): 80 ug via INTRAVENOUS

## 2020-08-04 MED ORDER — 0.9 % SODIUM CHLORIDE (POUR BTL) OPTIME
TOPICAL | Status: DC | PRN
  Administered 2020-08-04: 1000 mL

## 2020-08-04 MED ORDER — VANCOMYCIN HCL IN DEXTROSE 1-5 GM/200ML-% IV SOLN: 1000.0000 mg | Freq: Three times a day (TID) | INTRAVENOUS | Status: DC

## 2020-08-04 MED ORDER — ACETAMINOPHEN 325 MG PO TABS
650.0000 mg | ORAL_TABLET | Freq: Four times a day (QID) | ORAL | Status: DC | PRN
  Administered 2020-08-05 – 2020-08-10 (×5): 650 mg via ORAL
  Filled 2020-08-04 (×5): qty 2

## 2020-08-04 MED ORDER — INFLUENZA VAC SPLIT QUAD 0.5 ML IM SUSY
0.5000 mL | PREFILLED_SYRINGE | INTRAMUSCULAR | Status: AC
  Administered 2020-08-05: 0.5 mL via INTRAMUSCULAR
  Filled 2020-08-04: qty 0.5

## 2020-08-04 MED ORDER — PROPOFOL 10 MG/ML IV BOLUS
INTRAVENOUS | Status: DC | PRN
  Administered 2020-08-04: 200 mg via INTRAVENOUS

## 2020-08-04 MED ORDER — IOHEXOL 300 MG/ML  SOLN
100.0000 mL | Freq: Once | INTRAMUSCULAR | Status: AC | PRN
  Administered 2020-08-04: 100 mL via INTRAVENOUS

## 2020-08-04 MED ORDER — KETAMINE HCL 10 MG/ML IJ SOLN
INTRAMUSCULAR | Status: DC | PRN
  Administered 2020-08-04 (×2): 30 mg via INTRAVENOUS

## 2020-08-04 MED ORDER — LACTATED RINGERS IV BOLUS
1000.0000 mL | Freq: Once | INTRAVENOUS | Status: AC
  Administered 2020-08-04: 1000 mL via INTRAVENOUS

## 2020-08-04 MED ORDER — ACETAMINOPHEN 650 MG RE SUPP: 650.0000 mg | Freq: Four times a day (QID) | RECTAL | Status: DC | PRN

## 2020-08-04 MED ORDER — IOHEXOL 300 MG/ML  SOLN
INTRAMUSCULAR | Status: DC | PRN
  Administered 2020-08-04: 50 mL via URETHRAL

## 2020-08-04 MED ORDER — PHENYLEPHRINE 40 MCG/ML (10ML) SYRINGE FOR IV PUSH (FOR BLOOD PRESSURE SUPPORT)
PREFILLED_SYRINGE | INTRAVENOUS | Status: AC
  Filled 2020-08-04: qty 10

## 2020-08-04 MED ORDER — SODIUM CHLORIDE 0.9 % IV SOLN
2.0000 g | Freq: Three times a day (TID) | INTRAVENOUS | Status: DC
  Administered 2020-08-05 – 2020-08-10 (×17): 2 g via INTRAVENOUS
  Filled 2020-08-04 (×18): qty 2

## 2020-08-04 MED ORDER — LACTATED RINGERS IV SOLN: INTRAVENOUS | Status: DC | PRN

## 2020-08-04 SURGICAL SUPPLY — 56 items
BAG URINE DRAIN 2000ML AR STRL (UROLOGICAL SUPPLIES) ×6 IMPLANT
BAG URINE LEG 500ML (DRAIN) ×3 IMPLANT
BENZOIN TINCTURE PRP APPL 2/3 (GAUZE/BANDAGES/DRESSINGS) IMPLANT
BLADE HEX COATED 2.75 (ELECTRODE) IMPLANT
BLADE SURG 15 STRL LF DISP TIS (BLADE) IMPLANT
BLADE SURG 15 STRL SS (BLADE)
BLADE SURG SZ10 CARB STEEL (BLADE) IMPLANT
BNDG GAUZE ELAST 4 BULKY (GAUZE/BANDAGES/DRESSINGS) ×3 IMPLANT
CATH FOLEY 2WAY 5CC 16FR (CATHETERS) ×1
CATH FOLEY 2WAY SLVR  5CC 20FR (CATHETERS) ×1
CATH FOLEY 2WAY SLVR 5CC 20FR (CATHETERS) ×2 IMPLANT
CATH URET 5FR 28IN OPEN ENDED (CATHETERS) IMPLANT
CATH URTH STD 16FR FL 2W DRN (CATHETERS) ×2 IMPLANT
COVER SURGICAL LIGHT HANDLE (MISCELLANEOUS) ×3 IMPLANT
COVER WAND RF STERILE (DRAPES) IMPLANT
DECANTER SPIKE VIAL GLASS SM (MISCELLANEOUS) IMPLANT
DRAIN PENROSE 1X36 LTX NONSTRL (WOUND CARE) ×3 IMPLANT
DRAPE LAPAROSCOPIC ABDOMINAL (DRAPES) IMPLANT
DRAPE LAPAROTOMY T 102X78X121 (DRAPES) IMPLANT
DRAPE LAPAROTOMY TRNSV 102X78 (DRAPES) ×3 IMPLANT
DRAPE POUCH INSTRU U-SHP 10X18 (DRAPES) ×3 IMPLANT
DRAPE SHEET LG 3/4 BI-LAMINATE (DRAPES) IMPLANT
DRSG PAD ABDOMINAL 8X10 ST (GAUZE/BANDAGES/DRESSINGS) ×3 IMPLANT
ELECT REM PT RETURN 15FT ADLT (MISCELLANEOUS) ×6 IMPLANT
EVACUATOR SILICONE 100CC (DRAIN) IMPLANT
GAUZE SPONGE 4X4 12PLY STRL (GAUZE/BANDAGES/DRESSINGS) ×3 IMPLANT
GLOVE BIOGEL PI IND STRL 7.0 (GLOVE) ×2 IMPLANT
GLOVE BIOGEL PI INDICATOR 7.0 (GLOVE) ×1
GLOVE EUDERMIC 7 POWDERFREE (GLOVE) ×6 IMPLANT
GLOVE SURG SS PI 8.0 STRL IVOR (GLOVE) IMPLANT
GOWN STRL REUS W/TWL LRG LVL3 (GOWN DISPOSABLE) ×3 IMPLANT
GOWN STRL REUS W/TWL XL LVL3 (GOWN DISPOSABLE) ×6 IMPLANT
KIT BASIN OR (CUSTOM PROCEDURE TRAY) ×6 IMPLANT
KIT TURNOVER KIT A (KITS) IMPLANT
MANIFOLD NEPTUNE II (INSTRUMENTS) ×3 IMPLANT
NEEDLE HYPO 22GX1.5 SAFETY (NEEDLE) IMPLANT
NEEDLE HYPO 25X1 1.5 SAFETY (NEEDLE) ×6 IMPLANT
NS IRRIG 1000ML POUR BTL (IV SOLUTION) ×6 IMPLANT
PACK BASIC VI WITH GOWN DISP (CUSTOM PROCEDURE TRAY) ×6 IMPLANT
PACK CYSTO (CUSTOM PROCEDURE TRAY) ×3 IMPLANT
PENCIL SMOKE EVACUATOR (MISCELLANEOUS) IMPLANT
PLUG CATH AND CAP STER (CATHETERS) IMPLANT
SOL PREP POV-IOD 4OZ 10% (MISCELLANEOUS) ×3 IMPLANT
SPONGE DRAIN TRACH 4X4 STRL 2S (GAUZE/BANDAGES/DRESSINGS) ×3 IMPLANT
SPONGE LAP 18X18 RF (DISPOSABLE) ×3 IMPLANT
SPONGE LAP 4X18 RFD (DISPOSABLE) ×6 IMPLANT
STAPLER VISISTAT 35W (STAPLE) IMPLANT
STRIP CLOSURE SKIN 1/2X4 (GAUZE/BANDAGES/DRESSINGS) IMPLANT
SUT ETHILON 2 0 PS N (SUTURE) ×3 IMPLANT
SUT ETHILON 3 0 PS 1 (SUTURE) ×3 IMPLANT
SUT MNCRL AB 4-0 PS2 18 (SUTURE) IMPLANT
SUT VIC AB 3-0 SH 18 (SUTURE) IMPLANT
SYR CONTROL 10ML LL (SYRINGE) ×6 IMPLANT
TOWEL OR 17X26 10 PK STRL BLUE (TOWEL DISPOSABLE) ×3 IMPLANT
WATER STERILE IRR 1000ML POUR (IV SOLUTION) ×3 IMPLANT
WATER STERILE IRR 3000ML UROMA (IV SOLUTION) ×3 IMPLANT

## 2020-08-04 NOTE — Op Note (Signed)
Procedure: 1.  Retrograde urethrogram with interpretation. 2.  Cystoscopy. 3.  Incision and drainage of perineal and scrotal abscess.  Preop diagnosis: 1.  Abscess of the perineum and scrotum.  Postop diagnosis: Same.  Surgeon: Dr. Bjorn Pippin.  Anesthesia: General.  Specimen: Aerobic and anaerobic wound cultures.  Drains: 16 French coud Foley catheter and half inch Penrose wound drain.  EBL: 10 mL.  Complications: None.  Indications: Tyler [name] is a 34 year old male who was recently discharged from the hospital following cystoscopy with transurethral incision of the prostate followed by transanal drainage of a post prostatic abscess.  He had his Foley catheter removed a week ago Monday and the wound drained prior to that.  Over the last few days she has had progressive pain and swelling in the scrotum primarily on the right.  A CT in the emergency room demonstrated a loculated fluid collection with a greatest dimension of 10.8 cm extending from the base of the penis and the scrotum into the post prostatic space.  It was felt that incision and drainage was indicated as well as further evaluation of the urethra.  Procedure: He was given ceftriaxone and vancomycin.  He was taken the operating room where a general anesthetic was induced.  He was placed in the lithotomy position and his lower abdomen was clipped.  He was fitted with PAS hose.  He was prepped with Betadine solution and draped in usual sterile fashion.  A retrograde urethrogram was performed using a [place]y syringe and Omnipaque.  The retrograde pyelogram revealed a normal caliber urethra with possibly some mild narrowing in the proximal bulb.  There was free flow into the bladder but there was dye that accumulated in the area of the prostate that appeared to go beneath the bladder shadow and more lateral than I would anticipate for the prostate and could represent some posterior extravasation but it did not extend into the larger  fluid collection in the perineum and scrotum.  Cystoscopy was then performed using a 23 [place] scope and 30 degree lens.  Examination revealed a normal urethra to the bulb where in the proximal bulb there was some blanched mucosa that was suggestive of a recently treated stricture.  The external sphincter was intact.  The prostatic urethra had a normal verumontanum but proximal to that there was a large amount of necrotic tissue but no fluid pus.  Visualization was somewhat poor due to this necrotic tissue.  Inspection of bladder revealed some of the necrotic tissue in the bladder which was aspirated out.  Further inspection revealed a smooth bladder wall without significant mucosal lesion and normal ureteral orifices.  The cystoscope was removed and a 16 French coud Foley catheter was inserted.  The balloon was filled with 10 mL of sterile fluid and the catheter subsequently placed to straight drainage and the procedure.  In the superior lateral right scrotum a  3 cm incision was made along the skin lines with a knife.  This was carried down to the dark toes with the Bovie and a Kelly clamp was used to puncture through into the abscess cavity.  Once the cavity had been entered there was prompt drainage of approximately 50 mL of foul-smelling thick pus.  Aerobic and anaerobic cultures were obtained but the odor was consistent with a coliform bacteria.  The wound was then probed to break up loculations all the way back to the perineum.  Over gloves were placed and a bimanual exam was performed to ensure that my dissection  was sufficiently posterior.  1/2 inch Penrose drain was then placed into the cavity and advanced posteriorly using a [employer].  The drain was then secured using a 3-0 nylon stitch.  The wounds were cleansed and the drapes were removed.  A dressing of 4 x 4's, fluff Kerlix and a scrotal support was applied.  He was taken down from lithotomy position, his anesthetic was reversed and he was moved  recovery in stable condition.  There were no complications.

## 2020-08-04 NOTE — Transfer of Care (Signed)
Immediate Anesthesia Transfer of Care Note  Patient: Tyler [name]  Procedure(s) Performed: IRRIGATION AND DEBRIDEMENT PERINEAL ABSCESS, CYSTOSCOPY, URETHRAGRAM (N/A Perineum)  Patient Location: PACU  Anesthesia Type:General  Level of Consciousness: drowsy  Airway & Oxygen Therapy: Patient Spontanous Breathing and Patient connected to face mask  Post-op Assessment: Report given to RN and Post -op Vital signs reviewed and stable  Post vital signs: Reviewed and stable  Last Vitals:  Vitals Value Taken Time  BP    Temp 36.7 C 08/04/20 2230  Pulse 101 08/04/20 2231  Resp 23 08/04/20 2231  SpO2 93 % 08/04/20 2231  Vitals shown include unvalidated device data.  Last Pain:  Vitals:   08/04/20 2108  TempSrc: Oral  PainSc:          Complications: No complications documented.

## 2020-08-04 NOTE — Progress Notes (Addendum)
Pharmacy Antibiotic Note  Tyler [name] is a 34 y.o. male admitted on 08/04/2020 with recurrent periurethral abscess.  Pharmacy has been consulted for vancomycin/cefepime dosing.  Plan: Vanc 1750mg  IV x 1 then 1g IV q8 Cefepime 2g IV q8  Height: 6' (182.9 cm) Weight: 81.6 kg (180 lb) IBW/kg (Calculated) : 77.6  Temp (24hrs), Avg:99.9 F (37.7 C), Min:99.4 F (37.4 C), Max:100.4 F (38 C)  Recent Labs  Lab 08/04/20 1706  WBC 22.8*  CREATININE 1.21  LATICACIDVEN 1.7    Estimated Creatinine Clearance: 96.2 mL/min (by C-G formula based on SCr of 1.21 mg/dL).    No Known Allergies   Thank you for allowing pharmacy to be a part of this patient's care.  08/06/20 08/04/2020 8:27 PM

## 2020-08-04 NOTE — Anesthesia Procedure Notes (Signed)
Procedure Name: Intubation Date/Time: 08/04/2020 9:37 PM Performed by: Vanessa Alcolu, CRNA Pre-anesthesia Checklist: Patient identified, Emergency Drugs available, Suction available and Patient being monitored Patient Re-evaluated:Patient Re-evaluated prior to induction Oxygen Delivery Method: Circle system utilized Preoxygenation: Pre-oxygenation with 100% oxygen Induction Type: IV induction, Rapid sequence and Cricoid Pressure applied Ventilation: Mask ventilation without difficulty Laryngoscope Size: 2 and Miller Grade View: Grade I Tube type: Oral Tube size: 8.0 mm Number of attempts: 1 Airway Equipment and Method: Stylet Placement Confirmation: ETT inserted through vocal cords under direct vision,  positive ETCO2 and breath sounds checked- equal and bilateral Tube secured with: Tape Dental Injury: Teeth and Oropharynx as per pre-operative assessment

## 2020-08-04 NOTE — Anesthesia Preprocedure Evaluation (Addendum)
Anesthesia Evaluation  Patient identified by MRN, date of birth, ID band Patient awake    Reviewed: Allergy & Precautions, NPO status , Patient's Chart, lab work & pertinent test results  History of Anesthesia Complications Negative for: history of anesthetic complications  Airway Mallampati: II  TM Distance: >3 FB Neck ROM: Full    Dental   Pulmonary Current Smoker,    Pulmonary exam normal        Cardiovascular negative cardio ROS Normal cardiovascular exam     Neuro/Psych negative neurological ROS  negative psych ROS   GI/Hepatic negative GI ROS, Neg liver ROS,   Endo/Other  negative endocrine ROS  Renal/GU negative Renal ROS   Perineal/prostate abscess    Musculoskeletal negative musculoskeletal ROS (+)   Abdominal   Peds  Hematology negative hematology ROS (+)   Anesthesia Other Findings   Reproductive/Obstetrics                            Anesthesia Physical Anesthesia Plan  ASA: III and emergent  Anesthesia Plan: General   Post-op Pain Management:    Induction: Intravenous and Rapid sequence  PONV Risk Score and Plan: 2 and Ondansetron, Dexamethasone, Treatment may vary due to age or medical condition and Midazolam  Airway Management Planned: Oral ETT  Additional Equipment: None  Intra-op Plan:   Post-operative Plan: Extubation in OR  Informed Consent: I have reviewed the patients History and Physical, chart, labs and discussed the procedure including the risks, benefits and alternatives for the proposed anesthesia with the patient or authorized representative who has indicated his/her understanding and acceptance.     Dental advisory given  Plan Discussed with:   Anesthesia Plan Comments:        Anesthesia Quick Evaluation

## 2020-08-04 NOTE — Consult Note (Addendum)
Subjective:  CC: Scrotal pain   Hx: Tyler [name] is a 34 yo AAM who I was asked to see in consultation by Dr. Doran Stabler.  Tyler [name] was hospitalized earlier this month for a pelvic abscess that was first felt to be prostatic but transurethral incision of the prostate didn't result in detection of the abscess and he subsequently underwent drainage by IR.  He was found to have some necrotic appearing urethra and a foley was placed at the time of the procedure.  He was found to have strep angionosis on culture and is current on Levaquin.   He had the drainage tubes removed prior to discharge and the foley removed on Monday, 07/27/20, and has been voiding without complaints but over the last few days he has developed progressive scrotal and perineal swelling and pain.  He denies fever. He has had no nausea.  He came to the ER where he was found to have a low grade fever and a leukocytosis with a WBC count of 22.8. He is hyponatremic with a Sodium of 129.   A CT was done which demonstrates recurrence of the periurethral fluid collection with extension now into the right hemiscrotum with the greatest dimension 10.8cm.  He has voided in the urinal and the urine is light pink.  UA is not available in today's labs.  He has had several ED visits over the last several months including on for cystitis in 7/20 and a CT then just showed a thick bladder wall.   ROS:  Review of Systems  Constitutional: Negative for fever.  All other systems reviewed and are negative.   No Known Allergies  History reviewed. No pertinent past medical history.  Past Surgical History:  Procedure Laterality Date  . TRANSURETHRAL RESECTION OF PROSTATE  07/21/2020   Procedure: TRANSURETHRAL RESECTION OF THE PROSTATE (TURP);  Surgeon: Rene Paci, MD;  Location: [facility];  Service: Urology;;    Social History   Socioeconomic History  . Marital status: Single    Spouse name: Not on file  . Number of children: Not on file  .  Years of education: Not on file  . Highest education level: Not on file  Occupational History  . Not on file  Tobacco Use  . Smoking status: Current Every Day Smoker    Packs/day: 1.00    Types: Cigarettes  . Smokeless tobacco: Never Used  Vaping Use  . Vaping Use: Never used  Substance and Sexual Activity  . Alcohol use: Yes  . Drug use: Yes    Types: Marijuana  . Sexual activity: Not on file  Other Topics Concern  . Not on file  Social History Narrative  . Not on file   Social Determinants of Health   Financial Resource Strain:   . Difficulty of Paying Living Expenses: Not on file  Food Insecurity:   . Worried About Programme researcher, broadcasting/film/video in the Last Year: Not on file  . Ran Out of Food in the Last Year: Not on file  Transportation Needs:   . Lack of Transportation (Medical): Not on file  . Lack of Transportation (Non-Medical): Not on file  Physical Activity:   . Days of Exercise per Week: Not on file  . Minutes of Exercise per Session: Not on file  Stress:   . Feeling of Stress : Not on file  Social Connections:   . Frequency of Communication with Friends and Family: Not on file  . Frequency of Social Gatherings with Friends  and Family: Not on file  . Attends Religious Services: Not on file  . Active Member of Clubs or Organizations: Not on file  . Attends BankerClub or Organization Meetings: Not on file  . Marital Status: Not on file  Intimate Partner Violence:   . Fear of Current or Ex-Partner: Not on file  . Emotionally Abused: Not on file  . Physically Abused: Not on file  . Sexually Abused: Not on file    No family history on file.  Anti-infectives: Anti-infectives (From admission, onward)   Start     Dose/Rate Route Frequency Ordered Stop   08/04/20 1700  cefTRIAXone (ROCEPHIN) 2 g in sodium chloride 0.9 % 100 mL IVPB        2 g 200 mL/hr over 30 Minutes Intravenous  Once 08/04/20 1648 08/04/20 1728      No current facility-administered medications for  this encounter.   Current Outpatient Medications  Medication Sig Dispense Refill  . levofloxacin (LEVAQUIN) 750 MG tablet Take 1 tablet (750 mg total) by mouth daily for 28 days. 28 tablet 0  . meloxicam (MOBIC) 15 MG tablet Take 1 tablet (15 mg total) by mouth daily for 14 days. 14 tablet 0  . traZODone (DESYREL) 50 MG tablet Take 50 mg by mouth at bedtime.    . cyclobenzaprine (FLEXERIL) 10 MG tablet Take 1 tablet (10 mg total) by mouth 2 (two) times daily as needed for muscle spasms. (Patient not taking: Reported on 07/20/2020) 20 tablet 0     Objective: Vital signs in last 24 hours: BP 110/75   Pulse 80   Temp 99.4 F (37.4 C) (Oral)   Resp 18   Ht 6' (1.829 m)   Wt 81.6 kg   SpO2 100%   BMI 24.41 kg/m   Intake/Output from previous day: No intake/output data recorded. Intake/Output this shift: No intake/output data recorded.   Physical Exam Vitals reviewed.  Constitutional:      Appearance: Normal appearance.  HENT:     Head: Normocephalic and atraumatic.  Cardiovascular:     Rate and Rhythm: Normal rate and regular rhythm.     Heart sounds: Normal heart sounds.  Pulmonary:     Effort: Pulmonary effort is normal. No respiratory distress.     Breath sounds: Normal breath sounds.  Abdominal:     General: Abdomen is flat.     Palpations: Abdomen is soft. There is no mass.     Tenderness: There is no abdominal tenderness.  Genitourinary:    Comments: He has a normal phallus with an adequate meatus. Scrotum is swollen on the right with tenderness but no fluctuance or crepitus.  Testicles and epdidymis are not readily palpable.  There is tender, indurated swelling of the perineum back to near the anus.   Musculoskeletal:        General: No swelling or tenderness. Normal range of motion.  Skin:    General: Skin is warm and dry.  Neurological:     General: No focal deficit present.     Mental Status: He is alert and oriented to person, place, and time.  Psychiatric:         Mood and Affect: Mood normal.        Behavior: Behavior normal.     Lab Results:  Results for orders placed or performed during the hospital encounter of 08/04/20 (from the past 24 hour(s))  Comprehensive metabolic panel     Status: Abnormal   Collection Time: 08/04/20  5:06  PM  Result Value Ref Range   Sodium 129 (L) 135 - 145 mmol/L   Potassium 4.1 3.5 - 5.1 mmol/L   Chloride 94 (L) 98 - 111 mmol/L   CO2 26 22 - 32 mmol/L   Glucose, Bld 95 70 - 99 mg/dL   BUN 18 6 - 20 mg/dL   Creatinine, Ser 4.85 0.61 - 1.24 mg/dL   Calcium 8.5 (L) 8.9 - 10.3 mg/dL   Total Protein 8.4 (H) 6.5 - 8.1 g/dL   Albumin 2.8 (L) 3.5 - 5.0 g/dL   AST 66 (H) 15 - 41 U/L   ALT 60 (H) 0 - 44 U/L   Alkaline Phosphatase 58 38 - 126 U/L   Total Bilirubin 0.7 0.3 - 1.2 mg/dL   GFR, Estimated >46 >27 mL/min   Anion gap 9 5 - 15  CBC with Differential     Status: Abnormal   Collection Time: 08/04/20  5:06 PM  Result Value Ref Range   WBC 22.8 (H) 4.0 - 10.5 K/uL   RBC 4.55 4.22 - 5.81 MIL/uL   Hemoglobin 12.3 (L) 13.0 - 17.0 g/dL   HCT 03.5 (L) 39 - 52 %   MCV 78.9 (L) 80.0 - 100.0 fL   MCH 27.0 26.0 - 34.0 pg   MCHC 34.3 30.0 - 36.0 g/dL   RDW 00.9 38.1 - 82.9 %   Platelets 452 (H) 150 - 400 K/uL   nRBC 0.0 0.0 - 0.2 %   Neutrophils Relative % 81 %   Neutro Abs 18.5 (H) 1.7 - 7.7 K/uL   Lymphocytes Relative 9 %   Lymphs Abs 2.0 0.7 - 4.0 K/uL   Monocytes Relative 9 %   Monocytes Absolute 2.0 (H) 0.1 - 1.0 K/uL   Eosinophils Relative 0 %   Eosinophils Absolute 0.1 0.0 - 0.5 K/uL   Basophils Relative 0 %   Basophils Absolute 0.1 0.0 - 0.1 K/uL   Immature Granulocytes 1 %   Abs Immature Granulocytes 0.19 (H) 0.00 - 0.07 K/uL  Lactic acid, plasma     Status: None   Collection Time: 08/04/20  5:06 PM  Result Value Ref Range   Lactic Acid, Venous 1.7 0.5 - 1.9 mmol/L    BMET Recent Labs    08/04/20 1706  NA 129*  K 4.1  CL 94*  CO2 26  GLUCOSE 95  BUN 18  CREATININE 1.21   CALCIUM 8.5*   PT/INR No results for input(s): LABPROT, INR in the last 72 hours. ABG No results for input(s): PHART, HCO3 in the last 72 hours.  Invalid input(s): PCO2, PO2  Studies/Results: CT PELVIS W CONTRAST  Result Date: 08/04/2020 CLINICAL DATA:  Right testicular swelling. EXAM: CT PELVIS WITH CONTRAST TECHNIQUE: Multidetector CT imaging of the pelvis was performed using the standard protocol following the bolus administration of intravenous contrast. CONTRAST:  OMNIPAQUE IOHEXOL 300 MG/ML  SOLN COMPARISON:  July 23, 2020 FINDINGS: Urinary Tract: No abnormality visualized. The Foley catheter seen within the urinary bladder on the prior study has been removed. Bowel:  Unremarkable visualized pelvic bowel loops. Vascular/Lymphatic: No pathologically enlarged lymph nodes. No significant vascular abnormality seen. Reproductive: The small pigtail drainage catheter seen the expected region of the prostate and base of the penis on the prior study has been removed. A large, approximately 3.4 cm x 10.8 cm x 4.6 cm lobulated, complex fluid collection, with surrounding mildly hyperdense wall, is seen involving from the region just below the prostate gland, with inferior  extension to include the base of the penis, with subsequent anterior extension into the scrotal wall on the right. This represents a new finding when compared to the prior study. A moderate to marked amount of surrounding soft tissue edema and inflammatory fat stranding is seen. No soft tissue air is identified Other:  None. Musculoskeletal: No acute osseous abnormalities are identified. IMPRESSION: 1. Interval removal of the small pigtail drainage catheter and Foley catheter seen on the prior study. 2. Large perineal abscess involving the region just below the prostate gland, base of the penis and right scrotal wall, which represents a new finding when compared to the prior study. Electronically Signed   By: Aram Candela M.D.    On: 08/04/2020 18:07   I have reviewed his prior records, labs and imaging and discussed his case with the EDP.    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   I am going to take him to the OR for I&D of the abscess.  I will also do a retrograde urethrogram and cystoscopy with possible suprapubic tube insertion.   I have reviewed the risks of bleeding, infection, injury to adjacent structures, need for SP tube insertion, need for secondary procedures, thrombotic events and anesthetic complications.        CC: Dr. Doran Stabler and Dr. Rhoderick Moody.      Tyler [name] 08/04/2020 [phone]

## 2020-08-04 NOTE — Anesthesia Postprocedure Evaluation (Signed)
Anesthesia Post Note  Patient: Tyler [name]  Procedure(s) Performed: IRRIGATION AND DEBRIDEMENT PERINEAL ABSCESS, CYSTOSCOPY, URETHRAGRAM (N/A Perineum)     Patient location during evaluation: PACU Anesthesia Type: General Level of consciousness: awake and alert Pain management: pain level controlled Vital Signs Assessment: post-procedure vital signs reviewed and stable Respiratory status: spontaneous breathing, nonlabored ventilation, respiratory function stable and patient connected to nasal cannula oxygen Cardiovascular status: blood pressure returned to baseline and stable Postop Assessment: no apparent nausea or vomiting Anesthetic complications: no   No complications documented.  Last Vitals:  Vitals:   08/04/20 2108 08/04/20 2230  BP: 111/68   Pulse: (!) 112   Resp: 17   Temp: 37 C 36.7 C  SpO2: 100%     Last Pain:  Vitals:   08/04/20 2108  TempSrc: Oral  PainSc:                  Lucretia Kern

## 2020-08-04 NOTE — ED Triage Notes (Signed)
Patient reports that he had surgery on his prostate. Patient states he is now having"swelling of the right testicle and states their is a hard knot present." patient also c/o increased pain.  patient states he was suppose to meet with Dr.Winter, but did not have a ride and when he called they said he had left for the day.

## 2020-08-04 NOTE — ED Provider Notes (Signed)
[facility]-EMERGENCY DEPT Provider Note   CSN: [id number] Arrival date & time: 08/04/20  1547     History Chief Complaint  Patient presents with  . post op issues    Tyler [name] is a 34 y.o. male with no known past medical history presents to the ED for right scrotal swelling for the past 3 days.  Patient had a TURP procedure for prostate abscess on 07/21/2020.  He was seen in the ED on 07/27/2020 for penile pain and had his Foley catheter removed.  Patient missed his urology appointment this morning due to transportation issue.  He states that pain started 1 day after he was discharged from the ED.  Pain located at the right scrotum, does not radiate, described pain as sharp, rated 10/10.  Patient stated that he just had a fever this morning of 100.4.  He denies chest pain, shortness of breath, abdominal pain.  Patient states he has pain with urination on the first day but resolved after.  Denies hematuria, urgency or bowels issue  HPI     History reviewed. No pertinent past medical history.  Patient Active Problem List   Diagnosis Date Noted  . 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 Prostate abscess 07/21/2020  . Acute respiratory failure with hypoxia (HCC)   . Sepsis (HCC)   . AKI (acute kidney injury) (HCC) 07/20/2020  . Abscess of prostate 07/20/2020    Past Surgical History:  Procedure Laterality Date  . TRANSURETHRAL RESECTION OF PROSTATE  07/21/2020   Procedure: TRANSURETHRAL RESECTION OF THE PROSTATE (TURP);  Surgeon: Rene Paci, MD;  Location: [facility] ORS;  Service: Urology;;       History reviewed. No pertinent family history.  Social History   Tobacco Use  . Smoking status: Current Every Day Smoker    Packs/day: 1.00    Types: Cigarettes  . Smokeless tobacco: Never Used  Vaping Use  . Vaping Use: Never used  Substance Use Topics  . Alcohol use: Yes  . Drug use: Yes    Types: Marijuana    Home Medications Prior to Admission  medications   Medication Sig Start Date End Date Taking? Authorizing Provider  levofloxacin (LEVAQUIN) 750 MG tablet Take 1 tablet (750 mg total) by mouth daily for 28 days. 07/24/20 08/21/20 Yes Margette Fast, MD  meloxicam (MOBIC) 15 MG tablet Take 1 tablet (15 mg total) by mouth daily for 14 days. 07/24/20 08/07/20 Yes Margette Fast, MD  traZODone (DESYREL) 50 MG tablet Take 50 mg by mouth at bedtime. 07/05/20  Yes [provider]  cyclobenzaprine (FLEXERIL) 10 MG tablet Take 1 tablet (10 mg total) by mouth 2 (two) times daily as needed for muscle spasms. Patient not taking: Reported on 07/20/2020 11/15/12   Fayrene Helper, PA-C    Allergies    Patient has no known allergies.  Review of Systems   Review of Systems  Constitutional: Positive for appetite change and fever.  Respiratory: Negative for shortness of breath.   Cardiovascular: Negative for chest pain and leg swelling.  Gastrointestinal: Negative for abdominal pain, nausea and vomiting.  Genitourinary: Positive for frequency, scrotal swelling and testicular pain. Negative for hematuria and urgency.    Physical Exam Updated Vital Signs BP 110/75   Pulse 80   Temp 99.4 F (37.4 C) (Oral)   Resp 18   Ht 6' (1.829 m)   Wt 81.6 kg   SpO2 100%   BMI 24.41 kg/m   Physical Exam Constitutional:  General: He is in acute distress.  HENT:     Head: Normocephalic.  Eyes:     General:        Right eye: No discharge.        Left eye: No discharge.  Cardiovascular:     Rate and Rhythm: Regular rhythm. Tachycardia present.     Heart sounds: No murmur heard.   Pulmonary:     Effort: No respiratory distress.     Breath sounds: Normal breath sounds. No wheezing.  Abdominal:     General: Bowel sounds are normal. There is no distension.     Palpations: Abdomen is soft.     Tenderness: There is no abdominal tenderness. There is no guarding.  Genitourinary:    Comments: Swollen right scrotum.  Very painful to  palpation. Also tenderness at the perineum region.  Unable to do a prostate exam due to pain. Skin:    General: Skin is warm.  Neurological:     Mental Status: He is alert.     ED Results / Procedures / Treatments   Labs (all labs ordered are listed, but only abnormal results are displayed) Labs Reviewed  COMPREHENSIVE METABOLIC PANEL - Abnormal; Notable for the following components:      Result Value   Sodium 129 (*)    Chloride 94 (*)    Calcium 8.5 (*)    Total Protein 8.4 (*)    Albumin 2.8 (*)    AST 66 (*)    ALT 60 (*)    All other components within normal limits  CBC WITH DIFFERENTIAL/PLATELET - Abnormal; Notable for the following components:   WBC 22.8 (*)    Hemoglobin 12.3 (*)    HCT 35.9 (*)    MCV 78.9 (*)    Platelets 452 (*)    Neutro Abs 18.5 (*)    Monocytes Absolute 2.0 (*)    Abs Immature Granulocytes 0.19 (*)    All other components within normal limits  RESPIRATORY PANEL BY RT PCR (FLU A&B, COVID)  CULTURE, BLOOD (ROUTINE X 2)  CULTURE, BLOOD (ROUTINE X 2)  LACTIC ACID, PLASMA  URINALYSIS, ROUTINE W REFLEX MICROSCOPIC  LACTIC ACID, PLASMA    EKG None  Radiology CT PELVIS W CONTRAST  Result Date: 08/04/2020 CLINICAL DATA:  Right testicular swelling. EXAM: CT PELVIS WITH CONTRAST TECHNIQUE: Multidetector CT imaging of the pelvis was performed using the standard protocol following the bolus administration of intravenous contrast. CONTRAST:  OMNIPAQUE IOHEXOL 300 MG/ML  SOLN COMPARISON:  July 23, 2020 FINDINGS: Urinary Tract: No abnormality visualized. The Foley catheter seen within the urinary bladder on the prior study has been removed. Bowel:  Unremarkable visualized pelvic bowel loops. Vascular/Lymphatic: No pathologically enlarged lymph nodes. No significant vascular abnormality seen. Reproductive: The small pigtail drainage catheter seen the expected region of the prostate and base of the penis on the prior study has been removed. A  large, approximately 3.4 cm x 10.8 cm x 4.6 cm lobulated, complex fluid collection, with surrounding mildly hyperdense wall, is seen involving from the region just below the prostate gland, with inferior extension to include the base of the penis, with subsequent anterior extension into the scrotal wall on the right. This represents a new finding when compared to the prior study. A moderate to marked amount of surrounding soft tissue edema and inflammatory fat stranding is seen. No soft tissue air is identified Other:  None. Musculoskeletal: No acute osseous abnormalities are identified. IMPRESSION: 1. Interval removal of  the small pigtail drainage catheter and Foley catheter seen on the prior study. 2. Large perineal abscess involving the region just below the prostate gland, base of the penis and right scrotal wall, which represents a new finding when compared to the prior study. Electronically Signed   By: Aram Candela M.D.   On: 08/04/2020 18:07    Procedures Procedures (including critical care time)  Medications Ordered in ED Medications  vancomycin (VANCOREADY) IVPB 1750 mg/350 mL (1,750 mg Intravenous New Bag/Given 08/04/20 2051)  vancomycin (VANCOCIN) IVPB 1000 mg/200 mL premix (has no administration in time range)  influenza vac split quadrivalent PF (FLUARIX) injection 0.5 mL (has no administration in time range)  cefTRIAXone (ROCEPHIN) 2 g in sodium chloride 0.9 % 100 mL IVPB (0 g Intravenous Stopped 08/04/20 1728)  lactated ringers bolus 1,000 mL (0 mLs Intravenous Stopped 08/04/20 1850)  oxyCODONE-acetaminophen (PERCOCET/ROXICET) 5-325 MG per tablet 2 tablet (2 tablets Oral Given 08/04/20 1728)  iohexol (OMNIPAQUE) 300 MG/ML solution 100 mL (100 mLs Intravenous Contrast Given 08/04/20 1731)  sodium chloride 0.9 % bolus 1,000 mL (0 mLs Intravenous Stopped 08/04/20 2023)    ED Course  I have reviewed the triage vital signs and the nursing notes.  Pertinent labs & imaging results  that were available during my care of the patient were reviewed by me and considered in my medical decision making (see chart for details).  Patient seen and examined.  He has significant swelling of the right scrotum and significant tenderness to palpation of the scrotum and perineal body.  CT pelvis reveals perineal abscess.  2/4 SIRS criteria with fever and tachycardia.  He received 2 L of fluid.  Rocephin 2 g was started.  Also obtain blood culture.  Consulted Dr. Annabell Howells, urology.  Start n.p.o. for possible surgeries today.  Pain is controlled with Percocet.  Heart rate improved to 80.  Blood pressure stable at 110/75.  BP 125/73 (BP Location: Left Arm)   Pulse (!) 118   Temp 99.4 F (37.4 C) (Oral)   Resp 20   Ht 6' (1.829 m)   Wt 81.6 kg   SpO2 97%   BMI 24.41 kg/m   Patient is plan for surgery tonight.  He will be admitted to the hospital for management of his sepsis.  Signed out to Dr. Toniann Fail.     MDM Rules/Calculators/A&P                          Patient presents to the ED for pain and swelling of the right scrotum.  He had a TURP procedure on 07/21/2020 for prostate abscess.  He was seen in the ED on 10/11/121 for penile pain and had his Foley catheter removed.  Patient states that he is taking his antibiotic.  On physical exam, he has significant swelling and tenderness of the right scrotum extend to the perineal body.  CT pelvis reveals a large perineal abscess.  Patient fits SIRS criteria 2/4 and received 2 L of fluid.  Antibiotics with Rocephin and vancomycin.  Pain controlled with Percocet.  Blood pressure stable at 110/75 and heart rates improved to 80.  Urology Dr. Annabell Howells was consulted and planned for surgery tonight.  Patient will be admitted to the hospitalist service for management of his sepsis. Signed out to Dr. Toniann Fail.    Final Clinical Impression(s) / ED Diagnoses Final diagnoses:  Perineal abscess    Rx / DC Orders ED Discharge Orders    None  Doran StablerNguyen, Genine Beckett, DO 08/04/20 2102    Alvira MondaySchlossman, Erin, MD 08/05/20 [id number]

## 2020-08-05 ENCOUNTER — Inpatient Hospital Stay (HOSPITAL_COMMUNITY): Payer: Self-pay

## 2020-08-05 ENCOUNTER — Encounter (HOSPITAL_COMMUNITY): Payer: Self-pay | Admitting: Urology

## 2020-08-05 DIAGNOSIS — L02215 Cutaneous abscess of perineum: Secondary | ICD-10-CM

## 2020-08-05 DIAGNOSIS — N170 Acute kidney failure with tubular necrosis: Secondary | ICD-10-CM

## 2020-08-05 DIAGNOSIS — A419 Sepsis, unspecified organism: Principal | ICD-10-CM

## 2020-08-05 DIAGNOSIS — R6520 Severe sepsis without septic shock: Secondary | ICD-10-CM

## 2020-08-05 LAB — CBC
HCT: 32 % — ABNORMAL LOW (ref 39.0–52.0)
Hemoglobin: 10.9 g/dL — ABNORMAL LOW (ref 13.0–17.0)
MCH: 27 pg (ref 26.0–34.0)
MCHC: 34.1 g/dL (ref 30.0–36.0)
MCV: 79.4 fL — ABNORMAL LOW (ref 80.0–100.0)
Platelets: 403 10*3/uL — ABNORMAL HIGH (ref 150–400)
RBC: 4.03 MIL/uL — ABNORMAL LOW (ref 4.22–5.81)
RDW: 13.6 % (ref 11.5–15.5)
WBC: 25.9 10*3/uL — ABNORMAL HIGH (ref 4.0–10.5)
nRBC: 0 % (ref 0.0–0.2)

## 2020-08-05 LAB — COMPREHENSIVE METABOLIC PANEL
ALT: 47 U/L — ABNORMAL HIGH (ref 0–44)
AST: 39 U/L (ref 15–41)
Albumin: 2.5 g/dL — ABNORMAL LOW (ref 3.5–5.0)
Alkaline Phosphatase: 55 U/L (ref 38–126)
Anion gap: 10 (ref 5–15)
BUN: 17 mg/dL (ref 6–20)
CO2: 26 mmol/L (ref 22–32)
Calcium: 8.2 mg/dL — ABNORMAL LOW (ref 8.9–10.3)
Chloride: 97 mmol/L — ABNORMAL LOW (ref 98–111)
Creatinine, Ser: 0.95 mg/dL (ref 0.61–1.24)
GFR, Estimated: >60 mL/min (ref >60)
Glucose, Bld: 152 mg/dL — ABNORMAL HIGH (ref 70–99)
Potassium: 4.4 mmol/L (ref 3.5–5.1)
Sodium: 133 mmol/L — ABNORMAL LOW (ref 135–145)
Total Bilirubin: 0.5 mg/dL (ref 0.3–1.2)
Total Protein: 7.5 g/dL (ref 6.5–8.1)

## 2020-08-05 MED ORDER — IOHEXOL 300 MG/ML  SOLN
100.0000 mL | Freq: Once | INTRAMUSCULAR | Status: AC | PRN
  Administered 2020-08-05: 100 mL via INTRAVENOUS

## 2020-08-05 MED ORDER — SODIUM CHLORIDE 0.9 % IV SOLN: INTRAVENOUS | Status: DC

## 2020-08-05 MED ORDER — CHLORHEXIDINE GLUCONATE CLOTH 2 % EX PADS
6.0000 | MEDICATED_PAD | Freq: Every day | CUTANEOUS | Status: DC
  Administered 2020-08-05 – 2020-08-10 (×6): 6 via TOPICAL

## 2020-08-05 MED ORDER — DOCUSATE SODIUM 100 MG PO CAPS
100.0000 mg | ORAL_CAPSULE | Freq: Every day | ORAL | Status: DC | PRN
  Administered 2020-08-06 – 2020-08-08 (×2): 100 mg via ORAL
  Filled 2020-08-05 (×2): qty 1

## 2020-08-05 MED ORDER — OXYCODONE-ACETAMINOPHEN 5-325 MG PO TABS: 1.0000 | ORAL_TABLET | Freq: Four times a day (QID) | ORAL | Status: DC | PRN

## 2020-08-05 MED ORDER — POLYETHYLENE GLYCOL 3350 17 G PO PACK
17.0000 g | PACK | Freq: Every day | ORAL | Status: DC
  Administered 2020-08-05 – 2020-08-06 (×2): 17 g via ORAL
  Filled 2020-08-05 (×2): qty 1

## 2020-08-05 MED ORDER — OXYCODONE-ACETAMINOPHEN 5-325 MG PO TABS
1.0000 | ORAL_TABLET | Freq: Four times a day (QID) | ORAL | Status: DC | PRN
  Administered 2020-08-05 – 2020-08-07 (×8): 1 via ORAL
  Filled 2020-08-05 (×8): qty 1

## 2020-08-05 MED ORDER — METRONIDAZOLE IN NACL 5-0.79 MG/ML-% IV SOLN
500.0000 mg | Freq: Three times a day (TID) | INTRAVENOUS | Status: DC
  Administered 2020-08-05 – 2020-08-10 (×16): 500 mg via INTRAVENOUS
  Filled 2020-08-05 (×16): qty 100

## 2020-08-05 MED ORDER — VANCOMYCIN HCL 1500 MG/300ML IV SOLN
1500.0000 mg | Freq: Two times a day (BID) | INTRAVENOUS | Status: DC
  Administered 2020-08-05 – 2020-08-10 (×11): 1500 mg via INTRAVENOUS
  Filled 2020-08-05 (×12): qty 300

## 2020-08-05 NOTE — Progress Notes (Signed)
Pharmacy Antibiotic Note  Tyler [name] is a 34 y.o. male admitted on 08/04/2020 with Periurethral abscess.  Pharmacy has been consulted for vancomycin and cefepime dosing.  Pt is 62 yoM with recent hospitalization for pelvic abscess requiring drainage and Foley placement for necrotic urethra. Culture + for strep anginosis and prescribed levofloxacin on discharge. Foley removed 07/27/20. Pt presented back to ED on 10/19 with scrotal pain. Taken to OR for I&D on 10/19.  Significant Events: -10/19: I&D  Today, 08/05/20  WBC 25.9  SCr 1.21 --> 0.95, CrCl > 80 mL/min  Tmax 100.4 F  Pt currently prescribed cefepime, will escalate ABX by adding vancomycin + metronidazole per discussion with [facility].   Plan:  Continue cefepime 2 g IV q8h  Vancomycin 1750 mg IV LD given 10/19. Order maintenance dose of vancomycin 1500 mg IV q12h for goal VT 10-20 mcg/mL and/or AUC 400-550  Metronidazole 500 mg IV q8h  Follow renal function and culture data  Height: 6' (182.9 cm) Weight: 81.6 kg (180 lb) IBW/kg (Calculated) : 77.6  Temp (24hrs), Avg:99.1 F (37.3 C), Min:97.7 F (36.5 C), Max:100.4 F (38 C)  Recent Labs  Lab 08/04/20 1706 08/04/20 2000 08/05/20 0513  WBC 22.8*  --  25.9*  CREATININE 1.21  --  0.95  LATICACIDVEN 1.7 1.2  --     Estimated Creatinine Clearance: 122.5 mL/min (by C-G formula based on SCr of 0.95 mg/dL).    No Known Allergies  Antimicrobials this admission: vancomycin 10/19 >>  cefepime 10/19 >>  Metronidazole 10/20 >>  Dose adjustments this admission:  Microbiology results: 10/19 BCx:  10/19 Wound:   Thank you for allowing pharmacy to be a part of this patient's care.  Cindi Carbon, PharmD 08/05/2020 9:34 AM

## 2020-08-05 NOTE — Progress Notes (Signed)
PROGRESS NOTE   Tyler [name]  NOI:[id number]    DOB: 06-29-1986    DOA: 08/04/2020  PCP: Patient, No Pcp Per   I have briefly reviewed patients previous medical records in [facility].  Chief Complaint  Patient presents with  . post op issues    Brief Narrative:  34 year old male with history of recent admission 10/4-10/05/2020 for prostate abscess s/p unsuccessful transurethral unroofing followed by transrectal drain placement with wound cultures growing strep anginosis, drain removed prior to discharge, continued on levofloxacin, has been following up with urology, had his Foley catheter removed on 07/27/2020, presented to the ED due to worsening pain and swelling involving the perineum which moved towards the right scrotum and base of the penis.  Admitted for sepsis due to perineal/right scrotal abscess, s/p I&D of abscess on night of admission.  Improving.   Assessment & Plan:  Principal Problem:   Sepsis ([place]) Active Problems:   Perineal abscess   Sepsis due to perineal/right scrotal abscess, POA:  Met sepsis criteria on admission.  S/p cystourethroscopy and I&D of abscess by urology in OR on 10/19  Follow-up CT pelvis with contrast 10/20: Complete evacuation of the drainable fluid component noted on prior exam.  Placement of Penrose drain catheter within the abscess cavity which abuts the bulbar urethra.  Direct communication between the abscess cavity in the bulbar urethra is difficult to exclude on this exam.  Persistent diffuse low-attenuation of the prostate gland, nonspecific without discrete drainable fluid collection identified.  As per urology follow-up, continue Foley catheter for 1 to 2 weeks to allow prostatic urethra to heal  Was on IV cefepime this morning, broadened to include IV vancomycin and Flagyl pending final culture results.  Lactate normal.  Blood cultures x2: Negative to date.  Abscess culture pending.  Acute blood loss anemia  Hemoglobin is  dropped from 12.3 preop to 10.9, likely related to surgery and IV fluid dilution.  Follow CBC in a.m. and transfuse if hemoglobin 7 g or less.  Leukocytosis  Secondary to infectious etiology as above.  Trend daily CBC.  Hyponatremia  Presented with sodium of 129.  Possibly a component of dehydration in the context of sepsis.  Improved to 133.  Follow BMP in a.m.  Acute kidney injury:  Creatinine has improved from 1.21 on admission to 0.95.  Resolved.  Body mass index is 24.41 kg/m.   DVT prophylaxis: SCDs Start: 08/04/20 2102     Code Status: Full Code Family Communication: None at bedside. Disposition:  Status is: Inpatient  Remains inpatient appropriate because:Inpatient level of care appropriate due to severity of illness   Dispo: The patient is from: Home              Anticipated d/c is to: Home              Anticipated d/c date is: > 3 days              Patient currently is not medically stable to d/c.        Consultants:   Urology  Procedures:   As noted above  Antimicrobials:    Anti-infectives (From admission, onward)   Start     Dose/Rate Route Frequency Ordered Stop   08/05/20 1100  vancomycin (VANCOREADY) IVPB 1500 mg/300 mL        1,500 mg 150 mL/hr over 120 Minutes Intravenous Every 12 hours 08/05/20 0919     08/05/20 1000  metroNIDAZOLE (FLAGYL) IVPB 500 mg  500 mg 100 mL/hr over 60 Minutes Intravenous Every 8 hours 08/05/20 0908     08/05/20 0600  vancomycin (VANCOCIN) IVPB 1000 mg/200 mL premix  Status:  Discontinued        1,000 mg 200 mL/hr over 60 Minutes Intravenous Every 8 hours 08/04/20 2029 08/05/20 0321   08/04/20 2200  ceFEPIme (MAXIPIME) 2 g in sodium chloride 0.9 % 100 mL IVPB        2 g 200 mL/hr over 30 Minutes Intravenous Every 8 hours 08/04/20 2104     08/04/20 2030  vancomycin (VANCOREADY) IVPB 1750 mg/350 mL        1,750 mg 175 mL/hr over 120 Minutes Intravenous  Once 08/04/20 2029 08/05/20 0747   08/04/20  1700  cefTRIAXone (ROCEPHIN) 2 g in sodium chloride 0.9 % 100 mL IVPB        2 g 200 mL/hr over 30 Minutes Intravenous  Once 08/04/20 1648 08/04/20 1728        Subjective:  Reports that he feels much better compared to admission.  Scrotal area pain only with movement.  Otherwise pain much improved.  Denies any other complaints.  Was asking if he could have more coffee.  Objective:   Vitals:   08/05/20 0347 08/05/20 0748 08/05/20 1219 08/05/20 1608  BP: 114/69 123/76 113/71 116/71  Pulse: 93 100 90 92  Resp: 20 16 18 16   Temp: 97.7 F (36.5 C) 98.1 F (36.7 C) 98.2 F (36.8 C) 98.4 F (36.9 C)  TempSrc: Oral Oral Oral Oral  SpO2: 97% 98%  98%  Weight:      Height:        General exam: Pleasant young male, moderately built and nourished lying comfortably propped up in bed without distress. Respiratory system: Clear to auscultation. Respiratory effort normal. Cardiovascular system: S1 & S2 heard, RRR. No JVD, murmurs, rubs, gallops or clicks. No pedal edema. Gastrointestinal system: Abdomen is nondistended, soft and nontender. No organomegaly or masses felt. Normal bowel sounds heard. GU: Foley catheter in place draining straw-colored urine.  Scrotal area dressing clean and dry, did not attempt to remove and examined-deferred to urology/primary surgeons. Central nervous system: Alert and oriented. No focal neurological deficits. Extremities: Symmetric 5 x 5 power. Skin: No rashes, lesions or ulcers Psychiatry: Judgement and insight appear normal. Mood & affect appropriate.     Data Reviewed:   I have personally reviewed following labs and imaging studies   CBC: Recent Labs  Lab 08/04/20 1706 08/05/20 0513  WBC 22.8* 25.9*  NEUTROABS 18.5*  --   HGB 12.3* 10.9*  HCT 35.9* 32.0*  MCV 78.9* 79.4*  PLT 452* 403*    Basic Metabolic Panel: Recent Labs  Lab 08/04/20 1706 08/05/20 0513  NA 129* 133*  K 4.1 4.4  CL 94* 97*  CO2 26 26  GLUCOSE 95 152*  BUN 18 17   CREATININE 1.21 0.95  CALCIUM 8.5* 8.2*    Liver Function Tests: Recent Labs  Lab 08/04/20 1706 08/05/20 0513  AST 66* 39  ALT 60* 47*  ALKPHOS 58 55  BILITOT 0.7 0.5  PROT 8.4* 7.5  ALBUMIN 2.8* 2.5*    CBG: No results for input(s): GLUCAP in the last 168 hours.  Microbiology Studies:   Recent Results (from the past 240 hour(s))  Blood culture (routine x 2)     Status: None (Preliminary result)   Collection Time: 08/04/20  4:45 PM   Specimen: BLOOD  Result Value Ref Range Status  Specimen Description   Final    BLOOD RIGHT ANTECUBITAL Performed at [facility] [address] [place], [place] [address]    Special Requests   Final    BOTTLES DRAWN AEROBIC AND ANAEROBIC Blood Culture adequate volume Performed at [facility] [address], [place], [place] [address]    Culture   Final    NO GROWTH < 24 HOURS Performed at [facility] [address] [place], [place] [address]    Report Status PENDING  Incomplete  Blood culture (routine x 2)     Status: None (Preliminary result)   Collection Time: 08/04/20  5:06 PM   Specimen: BLOOD LEFT FOREARM  Result Value Ref Range Status   Specimen Description   Final    BLOOD LEFT FOREARM Performed at [facility] [address], [place], [place] [address]    Special Requests   Final    BOTTLES DRAWN AEROBIC AND ANAEROBIC Blood Culture adequate volume Performed at [facility] [address] [place], [place] [address]    Culture   Final    NO GROWTH < 24 HOURS Performed at [facility] [address], [place], [place] [address]    Report Status PENDING  Incomplete  Respiratory Panel by RT PCR (Flu A&B, Covid) - Nasopharyngeal Swab     Status: None   Collection Time: 08/04/20  6:53 PM   Specimen: Nasopharyngeal Swab  Result Value Ref Range Status   SARS Coronavirus 2 by RT PCR NEGATIVE NEGATIVE Final    Comment:  (NOTE) SARS-CoV-2 target nucleic acids are NOT DETECTED.  The SARS-CoV-2 RNA is generally detectable in upper respiratoy specimens during the acute phase of infection. The lowest concentration of SARS-CoV-2 viral copies this assay can detect is 131 copies/mL. A negative result does not preclude SARS-Cov-2 infection and should not be used as the sole basis for treatment or other patient management decisions. A negative result may occur with  improper specimen collection/handling, submission of specimen other than nasopharyngeal swab, presence of viral mutation(s) within the areas targeted by this assay, and inadequate number of viral copies (<131 copies/mL). A negative result must be combined with clinical observations, patient history, and epidemiological information. The expected result is Negative.  Fact Sheet for Patients:  PinkCheek.be  Fact Sheet for Healthcare Providers:  GravelBags.it  This test is no t yet approved or cleared by the [place] FDA and  has been authorized for detection and/or diagnosis of SARS-CoV-2 by FDA under an Emergency Use Authorization (EUA). This EUA will remain  in effect (meaning this test can be used) for the duration of the COVID-19 declaration under Section 564(b)(1) of the Act, 21 U.S.C. section 360bbb-3(b)(1), unless the authorization is terminated or revoked sooner.     Influenza A by PCR NEGATIVE NEGATIVE Final   Influenza B by PCR NEGATIVE NEGATIVE Final    Comment: (NOTE) The Xpert Xpress SARS-CoV-2/FLU/RSV assay is intended as an aid in  the diagnosis of influenza from Nasopharyngeal swab specimens and  should not be used as a sole basis for treatment. Nasal washings and  aspirates are unacceptable for Xpert Xpress SARS-CoV-2/FLU/RSV  testing.  Fact Sheet for Patients: PinkCheek.be  Fact Sheet for Healthcare  Providers: GravelBags.it  This test is not yet approved or cleared by the [place] FDA and  has been authorized for detection and/or diagnosis of SARS-CoV-2 by  FDA under an Emergency Use Authorization (EUA). This EUA  will remain  in effect (meaning this test can be used) for the duration of the  Covid-19 declaration under Section 564(b)(1) of the Act, 21  U.S.C. section 360bbb-3(b)(1), unless the authorization is  terminated or revoked. Performed at [facility], [place] [address], [place], [place] [address]   Aerobic/Anaerobic Culture (surgical/deep wound)     Status: None (Preliminary result)   Collection Time: 08/04/20 10:22 PM   Specimen: Wound; Abscess  Result Value Ref Range Status   Specimen Description   Final    WOUND Performed at [facility] [address], [place], [place] [address]    Special Requests   Final    NONE Performed at [facility], [place] [address], [place], [place] [address]    Gram Stain   Final    ABUNDANT WBC PRESENT, PREDOMINANTLY PMN ABUNDANT GRAM NEGATIVE RODS ABUNDANT GRAM POSITIVE COCCI IN PAIRS IN CHAINS IN CLUSTERS FEW GRAM POSITIVE RODS Performed at [facility], [place] [address], [place], [place] [address]    Culture PENDING  Incomplete   Report Status PENDING  Incomplete     Radiology Studies:  CT PELVIS W CONTRAST  Result Date: 08/05/2020 CLINICAL DATA:  Rectal abscess EXAM: CT PELVIS WITH CONTRAST TECHNIQUE: Multidetector CT imaging of the pelvis was performed using the standard protocol following the bolus administration of intravenous contrast. CONTRAST:  145m OMNIPAQUE IOHEXOL 300 MG/ML  SOLN COMPARISON:  08/04/2020 FINDINGS: Urinary Tract: A Foley catheter has been placed with the retaining balloon seen within the bladder lumen. There is subtotal decompression of the bladder lumen. The distal ureters appear decompressed. The previously noted  abscess appears to involve the base of the penis, as described below. Bowel: The visualized small and large bowel are unremarkable. There has developed mild free fluid within the pelvis, new from prior examination. Vascular/Lymphatic: The visualized pelvic vasculature is unremarkable. No pathologic adenopathy is seen within the abdomen and pelvis. Reproductive: Inflammatory changes within the base of the penis efface the borders of the prostate gland. Similar prior examination, the gland demonstrates diffuse hypoenhancement, nonspecific. No discrete intraparenchymal fluid collections are identified. Seminal vesicles are unremarkable. Other: The previously identified loculated fluid collection within the right hemiscrotum extending into the base of the penis has been evacuated. A Penrose drain is seen within the abscess cavity which appears to abut the bulbar urethra as indicated by the indwelling catheter. A small amount of gas is seen within the residual cavity. No residual drainable fluid collection is identified. There is effacement of the fat planes related to interstitial edema. Musculoskeletal: The osseous structures are unremarkable. IMPRESSION: Interval incision and drainage of the peroneal abscess involving the right hemiscrotum and base of the penis with complete evacuation of the drainable fluid component noted on prior examination. Placement of a Penrose drainage catheter within the abscess cavity which abuts the bulbar urethra. Direct communication between the abscess cavity and the bulbar urethra is difficult to exclude on this examination. Persistent diffuse low attenuation of the a prostate gland, nonspecific, without discrete drainable fluid collection identified. Interval development of mild free fluid within the pelvis. Electronically Signed   By: AFidela SalisburyMD   On: 08/05/2020 15:50   CT PELVIS W CONTRAST  Result Date: 08/04/2020 CLINICAL DATA:  Right testicular swelling. EXAM: CT PELVIS  WITH CONTRAST TECHNIQUE: Multidetector CT imaging of the pelvis was performed using the standard protocol following the bolus administration of intravenous contrast. CONTRAST:  1037mOMNIPAQUE IOHEXOL 300 MG/ML  SOLN COMPARISON:  July 23, 2020 FINDINGS: Urinary Tract: No abnormality visualized. The Foley catheter seen within the urinary bladder on the prior study has been removed. Bowel:  Unremarkable visualized pelvic bowel loops. Vascular/Lymphatic: No pathologically enlarged lymph nodes. No significant vascular abnormality seen. Reproductive: The small pigtail drainage catheter seen the expected region of the prostate and base of the penis on the prior study has been removed. A large, approximately 3.4 cm x 10.8 cm x 4.6 cm lobulated, complex fluid collection, with surrounding mildly hyperdense wall, is seen involving from the region just below the prostate gland, with inferior extension to include the base of the penis, with subsequent anterior extension into the scrotal wall on the right. This represents a new finding when compared to the prior study. A moderate to marked amount of surrounding soft tissue edema and inflammatory fat stranding is seen. No soft tissue air is identified Other:  None. Musculoskeletal: No acute osseous abnormalities are identified. IMPRESSION: 1. Interval removal of the small pigtail drainage catheter and Foley catheter seen on the prior study. 2. Large perineal abscess involving the region just below the prostate gland, base of the penis and right scrotal wall, which represents a new finding when compared to the prior study. Electronically Signed   By: Virgina Norfolk M.D.   On: 08/04/2020 18:07   DG C-Arm 1-60 Min-No Report  Result Date: 08/04/2020 Fluoroscopy was utilized by the requesting physician.  No radiographic interpretation.     Scheduled Meds:   . Chlorhexidine Gluconate Cloth  6 each Topical Daily    Continuous Infusions:   . sodium chloride 100 mL/hr  at 08/04/20 2319  . ceFEPime (MAXIPIME) IV 2 g (08/05/20 1623)  . metronidazole 500 mg (08/05/20 1017)  . vancomycin 1,500 mg (08/05/20 1159)     LOS: 1 day     Vernell Leep, MD, [place], [facility]. [facility]    To contact the attending provider between 7A-7P or the covering provider during after hours 7P-7A, please log into the web site [email or web address] and access using universal [place] password for that web site. If you do not have the password, please call the hospital operator.  08/05/2020, 4:56 PM

## 2020-08-05 NOTE — Progress Notes (Signed)
Pt transferred from bed to chair, tolerated it well. Pt experienced a small increase of drainage from surgical incision. Pt was tranferred back to bed due to increased pain from sitting on scrotum. Pt's dressing was changed.  Val [place]

## 2020-08-05 NOTE — Progress Notes (Signed)
1 Day Post-Op Subjective: S/p RUG, cystourethroscopy, and I&D of perineal/right scrotal abscess yesterday evening. Afebrile since with normalization of HR overnight; has remained normotensive. Doing well this morning with improved scrotal pain. Slightly increased leukocytosis. Wound cultures pending/   Objective: Vital signs in last 24 hours: Temp:  [97.7 F (36.5 C)-100.4 F (38 C)] 98.1 F (36.7 C) (10/20 0748) Pulse Rate:  [80-134] 100 (10/20 0748) Resp:  [16-30] 16 (10/20 0748) BP: (110-143)/(68-99) 123/76 (10/20 0748) SpO2:  [94 %-100 %] 98 % (10/20 0748) Weight:  [81.6 kg] 81.6 kg (10/19 1559)  Intake/Output from previous day: 10/19 0701 - 10/20 0700 In: 3448.3 [P.O.:480; I.V.:1768.3; IV Piggyback:1200] Out: 1075 [Urine:1075] Intake/Output this shift: Total I/O In: 240 [P.O.:240] Out: 1050 [Urine:1050]  Physical Exam:  General: Alert and oriented, no distress CV: Regular rate Lungs: NWOB on RA GU: Foley catheter in place draining yellow urine. Mild erythema and tenderness of right scrotum. No induration or crepitus. Penrose drain in place at right superolateral aspect of scrotum without noticeable purulent drainage Ext: NT, No erythema  Lab Results: Recent Labs    08/04/20 1706 08/05/20 0513  HGB 12.3* 10.9*  HCT 35.9* 32.0*   BMET Recent Labs    08/04/20 1706 08/05/20 0513  NA 129* 133*  K 4.1 4.4  CL 94* 97*  CO2 26 26  GLUCOSE 95 152*  BUN 18 17  CREATININE 1.21 0.95  CALCIUM 8.5* 8.2*     Studies/Results: CT PELVIS W CONTRAST  Result Date: 08/04/2020 CLINICAL DATA:  Right testicular swelling. EXAM: CT PELVIS WITH CONTRAST TECHNIQUE: Multidetector CT imaging of the pelvis was performed using the standard protocol following the bolus administration of intravenous contrast. CONTRAST:  OMNIPAQUE IOHEXOL 300 MG/ML  SOLN COMPARISON:  July 23, 2020 FINDINGS: Urinary Tract: No abnormality visualized. The Foley catheter seen within the urinary  bladder on the prior study has been removed. Bowel:  Unremarkable visualized pelvic bowel loops. Vascular/Lymphatic: No pathologically enlarged lymph nodes. No significant vascular abnormality seen. Reproductive: The small pigtail drainage catheter seen the expected region of the prostate and base of the penis on the prior study has been removed. A large, approximately 3.4 cm x 10.8 cm x 4.6 cm lobulated, complex fluid collection, with surrounding mildly hyperdense wall, is seen involving from the region just below the prostate gland, with inferior extension to include the base of the penis, with subsequent anterior extension into the scrotal wall on the right. This represents a new finding when compared to the prior study. A moderate to marked amount of surrounding soft tissue edema and inflammatory fat stranding is seen. No soft tissue air is identified Other:  None. Musculoskeletal: No acute osseous abnormalities are identified. IMPRESSION: 1. Interval removal of the small pigtail drainage catheter and Foley catheter seen on the prior study. 2. Large perineal abscess involving the region just below the prostate gland, base of the penis and right scrotal wall, which represents a new finding when compared to the prior study. Electronically Signed   By: Aram Candela M.D.   On: 08/04/2020 18:07   DG C-Arm 1-60 Min-No Report  Result Date: 08/04/2020 Fluoroscopy was utilized by the requesting physician.  No radiographic interpretation.    Assessment/Plan: 34yo male with recent hospitalization 10/4-10/8/21 for prostate abscess s/p unsuccessful transurethral unroofing followed by transrectal drain placement with wound cultures growing strep anginosis. Drain was removed prior to hospital discharge, and patient was continued on Levaquin for 4 weeks. Unfortunately, he re-presented to the ED on  10/19 with imaging demonstrating recurrent periurethral abscess with perineal and scrotal involvement. He is now s/p  RUG (showed persistent injury of right lateral prostate capsule unavoidably made during initial cystoscopic procedure), cystourethroscopy, and I&D of perineal abscess with placement of penrose drain.  He is clinically improving after I&D and on broad spectrum antibiotics with vanc/cefepime.  - Repeat CT Pelvis today to ensure all abscess cavity evacuated - Continue foley catheter for ~1-2 weeks to allow prostatic urethra to heal - Continue broad spectrum antibiotics. Follow up cultures, and narrow/adjust antibiotics accordingly   LOS: 1 day   Margette Fast 08/05/2020, 11:50 AM

## 2020-08-05 NOTE — H&P (Signed)
History and Physical    Tyler [name] ONG:[id number] DOB: 1986-10-14 DOA: 08/04/2020  PCP: Patient, No Pcp Per  Patient coming from: Home.  Chief Complaint: Perineal pain.  HPI: Tyler [name] is a 34 y.o. male with history of recent admission for prostate abscess status post drainage had been following up with urologist and had his Foley catheter removed on July 27, 2020 as outpatient has been having increasing pain and swelling involving the perineum which moved towards the right scrotum and base of his penis.  Due to worsening pain patient came to the ER.  Patient already had a follow-up appointment with his urologist Dr. Liliane Shi.  Denies nausea vomiting diarrhea fever chills chest pain or shortness of breath.  ED Course: In the ER patient was febrile with temperature of 101 F tachycardic in the 120s sodium 129 leukocytosis of 22,000 and CT scan showing worsening abscess on the perineum extending up to the right scrotum and base of his penis patient was taken to the OR for surgery.  Patient was placed on empiric antibiotic for sepsis fluids admitted for further management.  Covid test was negative.  I examined the patient before patient went to the OR.  Review of Systems: As per HPI, rest all negative.   History reviewed. No pertinent past medical history.  Past Surgical History:  Procedure Laterality Date  . TRANSURETHRAL RESECTION OF PROSTATE  07/21/2020   Procedure: TRANSURETHRAL RESECTION OF THE PROSTATE (TURP);  Surgeon: Rene Paci, MD;  Location: [facility];  Service: Urology;;     reports that he has been smoking cigarettes. He has been smoking about 1.00 pack per day. He has never used smokeless tobacco. He reports current alcohol use. He reports current drug use. Drug: Marijuana.  No Known Allergies  History reviewed. No pertinent family history.  Prior to Admission medications   Medication Sig Start Date End Date Taking? Authorizing Provider  levofloxacin  (LEVAQUIN) 750 MG tablet Take 1 tablet (750 mg total) by mouth daily for 28 days. 07/24/20 08/21/20 Yes Margette Fast, MD  meloxicam (MOBIC) 15 MG tablet Take 1 tablet (15 mg total) by mouth daily for 14 days. 07/24/20 08/07/20 Yes Margette Fast, MD  traZODone (DESYREL) 50 MG tablet Take 50 mg by mouth at bedtime. 07/05/20  Yes [provider]  cyclobenzaprine (FLEXERIL) 10 MG tablet Take 1 tablet (10 mg total) by mouth 2 (two) times daily as needed for muscle spasms. Patient not taking: Reported on 07/20/2020 11/15/12   Fayrene Helper, PA-C    Physical Exam: Physical exam was done before patient was taken to the OR. Constitutional: Moderately built and nourished. Vitals:   08/04/20 2330 08/04/20 2343 08/05/20 0004 08/05/20 0347  BP: 120/81 121/73 112/75 114/69  Pulse: (!) 107  (!) 108 93  Resp: (!) 22  18 20   Temp:  100.1 F (37.8 C) 99.8 F (37.7 C) 97.7 F (36.5 C)  TempSrc:   Oral Oral  SpO2: 97%  95% 97%  Weight:      Height:       Eyes: Anicteric no pallor. ENMT: No discharge from the ears eyes nose or mouth. Neck: No mass felt.  No neck rigidity. Respiratory: No rhonchi or crepitations. Cardiovascular: S1-S2 heard. Abdomen: Soft nontender bowel sounds present.  Right scrotum is swollen.  This exam was done before the patient was taken to the OR. Musculoskeletal: No edema. Skin: Right scrotum appears mildly erythematous.  This exam was done before patient was taken to the OR. Neurologic:  Alert awake oriented to time place and person.  Moves all extremities. Psychiatric: Appears normal.  Normal affect.   Labs on Admission: I have personally reviewed following labs and imaging studies  CBC: Recent Labs  Lab 08/04/20 1706  WBC 22.8*  NEUTROABS 18.5*  HGB 12.3*  HCT 35.9*  MCV 78.9*  PLT 452*   Basic Metabolic Panel: Recent Labs  Lab 08/04/20 1706  NA 129*  K 4.1  CL 94*  CO2 26  GLUCOSE 95  BUN 18  CREATININE 1.21  CALCIUM 8.5*   GFR: Estimated  Creatinine Clearance: 96.2 mL/min (by C-G formula based on SCr of 1.21 mg/dL). Liver Function Tests: Recent Labs  Lab 08/04/20 1706  AST 66*  ALT 60*  ALKPHOS 58  BILITOT 0.7  PROT 8.4*  ALBUMIN 2.8*   No results for input(s): LIPASE, AMYLASE in the last 168 hours. No results for input(s): AMMONIA in the last 168 hours. Coagulation Profile: No results for input(s): INR, PROTIME in the last 168 hours. Cardiac Enzymes: No results for input(s): CKTOTAL, CKMB, CKMBINDEX, TROPONINI in the last 168 hours. BNP (last 3 results) No results for input(s): PROBNP in the last 8760 hours. HbA1C: No results for input(s): HGBA1C in the last 72 hours. CBG: No results for input(s): GLUCAP in the last 168 hours. Lipid Profile: No results for input(s): CHOL, HDL, LDLCALC, TRIG, CHOLHDL, LDLDIRECT in the last 72 hours. Thyroid Function Tests: No results for input(s): TSH, T4TOTAL, FREET4, T3FREE, THYROIDAB in the last 72 hours. Anemia Panel: No results for input(s): VITAMINB12, FOLATE, FERRITIN, TIBC, IRON, RETICCTPCT in the last 72 hours. Urine analysis:    Component Value Date/Time   COLORURINE YELLOW 08/04/2020 1604   APPEARANCEUR CLEAR 08/04/2020 1604   LABSPEC >1.046 (H) 08/04/2020 1604   PHURINE 6.0 08/04/2020 1604   GLUCOSEU NEGATIVE 08/04/2020 1604   HGBUR SMALL (A) 08/04/2020 1604   BILIRUBINUR NEGATIVE 08/04/2020 1604   KETONESUR NEGATIVE 08/04/2020 1604   PROTEINUR NEGATIVE 08/04/2020 1604   NITRITE NEGATIVE 08/04/2020 1604   LEUKOCYTESUR MODERATE (A) 08/04/2020 1604   Sepsis Labs: @LABRCNTIP (procalcitonin:4,lacticidven:4) ) Recent Results (from the past 240 hour(s))  Respiratory Panel by RT PCR (Flu A&B, Covid) - Nasopharyngeal Swab     Status: None   Collection Time: 08/04/20  6:53 PM   Specimen: Nasopharyngeal Swab  Result Value Ref Range Status   SARS Coronavirus 2 by RT PCR NEGATIVE NEGATIVE Final    Comment: (NOTE) SARS-CoV-2 target nucleic acids are NOT  DETECTED.  The SARS-CoV-2 RNA is generally detectable in upper respiratoy specimens during the acute phase of infection. The lowest concentration of SARS-CoV-2 viral copies this assay can detect is 131 copies/mL. A negative result does not preclude SARS-Cov-2 infection and should not be used as the sole basis for treatment or other patient management decisions. A negative result may occur with  improper specimen collection/handling, submission of specimen other than nasopharyngeal swab, presence of viral mutation(s) within the areas targeted by this assay, and inadequate number of viral copies (<131 copies/mL). A negative result must be combined with clinical observations, patient history, and epidemiological information. The expected result is Negative.  Fact Sheet for Patients:  https://www.moore.com/https://www.fda.gov/media/142436/download  Fact Sheet for Healthcare Providers:  https://www.young.biz/https://www.fda.gov/media/142435/download  This test is no t yet approved or cleared by the [place] FDA and  has been authorized for detection and/or diagnosis of SARS-CoV-2 by FDA under an Emergency Use Authorization (EUA). This EUA will remain  in effect (meaning this test can be used) for  the duration of the COVID-19 declaration under Section 564(b)(1) of the Act, 21 U.S.C. section 360bbb-3(b)(1), unless the authorization is terminated or revoked sooner.     Influenza A by PCR NEGATIVE NEGATIVE Final   Influenza B by PCR NEGATIVE NEGATIVE Final    Comment: (NOTE) The Xpert Xpress SARS-CoV-2/FLU/RSV assay is intended as an aid in  the diagnosis of influenza from Nasopharyngeal swab specimens and  should not be used as a sole basis for treatment. Nasal washings and  aspirates are unacceptable for Xpert Xpress SARS-CoV-2/FLU/RSV  testing.  Fact Sheet for Patients: https://www.moore.com/  Fact Sheet for Healthcare Providers: https://www.young.biz/  This test is not yet  approved or cleared by the [place] FDA and  has been authorized for detection and/or diagnosis of SARS-CoV-2 by  FDA under an Emergency Use Authorization (EUA). This EUA will remain  in effect (meaning this test can be used) for the duration of the  Covid-19 declaration under Section 564(b)(1) of the Act, 21  U.S.C. section 360bbb-3(b)(1), unless the authorization is  terminated or revoked. Performed at [facility], [address] [place], [place] [address]      Radiological Exams on Admission: CT PELVIS W CONTRAST  Result Date: 08/04/2020 CLINICAL DATA:  Right testicular swelling. EXAM: CT PELVIS WITH CONTRAST TECHNIQUE: Multidetector CT imaging of the pelvis was performed using the standard protocol following the bolus administration of intravenous contrast. CONTRAST:  OMNIPAQUE IOHEXOL 300 MG/ML  SOLN COMPARISON:  July 23, 2020 FINDINGS: Urinary Tract: No abnormality visualized. The Foley catheter seen within the urinary bladder on the prior study has been removed. Bowel:  Unremarkable visualized pelvic bowel loops. Vascular/Lymphatic: No pathologically enlarged lymph nodes. No significant vascular abnormality seen. Reproductive: The small pigtail drainage catheter seen the expected region of the prostate and base of the penis on the prior study has been removed. A large, approximately 3.4 cm x 10.8 cm x 4.6 cm lobulated, complex fluid collection, with surrounding mildly hyperdense wall, is seen involving from the region just below the prostate gland, with inferior extension to include the base of the penis, with subsequent anterior extension into the scrotal wall on the right. This represents a new finding when compared to the prior study. A moderate to marked amount of surrounding soft tissue edema and inflammatory fat stranding is seen. No soft tissue air is identified Other:  None. Musculoskeletal: No acute osseous abnormalities are identified. IMPRESSION: 1.  Interval removal of the small pigtail drainage catheter and Foley catheter seen on the prior study. 2. Large perineal abscess involving the region just below the prostate gland, base of the penis and right scrotal wall, which represents a new finding when compared to the prior study. Electronically Signed   By: Aram Candela M.D.   On: 08/04/2020 18:07   DG C-Arm 1-60 Min-No Report  Result Date: 08/04/2020 Fluoroscopy was utilized by the requesting physician.  No radiographic interpretation.     Assessment/Plan Principal Problem:   Sepsis (HCC) Active Problems:   Perineal abscess    1. Sepsis secondary to perineal abscess for which patient has been started on empiric antibiotics and patient at the time of my exam was about to go to the OR.  Discussed with Dr. Wilson Singer urologist.  Follow cultures and lactic acid. 2. Hyponatremia likely from dehydration.  Presently on IV fluids recheck metabolic panel after hydration.  Since patient has sepsis picture will need inpatient status and close observation.   DVT prophylaxis: SCDs for now start  pharmacological DVT prophylaxis once okay with Dr. Annabell Howells. Code Status: Full code. Family Communication: Discussed with patient. Disposition Plan: Home. Consults called: Urology. Admission status: Inpatient.   Eduard Clos MD [facility] Pager [phone].  If 7PM-7AM, please contact night-coverage [email or web address] Password [email or web address]  08/05/2020, 5:31 AM

## 2020-08-06 LAB — BASIC METABOLIC PANEL
Anion gap: 8 (ref 5–15)
BUN: 17 mg/dL (ref 6–20)
CO2: 26 mmol/L (ref 22–32)
Calcium: 8.1 mg/dL — ABNORMAL LOW (ref 8.9–10.3)
Chloride: 103 mmol/L (ref 98–111)
Creatinine, Ser: 0.79 mg/dL (ref 0.61–1.24)
GFR, Estimated: >60 mL/min (ref >60)
Glucose, Bld: 110 mg/dL — ABNORMAL HIGH (ref 70–99)
Potassium: 3.8 mmol/L (ref 3.5–5.1)
Sodium: 137 mmol/L (ref 135–145)

## 2020-08-06 LAB — CBC
HCT: 31.2 % — ABNORMAL LOW (ref 39.0–52.0)
Hemoglobin: 10.6 g/dL — ABNORMAL LOW (ref 13.0–17.0)
MCH: 27.2 pg (ref 26.0–34.0)
MCHC: 34 g/dL (ref 30.0–36.0)
MCV: 80 fL (ref 80.0–100.0)
Platelets: 429 10*3/uL — ABNORMAL HIGH (ref 150–400)
RBC: 3.9 MIL/uL — ABNORMAL LOW (ref 4.22–5.81)
RDW: 14 % (ref 11.5–15.5)
WBC: 17.2 10*3/uL — ABNORMAL HIGH (ref 4.0–10.5)
nRBC: 0 % (ref 0.0–0.2)

## 2020-08-06 MED ORDER — LIP MEDEX EX OINT
TOPICAL_OINTMENT | CUTANEOUS | Status: AC
  Filled 2020-08-06: qty 7

## 2020-08-06 MED ORDER — LIP MEDEX EX OINT: TOPICAL_OINTMENT | CUTANEOUS | Status: DC | PRN

## 2020-08-06 NOTE — Progress Notes (Signed)
PROGRESS NOTE   Tyler [name]  TGY:[id number]    DOB: 07/20/1986    DOA: 08/04/2020  PCP: Patient, No Pcp Per   I have briefly reviewed patients previous medical records in [facility].  Chief Complaint  Patient presents with  . post op issues    Brief Narrative:  34 year old male with history of recent admission 10/4-10/05/2020 for prostate abscess s/p unsuccessful transurethral unroofing followed by transrectal drain placement with wound cultures growing strep anginosis, drain removed prior to discharge, continued on levofloxacin, has been following up with urology, had his Foley catheter removed on 07/27/2020, presented to the ED due to worsening pain and swelling involving the perineum which moved towards the right scrotum and base of the penis.  Admitted for sepsis due to perineal/right scrotal abscess, s/p I&D of abscess on night of admission.  Improving.   Assessment & Plan:  Principal Problem:   Sepsis ([place]) Active Problems:   Perineal abscess   Sepsis due to perineal/right scrotal abscess, POA:  Met sepsis criteria on admission.  S/p cystourethroscopy and I&D of abscess by urology in OR on 10/19  Follow-up CT pelvis with contrast 10/20: Complete evacuation of abscess noted.  As per urology follow-up, continue Foley catheter for 1 to 2 weeks to allow prostatic urethra to heal  Continue IV vancomycin, cefepime and Flagyl pending culture sensitivity results.  Lactate normal.  Blood cultures x2: Negative to date.  Abscess culture reintubated for better growth.  Gram stain showed abundant gram-negative rods, abundant gram-positive cocci in pairs and chains and clusters, few gram-positive rods.  Acute blood loss anemia  Hemoglobin is dropped from 12.3 preop to 10.9, likely related to surgery and IV fluid dilution.  Stable.  Leukocytosis  Secondary to infectious etiology as above.  Trend daily CBC.  Improving.  Hyponatremia  Presented with sodium of 129.   Possibly a component of dehydration in the context of sepsis.  Improved to 133.  Resolved.  Acute kidney injury:  Creatinine has improved from 1.21 on admission to 0.95.  Resolved.  Body mass index is 24.41 kg/m.   DVT prophylaxis: SCDs Start: 08/04/20 2102     Code Status: Full Code Family Communication: None at bedside. Disposition:  Status is: Inpatient  Remains inpatient appropriate because:Inpatient level of care appropriate due to severity of illness   Dispo: The patient is from: Home              Anticipated d/c is to: Home              Anticipated d/c date is: > 3 days              Patient currently is not medically stable to d/c.        Consultants:   Urology  Procedures:   As noted above  Antimicrobials:    Anti-infectives (From admission, onward)   Start     Dose/Rate Route Frequency Ordered Stop   08/05/20 1100  vancomycin (VANCOREADY) IVPB 1500 mg/300 mL        1,500 mg 150 mL/hr over 120 Minutes Intravenous Every 12 hours 08/05/20 0919     08/05/20 1000  metroNIDAZOLE (FLAGYL) IVPB 500 mg        500 mg 100 mL/hr over 60 Minutes Intravenous Every 8 hours 08/05/20 0908     08/05/20 0600  vancomycin (VANCOCIN) IVPB 1000 mg/200 mL premix  Status:  Discontinued        1,000 mg 200 mL/hr over 60 Minutes Intravenous  Every 8 hours 08/04/20 2029 08/05/20 0321   08/04/20 2200  ceFEPIme (MAXIPIME) 2 g in sodium chloride 0.9 % 100 mL IVPB        2 g 200 mL/hr over 30 Minutes Intravenous Every 8 hours 08/04/20 2104     08/04/20 2030  vancomycin (VANCOREADY) IVPB 1750 mg/350 mL        1,750 mg 175 mL/hr over 120 Minutes Intravenous  Once 08/04/20 2029 08/05/20 0747   08/04/20 1700  cefTRIAXone (ROCEPHIN) 2 g in sodium chloride 0.9 % 100 mL IVPB        2 g 200 mL/hr over 30 Minutes Intravenous  Once 08/04/20 1648 08/04/20 1728        Subjective:  Reports that urology told the abscess drain of a little bit and since then having 8/10 pain this morning.   Denied any other complaints.  Objective:   Vitals:   08/05/20 1608 08/05/20 2219 08/06/20 0523 08/06/20 1213  BP: 116/71 119/75 114/76 125/77  Pulse: 92 85 82 84  Resp: 16 16 16 14   Temp: 98.4 F (36.9 C) 97.6 F (36.4 C) 97.6 F (36.4 C) 98.5 F (36.9 C)  TempSrc: Oral   Oral  SpO2: 98% 98% 100% 99%  Weight:      Height:        General exam: Pleasant young male, moderately built and nourished lying comfortably propped up in bed without distress. Respiratory system: Clear to auscultation. Respiratory effort normal. Cardiovascular system: S1 & S2 heard, RRR. No JVD, murmurs, rubs, gallops or clicks. No pedal edema. Gastrointestinal system: Abdomen is nondistended, soft and nontender. No organomegaly or masses felt. Normal bowel sounds heard. GU: Foley catheter in place draining straw-colored urine.  Scrotal area dressing clean and dry, and did not attempt to remove and examined so as to not cause worsening pain.  Had already been examined by urology earlier. Central nervous system: Alert and oriented. No focal neurological deficits. Extremities: Symmetric 5 x 5 power. Skin: No rashes, lesions or ulcers Psychiatry: Judgement and insight appear normal. Mood & affect appropriate.     Data Reviewed:   I have personally reviewed following labs and imaging studies   CBC: Recent Labs  Lab 08/04/20 1706 08/05/20 0513 08/06/20 0504  WBC 22.8* 25.9* 17.2*  NEUTROABS 18.5*  --   --   HGB 12.3* 10.9* 10.6*  HCT 35.9* 32.0* 31.2*  MCV 78.9* 79.4* 80.0  PLT 452* 403* 429*    Basic Metabolic Panel: Recent Labs  Lab 08/04/20 1706 08/05/20 0513 08/06/20 0504  NA 129* 133* 137  K 4.1 4.4 3.8  CL 94* 97* 103  CO2 26 26 26   GLUCOSE 95 152* 110*  BUN 18 17 17   CREATININE 1.21 0.95 0.79  CALCIUM 8.5* 8.2* 8.1*    Liver Function Tests: Recent Labs  Lab 08/04/20 1706 08/05/20 0513  AST 66* 39  ALT 60* 47*  ALKPHOS 58 55  BILITOT 0.7 0.5  PROT 8.4* 7.5  ALBUMIN 2.8*  2.5*    CBG: No results for input(s): GLUCAP in the last 168 hours.  Microbiology Studies:   Recent Results (from the past 240 hour(s))  Blood culture (routine x 2)     Status: None (Preliminary result)   Collection Time: 08/04/20  4:45 PM   Specimen: BLOOD  Result Value Ref Range Status   Specimen Description   Final    BLOOD RIGHT ANTECUBITAL Performed at [facility] [address] [place],  [address]  Special Requests   Final    BOTTLES DRAWN AEROBIC AND ANAEROBIC Blood Culture adequate volume Performed at [facility] [address] [place], [place] [address]    Culture   Final    NO GROWTH 2 DAYS Performed at [facility] [address], [place], [place] [address]    Report Status PENDING  Incomplete  Blood culture (routine x 2)     Status: None (Preliminary result)   Collection Time: 08/04/20  5:06 PM   Specimen: BLOOD LEFT FOREARM  Result Value Ref Range Status   Specimen Description   Final    BLOOD LEFT FOREARM Performed at [facility] [address] [place], [place] [address]    Special Requests   Final    BOTTLES DRAWN AEROBIC AND ANAEROBIC Blood Culture adequate volume Performed at [facility] [address], [place], [place] [address]    Culture   Final    NO GROWTH 2 DAYS Performed at [facility] [address] [place], [place] [address]    Report Status PENDING  Incomplete  Respiratory Panel by RT PCR (Flu A&B, Covid) - Nasopharyngeal Swab     Status: None   Collection Time: 08/04/20  6:53 PM   Specimen: Nasopharyngeal Swab  Result Value Ref Range Status   SARS Coronavirus 2 by RT PCR NEGATIVE NEGATIVE Final    Comment: (NOTE) SARS-CoV-2 target nucleic acids are NOT DETECTED.  The SARS-CoV-2 RNA is generally detectable in upper respiratoy specimens during the acute phase of infection. The lowest concentration of SARS-CoV-2 viral copies this  assay can detect is 131 copies/mL. A negative result does not preclude SARS-Cov-2 infection and should not be used as the sole basis for treatment or other patient management decisions. A negative result may occur with  improper specimen collection/handling, submission of specimen other than nasopharyngeal swab, presence of viral mutation(s) within the areas targeted by this assay, and inadequate number of viral copies (<131 copies/mL). A negative result must be combined with clinical observations, patient history, and epidemiological information. The expected result is Negative.  Fact Sheet for Patients:  PinkCheek.be  Fact Sheet for Healthcare Providers:  GravelBags.it  This test is no t yet approved or cleared by the [place] FDA and  has been authorized for detection and/or diagnosis of SARS-CoV-2 by FDA under an Emergency Use Authorization (EUA). This EUA will remain  in effect (meaning this test can be used) for the duration of the COVID-19 declaration under Section 564(b)(1) of the Act, 21 U.S.C. section 360bbb-3(b)(1), unless the authorization is terminated or revoked sooner.     Influenza A by PCR NEGATIVE NEGATIVE Final   Influenza B by PCR NEGATIVE NEGATIVE Final    Comment: (NOTE) The Xpert Xpress SARS-CoV-2/FLU/RSV assay is intended as an aid in  the diagnosis of influenza from Nasopharyngeal swab specimens and  should not be used as a sole basis for treatment. Nasal washings and  aspirates are unacceptable for Xpert Xpress SARS-CoV-2/FLU/RSV  testing.  Fact Sheet for Patients: PinkCheek.be  Fact Sheet for Healthcare Providers: GravelBags.it  This test is not yet approved or cleared by the [place] FDA and  has been authorized for detection and/or diagnosis of SARS-CoV-2 by  FDA under an Emergency Use Authorization (EUA). This EUA will  remain  in effect (meaning this test can be used) for the duration of the  Covid-19 declaration under Section 564(b)(1) of the Act, 21  U.S.C. section  360bbb-3(b)(1), unless the authorization is  terminated or revoked. Performed at [facility], [place] [address] [place], [place] [address]   Aerobic/Anaerobic Culture (surgical/deep wound)     Status: None (Preliminary result)   Collection Time: 08/04/20 10:22 PM   Specimen: Wound; Abscess  Result Value Ref Range Status   Specimen Description   Final    WOUND Performed at [facility] [address] [place], [place] [address]    Special Requests   Final    NONE Performed at [facility], [place] [address] [place], [place] [address]    Gram Stain   Final    ABUNDANT WBC PRESENT, PREDOMINANTLY PMN ABUNDANT GRAM NEGATIVE RODS ABUNDANT GRAM POSITIVE COCCI IN PAIRS IN CHAINS IN CLUSTERS FEW GRAM POSITIVE RODS    Culture   Final    CULTURE REINCUBATED FOR BETTER GROWTH Performed at [facility], [place] [address] [place], [place] [address]    Report Status PENDING  Incomplete     Radiology Studies:  CT PELVIS W CONTRAST  Result Date: 08/05/2020 CLINICAL DATA:  Rectal abscess EXAM: CT PELVIS WITH CONTRAST TECHNIQUE: Multidetector CT imaging of the pelvis was performed using the standard protocol following the bolus administration of intravenous contrast. CONTRAST:  159m OMNIPAQUE IOHEXOL 300 MG/ML  SOLN COMPARISON:  08/04/2020 FINDINGS: Urinary Tract: A Foley catheter has been placed with the retaining balloon seen within the bladder lumen. There is subtotal decompression of the bladder lumen. The distal ureters appear decompressed. The previously noted abscess appears to involve the base of the penis, as described below. Bowel: The visualized small and large bowel are unremarkable. There has developed mild free fluid within the pelvis, new from prior examination.  Vascular/Lymphatic: The visualized pelvic vasculature is unremarkable. No pathologic adenopathy is seen within the abdomen and pelvis. Reproductive: Inflammatory changes within the base of the penis efface the borders of the prostate gland. Similar prior examination, the gland demonstrates diffuse hypoenhancement, nonspecific. No discrete intraparenchymal fluid collections are identified. Seminal vesicles are unremarkable. Other: The previously identified loculated fluid collection within the right hemiscrotum extending into the base of the penis has been evacuated. A Penrose drain is seen within the abscess cavity which appears to abut the bulbar urethra as indicated by the indwelling catheter. A small amount of gas is seen within the residual cavity. No residual drainable fluid collection is identified. There is effacement of the fat planes related to interstitial edema. Musculoskeletal: The osseous structures are unremarkable. IMPRESSION: Interval incision and drainage of the peroneal abscess involving the right hemiscrotum and base of the penis with complete evacuation of the drainable fluid component noted on prior examination. Placement of a Penrose drainage catheter within the abscess cavity which abuts the bulbar urethra. Direct communication between the abscess cavity and the bulbar urethra is difficult to exclude on this examination. Persistent diffuse low attenuation of the a prostate gland, nonspecific, without discrete drainable fluid collection identified. Interval development of mild free fluid within the pelvis. Electronically Signed   By: AFidela SalisburyMD   On: 08/05/2020 15:50   CT PELVIS W CONTRAST  Result Date: 08/04/2020 CLINICAL DATA:  Right testicular swelling. EXAM: CT PELVIS WITH CONTRAST TECHNIQUE: Multidetector CT imaging of the pelvis was performed using the standard protocol following the bolus administration of intravenous contrast. CONTRAST:  102mOMNIPAQUE IOHEXOL 300 MG/ML   SOLN COMPARISON:  July 23, 2020 FINDINGS: Urinary Tract: No abnormality visualized. The Foley catheter seen within the urinary  bladder on the prior study has been removed. Bowel:  Unremarkable visualized pelvic bowel loops. Vascular/Lymphatic: No pathologically enlarged lymph nodes. No significant vascular abnormality seen. Reproductive: The small pigtail drainage catheter seen the expected region of the prostate and base of the penis on the prior study has been removed. A large, approximately 3.4 cm x 10.8 cm x 4.6 cm lobulated, complex fluid collection, with surrounding mildly hyperdense wall, is seen involving from the region just below the prostate gland, with inferior extension to include the base of the penis, with subsequent anterior extension into the scrotal wall on the right. This represents a new finding when compared to the prior study. A moderate to marked amount of surrounding soft tissue edema and inflammatory fat stranding is seen. No soft tissue air is identified Other:  None. Musculoskeletal: No acute osseous abnormalities are identified. IMPRESSION: 1. Interval removal of the small pigtail drainage catheter and Foley catheter seen on the prior study. 2. Large perineal abscess involving the region just below the prostate gland, base of the penis and right scrotal wall, which represents a new finding when compared to the prior study. Electronically Signed   By: Virgina Norfolk M.D.   On: 08/04/2020 18:07   DG C-Arm 1-60 Min-No Report  Result Date: 08/04/2020 Fluoroscopy was utilized by the requesting physician.  No radiographic interpretation.     Scheduled Meds:   . Chlorhexidine Gluconate Cloth  6 each Topical Daily  . polyethylene glycol  17 g Oral Daily    Continuous Infusions:   . ceFEPime (MAXIPIME) IV Stopped (08/06/20 1600)  . metronidazole 500 mg (08/06/20 1145)  . vancomycin 1,500 mg (08/06/20 1154)     LOS: 2 days     Vernell Leep, MD, [place], [facility]. [facility]    To contact the attending provider between 7A-7P or the covering provider during after hours 7P-7A, please log into the web site [email or web address] and access using universal [place] password for that web site. If you do not have the password, please call the hospital operator.  08/06/2020, 4:08 PM

## 2020-08-06 NOTE — Progress Notes (Addendum)
2 Days Post-Op Subjective: Repeat CT pelvis yesterday with complete evacuation of peroneal abscess. AFHDS. Improving scrotal pain. Improving leukocytosis. Wound culture pending. Bcx x 2 NGTD. Continued on broad spectrum antibiotics.   Reports chest pain this morning that is reproducible with palpation of anterior chest. Also reports upper abdominal pain and constipation.   Objective: Vital signs in last 24 hours: Temp:  [97.6 F (36.4 C)-98.4 F (36.9 C)] 97.6 F (36.4 C) (10/21 0523) Pulse Rate:  [82-92] 82 (10/21 0523) Resp:  [16-18] 16 (10/21 0523) BP: (113-119)/(71-76) 114/76 (10/21 0523) SpO2:  [98 %-100 %] 100 % (10/21 0523)  Intake/Output from previous day: 10/20 0701 - 10/21 0700 In: 3102.1 [P.O.:940; I.V.:1145; IV Piggyback:1017.2] Out: 4125 [Urine:4125] Intake/Output this shift: Total I/O In: 240 [P.O.:240] Out: -   Physical Exam:  General: Alert and oriented, no distress CV: Regular rate Lungs: NWOB on RA GU: Foley catheter in place draining yellow urine. Mild erythema and tenderness of right scrotum. No fluctuance or crepitus. Penrose drain in place at right superolateral aspect of scrotum without significant purulent drainage on palpation of right scrotum  Ext: NT, No erythema  Lab Results: Recent Labs    08/04/20 1706 08/05/20 0513 08/06/20 0504  HGB 12.3* 10.9* 10.6*  HCT 35.9* 32.0* 31.2*   BMET Recent Labs    08/05/20 0513 08/06/20 0504  NA 133* 137  K 4.4 3.8  CL 97* 103  CO2 26 26  GLUCOSE 152* 110*  BUN 17 17  CREATININE 0.95 0.79  CALCIUM 8.2* 8.1*     Studies/Results: CT PELVIS W CONTRAST  Result Date: 08/05/2020 CLINICAL DATA:  Rectal abscess EXAM: CT PELVIS WITH CONTRAST TECHNIQUE: Multidetector CT imaging of the pelvis was performed using the standard protocol following the bolus administration of intravenous contrast. CONTRAST:  OMNIPAQUE IOHEXOL 300 MG/ML  SOLN COMPARISON:  08/04/2020 FINDINGS: Urinary Tract: A Foley  catheter has been placed with the retaining balloon seen within the bladder lumen. There is subtotal decompression of the bladder lumen. The distal ureters appear decompressed. The previously noted abscess appears to involve the base of the penis, as described below. Bowel: The visualized small and large bowel are unremarkable. There has developed mild free fluid within the pelvis, new from prior examination. Vascular/Lymphatic: The visualized pelvic vasculature is unremarkable. No pathologic adenopathy is seen within the abdomen and pelvis. Reproductive: Inflammatory changes within the base of the penis efface the borders of the prostate gland. Similar prior examination, the gland demonstrates diffuse hypoenhancement, nonspecific. No discrete intraparenchymal fluid collections are identified. Seminal vesicles are unremarkable. Other: The previously identified loculated fluid collection within the right hemiscrotum extending into the base of the penis has been evacuated. A Penrose drain is seen within the abscess cavity which appears to abut the bulbar urethra as indicated by the indwelling catheter. A small amount of gas is seen within the residual cavity. No residual drainable fluid collection is identified. There is effacement of the fat planes related to interstitial edema. Musculoskeletal: The osseous structures are unremarkable. IMPRESSION: Interval incision and drainage of the peroneal abscess involving the right hemiscrotum and base of the penis with complete evacuation of the drainable fluid component noted on prior examination. Placement of a Penrose drainage catheter within the abscess cavity which abuts the bulbar urethra. Direct communication between the abscess cavity and the bulbar urethra is difficult to exclude on this examination. Persistent diffuse low attenuation of the a prostate gland, nonspecific, without discrete drainable fluid collection identified. Interval development of mild  free fluid  within the pelvis. Electronically Signed   By: Helyn Numbers MD   On: 08/05/2020 15:50   CT PELVIS W CONTRAST  Result Date: 08/04/2020 CLINICAL DATA:  Right testicular swelling. EXAM: CT PELVIS WITH CONTRAST TECHNIQUE: Multidetector CT imaging of the pelvis was performed using the standard protocol following the bolus administration of intravenous contrast. CONTRAST:  OMNIPAQUE IOHEXOL 300 MG/ML  SOLN COMPARISON:  July 23, 2020 FINDINGS: Urinary Tract: No abnormality visualized. The Foley catheter seen within the urinary bladder on the prior study has been removed. Bowel:  Unremarkable visualized pelvic bowel loops. Vascular/Lymphatic: No pathologically enlarged lymph nodes. No significant vascular abnormality seen. Reproductive: The small pigtail drainage catheter seen the expected region of the prostate and base of the penis on the prior study has been removed. A large, approximately 3.4 cm x 10.8 cm x 4.6 cm lobulated, complex fluid collection, with surrounding mildly hyperdense wall, is seen involving from the region just below the prostate gland, with inferior extension to include the base of the penis, with subsequent anterior extension into the scrotal wall on the right. This represents a new finding when compared to the prior study. A moderate to marked amount of surrounding soft tissue edema and inflammatory fat stranding is seen. No soft tissue air is identified Other:  None. Musculoskeletal: No acute osseous abnormalities are identified. IMPRESSION: 1. Interval removal of the small pigtail drainage catheter and Foley catheter seen on the prior study. 2. Large perineal abscess involving the region just below the prostate gland, base of the penis and right scrotal wall, which represents a new finding when compared to the prior study. Electronically Signed   By: Aram Candela M.D.   On: 08/04/2020 18:07   DG C-Arm 1-60 Min-No Report  Result Date: 08/04/2020 Fluoroscopy was utilized by  the requesting physician.  No radiographic interpretation.    Assessment/Plan: 34yo male with recent hospitalization 10/4-10/8/21 for prostate abscess s/p unsuccessful transurethral unroofing followed by transrectal drain placement with wound cultures growing strep anginosis. Drain was removed prior to hospital discharge, and patient was continued on Levaquin for 4 weeks. Unfortunately, he re-presented to the ED on 10/19 with imaging demonstrating recurrent periurethral abscess with perineal and scrotal involvement. He is now s/p RUG (showed persistent injury of right lateral prostate capsule unavoidably made during initial cystoscopic procedure), cystourethroscopy, and I&D of perineal abscess with placement of penrose drain.  He is clinically improving after I&D and on broad spectrum antibiotics with vanc/cefepime. Interval CT Pelvis 10/20 with complete evacuation of peroneal abscess.   - Continue foley catheter for ~2 weeks to allow prostatic urethra to heal - Continue broad spectrum antibiotics. Follow up cultures, and narrow/adjust antibiotics accordingly   LOS: 2 days   Margette Fast 08/06/2020, 10:40 AM

## 2020-08-07 LAB — BASIC METABOLIC PANEL
Anion gap: 7 (ref 5–15)
BUN: 11 mg/dL (ref 6–20)
CO2: 27 mmol/L (ref 22–32)
Calcium: 8.9 mg/dL (ref 8.9–10.3)
Chloride: 102 mmol/L (ref 98–111)
Creatinine, Ser: 0.83 mg/dL (ref 0.61–1.24)
GFR, Estimated: >60 mL/min (ref >60)
Glucose, Bld: 95 mg/dL (ref 70–99)
Potassium: 4.6 mmol/L (ref 3.5–5.1)
Sodium: 136 mmol/L (ref 135–145)

## 2020-08-07 LAB — CBC
HCT: 34.2 % — ABNORMAL LOW (ref 39.0–52.0)
Hemoglobin: 11.3 g/dL — ABNORMAL LOW (ref 13.0–17.0)
MCH: 26.8 pg (ref 26.0–34.0)
MCHC: 33 g/dL (ref 30.0–36.0)
MCV: 81 fL (ref 80.0–100.0)
Platelets: 433 10*3/uL — ABNORMAL HIGH (ref 150–400)
RBC: 4.22 MIL/uL (ref 4.22–5.81)
RDW: 14.4 % (ref 11.5–15.5)
WBC: 9.3 10*3/uL (ref 4.0–10.5)
nRBC: 0 % (ref 0.0–0.2)

## 2020-08-07 MED ORDER — BISACODYL 5 MG PO TBEC
10.0000 mg | DELAYED_RELEASE_TABLET | Freq: Once | ORAL | Status: AC
  Administered 2020-08-07: 10 mg via ORAL
  Filled 2020-08-07 (×2): qty 2

## 2020-08-07 MED ORDER — SENNA 8.6 MG PO TABS
2.0000 | ORAL_TABLET | Freq: Every day | ORAL | Status: DC
  Administered 2020-08-07: 17.2 mg via ORAL
  Filled 2020-08-07 (×2): qty 2

## 2020-08-07 MED ORDER — POLYETHYLENE GLYCOL 3350 17 G PO PACK
17.0000 g | PACK | Freq: Two times a day (BID) | ORAL | Status: DC
  Administered 2020-08-07 – 2020-08-10 (×6): 17 g via ORAL
  Filled 2020-08-07 (×6): qty 1

## 2020-08-07 MED ORDER — OXYCODONE HCL 5 MG PO TABS
5.0000 mg | ORAL_TABLET | ORAL | Status: DC | PRN
  Administered 2020-08-07 – 2020-08-08 (×4): 10 mg via ORAL
  Filled 2020-08-07 (×4): qty 2

## 2020-08-07 NOTE — Progress Notes (Signed)
Pharmacy Antibiotic Note  Tyler [name] is a 34 y.o. male admitted on 08/04/2020 with Periurethral abscess.  Pharmacy has been consulted for vancomycin and cefepime dosing.  Pt is 49 yoM with recent hospitalization for pelvic abscess requiring drainage and Foley placement for necrotic urethra. Culture previously + for strep anginosis and prescribed levofloxacin on discharge. Foley removed 07/27/20. Pt presented back to ED on 10/19 with scrotal pain. Taken to OR for I&D on 10/19.  Significant Events: -10/19: I&D  Today, 08/07/20  WBC now WNL  SCr 0.83, CrCl > 80 mL/min  Afebrile  This is day #4 of IV antibiotics.   Plan:  Continue cefepime 2 g IV q8h  Continue vancomycin 1500 mg IV q12h for goal VT 10-20 mcg/mL and/or AUC 400-550  Metronidazole 500 mg IV q8h  Follow renal function and culture data for antibiotic de-escalation  Check VT at steady state as needed  Height: 6' (182.9 cm) Weight: 81.6 kg (180 lb) IBW/kg (Calculated) : 77.6  Temp (24hrs), Avg:98.1 F (36.7 C), Min:97.9 F (36.6 C), Max:98.3 F (36.8 C)  Recent Labs  Lab 08/04/20 1706 08/04/20 2000 08/05/20 0513 08/06/20 0504 08/07/20 0442  WBC 22.8*  --  25.9* 17.2* 9.3  CREATININE 1.21  --  0.95 0.79 0.83  LATICACIDVEN 1.7 1.2  --   --   --     Estimated Creatinine Clearance: 140.2 mL/min (by C-G formula based on SCr of 0.83 mg/dL).    No Known Allergies  Antimicrobials this admission: vancomycin 10/19 >>  cefepime 10/19 >>  Metronidazole 10/20 >>  Dose adjustments this admission:  Microbiology results: 10/19 BCx: ngtd 10/19 Wound: Abundant GNR, GPC in pairs/chains AND clusters  Thank you for allowing pharmacy to be a part of this patient's care.  Cindi Carbon, PharmD 08/07/2020 2:01 PM

## 2020-08-07 NOTE — Progress Notes (Signed)
PROGRESS NOTE   Tyler [name]  ULA:[id number]    DOB: January 28, 1986    DOA: 08/04/2020  PCP: Patient, No Pcp Per   I have briefly reviewed patients previous medical [facility] in [facility].  Chief Complaint  Patient presents with  . post op issues    Brief Narrative:  34 year old male with history of recent admission 10/4-10/05/2020 for prostate abscess s/p unsuccessful transurethral unroofing followed by transrectal drain placement with wound cultures growing strep anginosis, drain removed prior to discharge, continued on levofloxacin, has been following up with urology, had his Foley catheter removed on 07/27/2020, presented to the ED due to worsening pain and swelling involving the perineum which moved towards the right scrotum and base of the penis.  Admitted for sepsis due to perineal/right scrotal abscess, s/p I&D of abscess on night of admission.  Improving.   Assessment & Plan:  Principal Problem:   Sepsis ([place]) Active Problems:   Perineal abscess   Sepsis due to perineal/right scrotal abscess, POA:  Met sepsis criteria on admission.  S/p cystourethroscopy and I&D of abscess by urology in OR on 10/19  Follow-up CT pelvis with contrast 10/20: Complete evacuation of abscess noted.  As per urology follow-up, continue Foley catheter for 1 to 2 weeks to allow prostatic urethra to heal  Continue IV vancomycin, cefepime and Flagyl pending culture sensitivity results.  Lactate normal.  Blood cultures x2: Negative to date.  Abscess culture reintubated for better growth.  Gram stain showed abundant gram-negative rods, abundant gram-positive cocci in pairs and chains and clusters, few gram-positive rods.  Slowly improving. Adjusted pain control regimen d/t sub optimal pain control. Added aggressive bowel regimen for constipation. Encouraged mobilization.  Acute blood loss anemia  Hemoglobin is dropped from 12.3 preop to 10.9, likely related to surgery and IV fluid dilution.   Stable.  Leukocytosis  Secondary to infectious etiology as above.  Resolved  Hyponatremia  Presented with sodium of 129.  Possibly a component of dehydration in the context of sepsis.  Improved to 133.  Resolved.  Acute kidney injury:  Creatinine has improved from 1.21 on admission to 0.95.  Resolved.  Body mass index is 24.41 kg/m.   DVT prophylaxis: SCDs Start: 08/04/20 2102     Code Status: Full Code Family Communication: None at bedside. Disposition:  Status is: Inpatient  Remains inpatient appropriate because:Inpatient level of care appropriate due to severity of illness   Dispo: The patient is from: Home              Anticipated d/c is to: Home              Anticipated d/c date is: > 3 days              Patient currently is not medically stable to d/c.        Consultants:   Urology  Procedures:   As noted above  Antimicrobials:    Anti-infectives (From admission, onward)   Start     Dose/Rate Route Frequency Ordered Stop   08/05/20 1100  vancomycin (VANCOREADY) IVPB 1500 mg/300 mL        1,500 mg 150 mL/hr over 120 Minutes Intravenous Every 12 hours 08/05/20 0919     08/05/20 1000  metroNIDAZOLE (FLAGYL) IVPB 500 mg        500 mg 100 mL/hr over 60 Minutes Intravenous Every 8 hours 08/05/20 0908     08/05/20 0600  vancomycin (VANCOCIN) IVPB 1000 mg/200 mL premix  Status:  Discontinued        1,000 mg 200 mL/hr over 60 Minutes Intravenous Every 8 hours 08/04/20 2029 08/05/20 0321   08/04/20 2200  ceFEPIme (MAXIPIME) 2 g in sodium chloride 0.9 % 100 mL IVPB        2 g 200 mL/hr over 30 Minutes Intravenous Every 8 hours 08/04/20 2104     08/04/20 2030  vancomycin (VANCOREADY) IVPB 1750 mg/350 mL        1,750 mg 175 mL/hr over 120 Minutes Intravenous  Once 08/04/20 2029 08/05/20 0747   08/04/20 1700  cefTRIAXone (ROCEPHIN) 2 g in sodium chloride 0.9 % 100 mL IVPB        2 g 200 mL/hr over 30 Minutes Intravenous  Once 08/04/20 1648 08/04/20 1728         Subjective:  States pain not controlled, 6/10 this am. Wants meds changed. No BM for couple of days. No abd pain. Scrotal pain.  Objective:   Vitals:   08/06/20 2131 08/07/20 0540 08/07/20 0800 08/07/20 1313  BP: 114/67 120/68  123/74  Pulse: 79 62  87  Resp: _0 (!) 24  Temp: 98.3 F (36.8 C) 98 F (36.7 C)  97.9 F (36.6 C)  TempSrc: Oral Oral  Oral  SpO2: 99% 99%  100%  Weight:      Height:        General exam: Pleasant young male, moderately built and nourished lying comfortably propped up in bed without distress. Respiratory system: Clear to auscultation. Respiratory effort normal. Cardiovascular system: S1 & S2 heard, RRR. No JVD, murmurs, rubs, gallops or clicks. No pedal edema. Gastrointestinal system: Abdomen is nondistended, soft and nontender. No organomegaly or masses felt. Normal bowel sounds heard. GU: Foley catheter in place draining straw-colored urine.  Scrotal area dressing clean and dry. Removed dressing, drain in place with minimal soiling of overlying dressing.  Central nervous system: Alert and oriented. No focal neurological deficits. Extremities: Symmetric 5 x 5 power. Skin: No rashes, lesions or ulcers Psychiatry: Judgement and insight appear normal. Mood & affect appropriate.     Data Reviewed:   I have personally reviewed following labs and imaging studies   CBC: Recent Labs  Lab 08/04/20 1706 08/04/20 1706 08/05/20 0513 08/06/20 0504 08/07/20 0442  WBC 22.8*   < > 25.9* 17.2* 9.3  NEUTROABS 18.5*  --   --   --   --   HGB 12.3*   < > 10.9* 10.6* 11.3*  HCT 35.9*   < > 32.0* 31.2* 34.2*  MCV 78.9*   < > 79.4* 80.0 81.0  PLT 452*   < > 403* 429* 433*   < > = values in this interval not displayed.    Basic Metabolic Panel: Recent Labs  Lab 08/05/20 0513 08/06/20 0504 08/07/20 0442  NA 133* 137 136  K 4.4 3.8 4.6  CL 97* 103 102  CO2 _1 GLUCOSE 152* 110* 95  BUN _2 CREATININE 0.95 0.79 0.83  CALCIUM  8.2* 8.1* 8.9    Liver Function Tests: Recent Labs  Lab 08/04/20 1706 08/05/20 0513  AST 66* 39  ALT 60* 47*  ALKPHOS 58 55  BILITOT 0.7 0.5  PROT 8.4* 7.5  ALBUMIN 2.8* 2.5*    CBG: No results for input(s): GLUCAP in the last 168 hours.  Microbiology Studies:   Recent Results (from the past 240 hour(s))  Blood culture (routine x 2)     Status: None (  Preliminary result)   Collection Time: 08/04/20  4:45 PM   Specimen: BLOOD  Result Value Ref Range Status   Specimen Description   Final    BLOOD RIGHT ANTECUBITAL Performed at [facility] [address], [place], [place] [address]    Special Requests   Final    BOTTLES DRAWN AEROBIC AND ANAEROBIC Blood Culture adequate volume Performed at [facility] [address], [place], [place] [address]    Culture   Final    NO GROWTH 3 DAYS Performed at [facility], [place] [address], [place], [place] [address]    Report Status PENDING  Incomplete  Blood culture (routine x 2)     Status: None (Preliminary result)   Collection Time: 08/04/20  5:06 PM   Specimen: BLOOD LEFT FOREARM  Result Value Ref Range Status   Specimen Description   Final    BLOOD LEFT FOREARM Performed at [facility] [address], [place], [place] [address]    Special Requests   Final    BOTTLES DRAWN AEROBIC AND ANAEROBIC Blood Culture adequate volume Performed at [facility] [address], [place], [place] [address]    Culture   Final    NO GROWTH 3 DAYS Performed at [facility], [place] [address] [place],  [address]    Report Status PENDING  Incomplete  Respiratory Panel by RT PCR (Flu A&B, Covid) - Nasopharyngeal Swab     Status: None   Collection Time: 08/04/20  6:53 PM   Specimen: Nasopharyngeal Swab  Result Value Ref Range Status   SARS Coronavirus 2 by RT PCR NEGATIVE NEGATIVE Final    Comment: (NOTE) SARS-CoV-2 target nucleic acids are  NOT DETECTED.  The SARS-CoV-2 RNA is generally detectable in upper respiratoy specimens during the acute phase of infection. The lowest concentration of SARS-CoV-2 viral copies this assay can detect is 131 copies/mL. A negative result does not preclude SARS-Cov-2 infection and should not be used as the sole basis for treatment or other patient management decisions. A negative result may occur with  improper specimen collection/handling, submission of specimen other than nasopharyngeal swab, presence of viral mutation(s) within the areas targeted by this assay, and inadequate number of viral copies (<131 copies/mL). A negative result must be combined with clinical observations, patient history, and epidemiological information. The expected result is Negative.  Fact Sheet for Patients:  PinkCheek.be  Fact Sheet for Healthcare Providers:  GravelBags.it  This test is no t yet approved or cleared by the [place] FDA and  has been authorized for detection and/or diagnosis of SARS-CoV-2 by FDA under an Emergency Use Authorization (EUA). This EUA will remain  in effect (meaning this test can be used) for the duration of the COVID-19 declaration under Section 564(b)(1) of the Act, 21 U.S.C. section 360bbb-3(b)(1), unless the authorization is terminated or revoked sooner.     Influenza A by PCR NEGATIVE NEGATIVE Final   Influenza B by PCR NEGATIVE NEGATIVE Final    Comment: (NOTE) The Xpert Xpress SARS-CoV-2/FLU/RSV assay is intended as an aid in  the diagnosis of influenza from Nasopharyngeal swab specimens and  should not be used as a sole basis for treatment. Nasal washings and  aspirates are unacceptable for Xpert Xpress SARS-CoV-2/FLU/RSV  testing.  Fact Sheet for Patients: PinkCheek.be  Fact Sheet for Healthcare Providers: GravelBags.it  This test is not yet  approved or cleared by the [place] and  has been authorized for detection and/or diagnosis of SARS-CoV-2 by  FDA under an Emergency Use Authorization (EUA). This EUA will remain  in effect (meaning this test can be used) for the duration of the  Covid-19 declaration under Section 564(b)(1) of the Act, 21  U.S.C. section 360bbb-3(b)(1), unless the authorization is  terminated or revoked. Performed at [facility], [place] [address] [place], [place] [address]   Aerobic/Anaerobic Culture (surgical/deep wound)     Status: None (Preliminary result)   Collection Time: 08/04/20 10:22 PM   Specimen: Wound; Abscess  Result Value Ref Range Status   Specimen Description   Final    WOUND Performed at [facility] [address] [place], [place] [address]    Special Requests   Final    NONE Performed at [facility], [place] [address] [place], [place] [address]    Gram Stain   Final    ABUNDANT WBC PRESENT, PREDOMINANTLY PMN ABUNDANT GRAM NEGATIVE RODS ABUNDANT GRAM POSITIVE COCCI IN PAIRS IN CHAINS IN CLUSTERS FEW GRAM POSITIVE RODS    Culture   Final    CULTURE REINCUBATED FOR BETTER GROWTH Performed at [facility], [place] [address], [place], [place] [address]    Report Status PENDING  Incomplete     Radiology Studies:  No results found.   Scheduled Meds:   . Chlorhexidine Gluconate Cloth  6 each Topical Daily  . polyethylene glycol  17 g Oral BID  . senna  2 tablet Oral Daily    Continuous Infusions:   . ceFEPime (MAXIPIME) IV 2 g (08/07/20 1353)  . metronidazole 500 mg (08/07/20 1104)  . vancomycin 1,500 mg (08/07/20 1103)     LOS: 3 days     Vernell Leep, MD, [place], [facility]. [facility]    To contact the attending provider between 7A-7P or the covering provider during after hours 7P-7A, please log into the web site [email or web address] and access using universal [place] password for that web  site. If you do not have the password, please call the hospital operator.  08/07/2020, 5:36 PM

## 2020-08-07 NOTE — Progress Notes (Signed)
3 Days Post-Op Subjective: AFHDS. Improving scrotal pain. Resolved leukocytosis. Wound culture pending. Bcx x 2 NGTD. Continued on broad spectrum antibiotics.   Has ambulated minimally during hospitalization.   Objective: Vital signs in last 24 hours: Temp:  [98 F (36.7 C)-98.5 F (36.9 C)] 98 F (36.7 C) (10/22 0540) Pulse Rate:  [62-84] 62 (10/22 0540) Resp:  [14-16] 16 (10/22 0800) BP: (114-125)/(67-77) 120/68 (10/22 0540) SpO2:  [99 %] 99 % (10/22 0540)  Intake/Output from previous day: 10/21 0701 - 10/22 0700 In: 1997 [P.O.:480; I.V.:433.9; IV Piggyback:1083.1] Out: 4200 [Urine:4200] Intake/Output this shift: No intake/output data recorded.  Physical Exam:  General: Alert and oriented, no distress CV: Regular rate Lungs: NWOB on RA GU: Foley catheter in place draining yellow urine. Mild erythema and tenderness of right scrotum. No fluctuance or crepitus. Penrose drain in place at right superolateral aspect of scrotum without significant purulent drainage on palpation of right scrotum  Ext: NT, No erythema  Lab Results: Recent Labs    08/05/20 0513 08/06/20 0504 08/07/20 0442  HGB 10.9* 10.6* 11.3*  HCT 32.0* 31.2* 34.2*   BMET Recent Labs    08/06/20 0504 08/07/20 0442  NA 137 136  K 3.8 4.6  CL 103 102  CO2 26 27  GLUCOSE 110* 95  BUN 17 11  CREATININE 0.79 0.83  CALCIUM 8.1* 8.9     Studies/Results: CT PELVIS W CONTRAST  Result Date: 08/05/2020 CLINICAL DATA:  Rectal abscess EXAM: CT PELVIS WITH CONTRAST TECHNIQUE: Multidetector CT imaging of the pelvis was performed using the standard protocol following the bolus administration of intravenous contrast. CONTRAST:  OMNIPAQUE IOHEXOL 300 MG/ML  SOLN COMPARISON:  08/04/2020 FINDINGS: Urinary Tract: A Foley catheter has been placed with the retaining balloon seen within the bladder lumen. There is subtotal decompression of the bladder lumen. The distal ureters appear decompressed. The previously  noted abscess appears to involve the base of the penis, as described below. Bowel: The visualized small and large bowel are unremarkable. There has developed mild free fluid within the pelvis, new from prior examination. Vascular/Lymphatic: The visualized pelvic vasculature is unremarkable. No pathologic adenopathy is seen within the abdomen and pelvis. Reproductive: Inflammatory changes within the base of the penis efface the borders of the prostate gland. Similar prior examination, the gland demonstrates diffuse hypoenhancement, nonspecific. No discrete intraparenchymal fluid collections are identified. Seminal vesicles are unremarkable. Other: The previously identified loculated fluid collection within the right hemiscrotum extending into the base of the penis has been evacuated. A Penrose drain is seen within the abscess cavity which appears to abut the bulbar urethra as indicated by the indwelling catheter. A small amount of gas is seen within the residual cavity. No residual drainable fluid collection is identified. There is effacement of the fat planes related to interstitial edema. Musculoskeletal: The osseous structures are unremarkable. IMPRESSION: Interval incision and drainage of the peroneal abscess involving the right hemiscrotum and base of the penis with complete evacuation of the drainable fluid component noted on prior examination. Placement of a Penrose drainage catheter within the abscess cavity which abuts the bulbar urethra. Direct communication between the abscess cavity and the bulbar urethra is difficult to exclude on this examination. Persistent diffuse low attenuation of the a prostate gland, nonspecific, without discrete drainable fluid collection identified. Interval development of mild free fluid within the pelvis. Electronically Signed   By: Helyn Numbers MD   On: 08/05/2020 15:50    Assessment/Plan: 34yo male with recent hospitalization 10/4-10/8/21 for  prostate abscess s/p  unsuccessful transurethral unroofing followed by transrectal drain placement with wound cultures growing strep anginosis. Drain was removed prior to hospital discharge, and patient was continued on Levaquin for 4 weeks. Unfortunately, he re-presented to the ED on 10/19 with imaging demonstrating recurrent periurethral abscess with perineal and scrotal involvement. He is now s/p RUG (showed persistent injury of right lateral prostate capsule unavoidably made during initial cystoscopic procedure), cystourethroscopy, and I&D of perineal abscess with placement of penrose drain.  He is clinically improving after I&D and on broad spectrum antibiotics with vanc/cefepime/flagyl. Interval CT Pelvis 10/20 with complete evacuation of peroneal abscess. Cultures pending.  - Continue foley catheter for ~2 weeks to allow prostatic urethra to heal - Continue broad spectrum antibiotics. Follow up cultures, and narrow/adjust antibiotics accordingly - Encourage ambulation    LOS: 3 days   Margette Fast 08/07/2020, 11:17 AM

## 2020-08-08 LAB — BASIC METABOLIC PANEL
Anion gap: 9 (ref 5–15)
BUN: 13 mg/dL (ref 6–20)
CO2: 26 mmol/L (ref 22–32)
Calcium: 8.5 mg/dL — ABNORMAL LOW (ref 8.9–10.3)
Chloride: 102 mmol/L (ref 98–111)
Creatinine, Ser: 0.77 mg/dL (ref 0.61–1.24)
GFR, Estimated: >60 mL/min (ref >60)
Glucose, Bld: 94 mg/dL (ref 70–99)
Potassium: 4.1 mmol/L (ref 3.5–5.1)
Sodium: 137 mmol/L (ref 135–145)

## 2020-08-08 LAB — CBC
HCT: 33.5 % — ABNORMAL LOW (ref 39.0–52.0)
Hemoglobin: 11.1 g/dL — ABNORMAL LOW (ref 13.0–17.0)
MCH: 26.6 pg (ref 26.0–34.0)
MCHC: 33.1 g/dL (ref 30.0–36.0)
MCV: 80.3 fL (ref 80.0–100.0)
Platelets: 477 10*3/uL — ABNORMAL HIGH (ref 150–400)
RBC: 4.17 MIL/uL — ABNORMAL LOW (ref 4.22–5.81)
RDW: 14.4 % (ref 11.5–15.5)
WBC: 8.9 10*3/uL (ref 4.0–10.5)
nRBC: 0 % (ref 0.0–0.2)

## 2020-08-08 MED ORDER — DOCUSATE SODIUM 100 MG PO CAPS
100.0000 mg | ORAL_CAPSULE | Freq: Two times a day (BID) | ORAL | Status: DC
  Administered 2020-08-08 – 2020-08-10 (×4): 100 mg via ORAL
  Filled 2020-08-08 (×4): qty 1

## 2020-08-08 MED ORDER — SENNA 8.6 MG PO TABS
2.0000 | ORAL_TABLET | Freq: Two times a day (BID) | ORAL | Status: DC
  Administered 2020-08-08 – 2020-08-10 (×4): 17.2 mg via ORAL
  Filled 2020-08-08 (×4): qty 2

## 2020-08-08 MED ORDER — MAGNESIUM HYDROXIDE 400 MG/5ML PO SUSP: 30.0000 mL | Freq: Every day | ORAL | Status: DC | PRN

## 2020-08-08 MED ORDER — HYDROMORPHONE HCL 1 MG/ML IJ SOLN
0.5000 mg | INTRAMUSCULAR | Status: DC | PRN
  Administered 2020-08-08: 0.5 mg via INTRAVENOUS
  Filled 2020-08-08: qty 1

## 2020-08-08 MED ORDER — OXYCODONE HCL 5 MG PO TABS
10.0000 mg | ORAL_TABLET | ORAL | Status: DC | PRN
  Administered 2020-08-08 – 2020-08-10 (×5): 10 mg via ORAL
  Filled 2020-08-08 (×5): qty 2

## 2020-08-08 NOTE — Progress Notes (Signed)
4 Days Post-Op Subjective: No acute events overnight. Complaining of pain with Penrose drain removal. Still has not had a bowel movement yet. Not ambulating much. Leukocytosis resolved. Afebrile over the past 24 hours.  Objective: Vital signs in last 24 hours: Temp:  [97.7 F (36.5 C)-98.4 F (36.9 C)] 97.7 F (36.5 C) (10/23 0618) Pulse Rate:  [84-87] 84 (10/23 0618) Resp:  [18-24] 18 (10/23 0618) BP: (114-123)/(72-80) 120/80 (10/23 0618) SpO2:  [98 %-100 %] 100 % (10/23 0618)  Intake/Output from previous day: 10/22 0701 - 10/23 0700 In: 720 [P.O.:720] Out: 3300 [Urine:3300]  Intake/Output this shift: Total I/O In: 480 [P.O.:480] Out: 925 [Urine:925]  Physical Exam:  General: Alert and oriented CV: RRR, palpable distal pulses Lungs: CTAB, equal chest rise Abdomen: Soft, NTND, no rebound or guarding Incisions: Right hemiscrotal incision shows no evidence of erythema or crepitus. The scrotal skin in general shows no evidence of erythema, crepitus or necrosis. Penrose drain in place in right hemiscrotal wound with scant output. Dressings clean. Gu: Foley catheter in place and draining clear-yellow urine Ext: NT, No erythema  Lab Results: Recent Labs    08/06/20 0504 08/07/20 0442 08/08/20 0545  HGB 10.6* 11.3* 11.1*  HCT 31.2* 34.2* 33.5*   BMET Recent Labs    08/07/20 0442 08/08/20 0545  NA 136 137  K 4.6 4.1  CL 102 102  CO2 27 26  GLUCOSE 95 94  BUN 11 13  CREATININE 0.83 0.77  CALCIUM 8.9 8.5*     Studies/Results: No results found.  Assessment/Plan: 34yo male with recent hospitalization 10/4-10/8/21 for prostate abscess s/p unsuccessful transurethral unroofing followed by transrectal drain placement with wound cultures growing strep anginosis. Drain was removed prior to hospital discharge, and patient was continued on Levaquin for 4 weeks. Unfortunately, he re-presented to the ED on 10/19 with imaging demonstrating recurrent periurethral abscess with  perineal and scrotal involvement. He is now s/p RUG (showed persistent injury of right lateral prostate capsule unavoidably made during initial cystoscopic procedure), cystourethroscopy, and I&D of perineal abscess with placement of penrose drain.  -Wound cultures are being re-incubated. Continue broad-spectrum antibiotics until sensitivities return. -Continue to remove 3 to 4 inches of Penrose drain daily. Premedicate with Dilaudid prior to drain manipulation -Keep Foley catheter in for a total of 2 weeks -Will continue to monitor   LOS: 4 days   Rhoderick Moody, MD [facility] Pager: [phone]  08/08/2020, 10:09 AM

## 2020-08-08 NOTE — Progress Notes (Addendum)
3 inches of penrose drain removed per order. Pt tolerated well. Pt was pre-medicated with 0.5mg  of dilaudid before. Pt rated pain at a 4/10 after section of drain removed. Will continue to monitor closely.

## 2020-08-08 NOTE — Plan of Care (Signed)
  Problem: Education: Goal: Knowledge of General Education information will improve Description Including pain rating scale, medication(s)/side effects and non-pharmacologic comfort measures Outcome: Progressing   Problem: Health Behavior/Discharge Planning: Goal: Ability to manage health-related needs will improve Outcome: Progressing   Problem: Clinical Measurements: Goal: Ability to maintain clinical measurements within normal limits will improve Outcome: Progressing Goal: Will remain free from infection Outcome: Progressing Goal: Diagnostic test results will improve Outcome: Progressing Goal: Respiratory complications will improve Outcome: Progressing Goal: Cardiovascular complication will be avoided Outcome: Progressing   Problem: Activity: Goal: Risk for activity intolerance will decrease Outcome: Progressing   Problem: Nutrition: Goal: Adequate nutrition will be maintained Outcome: Progressing   Problem: Coping: Goal: Level of anxiety will decrease Outcome: Progressing   Problem: Elimination: Goal: Will not experience complications related to bowel motility Outcome: Progressing Goal: Will not experience complications related to urinary retention Outcome: Progressing   Problem: Pain Managment: Goal: General experience of comfort will improve Outcome: Progressing   Problem: Safety: Goal: Ability to remain free from injury will improve Outcome: Progressing   Problem: Skin Integrity: Goal: Risk for impaired skin integrity will decrease Outcome: Progressing   Problem: Clinical Measurements: Goal: Postoperative complications will be avoided or minimized Outcome: Progressing   Problem: Skin Integrity: Goal: Demonstration of wound healing without infection will improve Outcome: Progressing   

## 2020-08-08 NOTE — Progress Notes (Signed)
Pt was able to pivot to [facility] have a medium size BM. Pt states that he feels much better after having a BM. Right scrotal dressing changed at this time.

## 2020-08-08 NOTE — Progress Notes (Signed)
PROGRESS NOTE   Tyler [name]  YBO:[id number]    DOB: 1986/05/19    DOA: 08/04/2020  PCP: Patient, No Pcp Per   I have briefly reviewed patients previous medical records in [facility].  Chief Complaint  Patient presents with  . post op issues    Brief Narrative:  34 year old male with history of recent admission 10/4-10/05/2020 for prostate abscess s/p unsuccessful transurethral unroofing followed by transrectal drain placement with wound cultures growing strep anginosis, drain removed prior to discharge, continued on levofloxacin, has been following up with urology, had his Foley catheter removed on 07/27/2020, presented to the ED due to worsening pain and swelling involving the perineum which moved towards the right scrotum and base of the penis.  Admitted for sepsis due to perineal/right scrotal abscess, s/p I&D of abscess on night of admission.  Improving.   Assessment & Plan:  Principal Problem:   Sepsis ([place]) Active Problems:   Perineal abscess   Sepsis due to perineal/right scrotal abscess, POA:  Met sepsis criteria on admission.  S/p cystourethroscopy and I&D of abscess by urology in OR on 10/19  Follow-up CT pelvis with contrast 10/20: Complete evacuation of abscess noted.  As per urology follow-up, continue Foley catheter for 1 to 2 weeks to allow prostatic urethra to heal  Continue IV vancomycin, cefepime and Flagyl pending culture sensitivity results.  Lactate normal.  Blood cultures x2: Negative to date.  Abscess culture reintubated for better growth.  Gram stain showed abundant gram-negative rods, abundant gram-positive cocci in pairs and chains and clusters, few gram-positive rods.  Reports significant pain despite pain medication adjustments yesterday, especially after drain is being gradually pulled out daily.  Further adjusted pain meds, increased dose of OxyIR and added as needed IV Dilaudid for severe pain to be given prior to drain management.   Discussed with nursing and Dr. Gilford Rile.  Constipation  Complicated by opioid pain meds, nonambulatory status.  Aggressive bowel regimen.  Patient declines Dulcolax suppository.  Postop acute blood loss anemia  Hemoglobin stable in the 11 g range.  Leukocytosis  Secondary to infectious etiology as above.  Resolved  Hyponatremia  Presented with sodium of 129.  Possibly a component of dehydration in the context of sepsis.  Improved to 133.  Resolved.  Acute kidney injury:  Creatinine has improved from 1.21 on admission to 0.95.  Resolved.  Body mass index is 24.41 kg/m.   DVT prophylaxis: SCDs Start: 08/04/20 2102     Code Status: Full Code Family Communication: None at bedside. Disposition:  Status is: Inpatient  Remains inpatient appropriate because:Inpatient level of care appropriate due to severity of illness   Dispo: The patient is from: Home              Anticipated d/c is to: Home              Anticipated d/c date is: > 3 days              Patient currently is not medically stable to d/c.        Consultants:   Urology  Procedures:   As noted above  Antimicrobials:    Anti-infectives (From admission, onward)   Start     Dose/Rate Route Frequency Ordered Stop   08/05/20 1100  vancomycin (VANCOREADY) IVPB 1500 mg/300 mL        1,500 mg 150 mL/hr over 120 Minutes Intravenous Every 12 hours 08/05/20 0919     08/05/20 1000  metroNIDAZOLE (FLAGYL) IVPB  500 mg        500 mg 100 mL/hr over 60 Minutes Intravenous Every 8 hours 08/05/20 0908     08/05/20 0600  vancomycin (VANCOCIN) IVPB 1000 mg/200 mL premix  Status:  Discontinued        1,000 mg 200 mL/hr over 60 Minutes Intravenous Every 8 hours 2020/09/03 2029 08/05/20 0321   09/03/20 2200  ceFEPIme (MAXIPIME) 2 g in sodium chloride 0.9 % 100 mL IVPB        2 g 200 mL/hr over 30 Minutes Intravenous Every 8 hours Sep 03, 2020 2104     09/03/20 2030  vancomycin (VANCOREADY) IVPB 1750 mg/350 mL         1,750 mg 175 mL/hr over 120 Minutes Intravenous  Once 09/03/20 2029 08/05/20 0747   Sep 03, 2020 1700  cefTRIAXone (ROCEPHIN) 2 g in sodium chloride 0.9 % 100 mL IVPB        2 g 200 mL/hr over 30 Minutes Intravenous  Once 09-03-20 1648 2020-09-03 1728        Subjective:  Reports ongoing scrotal pain, not adequately controlled on adjusted pain regimen yesterday.  Reports excruciating scrotal pain especially when scrotal Penrose drain has been gradually removed and the pain lasts for several hours or even the entire day.  No BM in the last 5 to 6 days.  Declines suppository.  Passing flatus.  No abdominal pain, nausea or vomiting.  Did ambulate a little yesterday but limited by pain  Objective:   Vitals:   08/07/20 0800 08/07/20 1313 08/07/20 2021 08/08/20 0618  BP:  123/74 114/72 120/80  Pulse:  87 86 84  Resp: 16 (!) _0 Temp:  97.9 F (36.6 C) 98.4 F (36.9 C) 97.7 F (36.5 C)  TempSrc:  Oral Oral Oral  SpO2:  100% 98% 100%  Weight:      Height:        General exam: Pleasant young male, moderately built and nourished lying comfortably propped up in bed without distress. Respiratory system: Clear to auscultation. Respiratory effort normal. Cardiovascular system: S1 & S2 heard, RRR. No JVD, murmurs, rubs, gallops or clicks. No pedal edema. Gastrointestinal system: Abdomen is nondistended, soft and nontender. No organomegaly or masses felt. Normal bowel sounds heard. GU: Foley catheter in place draining straw-colored urine.  Scrotal area dressing clean and dry. Removed dressing, drain in place with minimal soiling of overlying dressing.  Central nervous system: Alert and oriented. No focal neurological deficits. Extremities: Symmetric 5 x 5 power. Skin: No rashes, lesions or ulcers Psychiatry: Judgement and insight appear normal. Mood & affect appropriate.     Data Reviewed:   I have personally reviewed following labs and imaging studies   CBC: Recent Labs  Lab  September 03, 2020 1706 08/05/20 0513 08/06/20 0504 08/07/20 0442 08/08/20 0545  WBC 22.8*   < > 17.2* 9.3 8.9  NEUTROABS 18.5*  --   --   --   --   HGB 12.3*   < > 10.6* 11.3* 11.1*  HCT 35.9*   < > 31.2* 34.2* 33.5*  MCV 78.9*   < > 80.0 81.0 80.3  PLT 452*   < > 429* 433* 477*   < > = values in this interval not displayed.    Basic Metabolic Panel: Recent Labs  Lab 08/06/20 0504 08/07/20 0442 08/08/20 0545  NA 137 136 137  K 3.8 4.6 4.1  CL 103 102 102  CO2 _1 GLUCOSE 110* 95 94  BUN  _0 CREATININE 0.79 0.83 0.77  CALCIUM 8.1* 8.9 8.5*    Liver Function Tests: Recent Labs  Lab 08/04/20 1706 08/05/20 0513  AST 66* 39  ALT 60* 47*  ALKPHOS 58 55  BILITOT 0.7 0.5  PROT 8.4* 7.5  ALBUMIN 2.8* 2.5*    CBG: No results for input(s): GLUCAP in the last 168 hours.  Microbiology Studies:   Recent Results (from the past 240 hour(s))  Blood culture (routine x 2)     Status: None (Preliminary result)   Collection Time: 08/04/20  4:45 PM   Specimen: BLOOD  Result Value Ref Range Status   Specimen Description   Final    BLOOD RIGHT ANTECUBITAL Performed at [facility] [address] [place], [place] [address]    Special Requests   Final    BOTTLES DRAWN AEROBIC AND ANAEROBIC Blood Culture adequate volume Performed at [facility] [address], [place], [place] [address]    Culture   Final    NO GROWTH 4 DAYS Performed at [facility], [place] [address], [place], [place] [address]    Report Status PENDING  Incomplete  Blood culture (routine x 2)     Status: None (Preliminary result)   Collection Time: 08/04/20  5:06 PM   Specimen: BLOOD LEFT FOREARM  Result Value Ref Range Status   Specimen Description   Final    BLOOD LEFT FOREARM Performed at [facility] [address] [place], [place] [address]    Special Requests   Final    BOTTLES DRAWN AEROBIC AND ANAEROBIC Blood Culture  adequate volume Performed at [facility] [address] [place], [place] [address]    Culture   Final    NO GROWTH 4 DAYS Performed at [facility], [place] [address] [place], [place] [address]    Report Status PENDING  Incomplete  Respiratory Panel by RT PCR (Flu A&B, Covid) - Nasopharyngeal Swab     Status: None   Collection Time: 08/04/20  6:53 PM   Specimen: Nasopharyngeal Swab  Result Value Ref Range Status   SARS Coronavirus 2 by RT PCR NEGATIVE NEGATIVE Final    Comment: (NOTE) SARS-CoV-2 target nucleic acids are NOT DETECTED.  The SARS-CoV-2 RNA is generally detectable in upper respiratoy specimens during the acute phase of infection. The lowest concentration of SARS-CoV-2 viral copies this assay can detect is 131 copies/mL. A negative result does not preclude SARS-Cov-2 infection and should not be used as the sole basis for treatment or other patient management decisions. A negative result may occur with  improper specimen collection/handling, submission of specimen other than nasopharyngeal swab, presence of viral mutation(s) within the areas targeted by this assay, and inadequate number of viral copies (<131 copies/mL). A negative result must be combined with clinical observations, patient history, and epidemiological information. The expected result is Negative.  Fact Sheet for Patients:  PinkCheek.be  Fact Sheet for Healthcare Providers:  GravelBags.it  This test is no t yet approved or cleared by the [place] FDA and  has been authorized for detection and/or diagnosis of SARS-CoV-2 by FDA under an Emergency Use Authorization (EUA). This EUA will remain  in effect (meaning this test can be used) for the duration of the COVID-19 declaration under Section 564(b)(1) of the Act, 21 U.S.C. section 360bbb-3(b)(1), unless the authorization is terminated or revoked sooner.      Influenza A by PCR NEGATIVE NEGATIVE  Final   Influenza B by PCR NEGATIVE NEGATIVE Final    Comment: (NOTE) The Xpert Xpress SARS-CoV-2/FLU/RSV assay is intended as an aid in  the diagnosis of influenza from Nasopharyngeal swab specimens and  should not be used as a sole basis for treatment. Nasal washings and  aspirates are unacceptable for Xpert Xpress SARS-CoV-2/FLU/RSV  testing.  Fact Sheet for Patients: PinkCheek.be  Fact Sheet for Healthcare Providers: GravelBags.it  This test is not yet approved or cleared by the [place] FDA and  has been authorized for detection and/or diagnosis of SARS-CoV-2 by  FDA under an Emergency Use Authorization (EUA). This EUA will remain  in effect (meaning this test can be used) for the duration of the  Covid-19 declaration under Section 564(b)(1) of the Act, 21  U.S.C. section 360bbb-3(b)(1), unless the authorization is  terminated or revoked. Performed at [facility], [place] [address] [place], [place] [address]   Aerobic/Anaerobic Culture (surgical/deep wound)     Status: None (Preliminary result)   Collection Time: 08/04/20 10:22 PM   Specimen: Wound; Abscess  Result Value Ref Range Status   Specimen Description   Final    WOUND Performed at [facility] [address], [place], [place] [address]    Special Requests   Final    NONE Performed at [facility], [place] [address], [place], [place] [address]    Gram Stain   Final    ABUNDANT WBC PRESENT, PREDOMINANTLY PMN ABUNDANT GRAM NEGATIVE RODS ABUNDANT GRAM POSITIVE COCCI IN PAIRS IN CHAINS IN CLUSTERS FEW GRAM POSITIVE RODS    Culture   Final    CULTURE REINCUBATED FOR BETTER GROWTH Performed at [facility], [place] [address] [place], [place] [address]    Report Status PENDING  Incomplete     Radiology Studies:  No results found.   Scheduled Meds:    . Chlorhexidine Gluconate Cloth  6 each Topical Daily  . polyethylene glycol  17 g Oral BID  . senna  2 tablet Oral BID    Continuous Infusions:   . ceFEPime (MAXIPIME) IV 2 g (08/08/20 0617)  . metronidazole 500 mg (08/08/20 0926)  . vancomycin 1,500 mg (08/08/20 1131)     LOS: 4 days     Vernell Leep, MD, [place], [facility]. [facility]    To contact the attending provider between 7A-7P or the covering provider during after hours 7P-7A, please log into the web site [email or web address] and access using universal [place] password for that web site. If you do not have the password, please call the hospital operator.  08/08/2020, 11:34 AM

## 2020-08-09 LAB — CREATININE, SERUM
Creatinine, Ser: 0.87 mg/dL (ref 0.61–1.24)
GFR, Estimated: >60 mL/min (ref >60)

## 2020-08-09 LAB — CULTURE, BLOOD (ROUTINE X 2)
Culture: NO GROWTH
Culture: NO GROWTH
Special Requests: ADEQUATE
Special Requests: ADEQUATE

## 2020-08-09 NOTE — Plan of Care (Signed)
  Problem: Education: Goal: Knowledge of General Education information will improve Description Including pain rating scale, medication(s)/side effects and non-pharmacologic comfort measures Outcome: Progressing   Problem: Health Behavior/Discharge Planning: Goal: Ability to manage health-related needs will improve Outcome: Progressing   Problem: Clinical Measurements: Goal: Ability to maintain clinical measurements within normal limits will improve Outcome: Progressing Goal: Will remain free from infection Outcome: Progressing Goal: Diagnostic test results will improve Outcome: Progressing Goal: Respiratory complications will improve Outcome: Progressing Goal: Cardiovascular complication will be avoided Outcome: Progressing   Problem: Activity: Goal: Risk for activity intolerance will decrease Outcome: Progressing   Problem: Nutrition: Goal: Adequate nutrition will be maintained Outcome: Progressing   Problem: Coping: Goal: Level of anxiety will decrease Outcome: Progressing   Problem: Elimination: Goal: Will not experience complications related to bowel motility Outcome: Progressing Goal: Will not experience complications related to urinary retention Outcome: Progressing   Problem: Pain Managment: Goal: General experience of comfort will improve Outcome: Progressing   Problem: Safety: Goal: Ability to remain free from injury will improve Outcome: Progressing   Problem: Skin Integrity: Goal: Risk for impaired skin integrity will decrease Outcome: Progressing   Problem: Clinical Measurements: Goal: Postoperative complications will be avoided or minimized Outcome: Progressing   Problem: Skin Integrity: Goal: Demonstration of wound healing without infection will improve Outcome: Progressing   

## 2020-08-09 NOTE — Progress Notes (Signed)
5 Days Post-Op Subjective: No acute events overnight.  The patient had a bowel movement yesterday and feels much better.  His Penrose drain has been completely removed with scant drainage in his scrotal dressings.  Objective: Vital signs in last 24 hours: Temp:  [98 F (36.7 C)-98.5 F (36.9 C)] 98.5 F (36.9 C) (10/24 0440) Pulse Rate:  [83-88] 83 (10/24 0440) Resp:  [18-20] 18 (10/24 0440) BP: (120-126)/(71-77) 126/77 (10/24 0440) SpO2:  [99 %-100 %] 100 % (10/24 0440)  Intake/Output from previous day: 10/23 0701 - 10/24 0700 In: 2957.2 [P.O.:720; IV Piggyback:2237.2] Out: 6475 [Urine:6475]  Intake/Output this shift: No intake/output data recorded.  Physical Exam:  General: Alert and oriented Gu: Penrose drain removed.  His right hemiscrotal wound exhibits no evidence of erythema, cellulitis or necrosis.  Minimal clear drainage noted in his scrotal dressing.  Foley catheter in place and draining clear-yellow urine. Ext: NT, No erythema  Lab Results: Recent Labs    08/07/20 0442 08/08/20 0545  HGB 11.3* 11.1*  HCT 34.2* 33.5*   BMET Recent Labs    08/07/20 0442 08/07/20 0442 08/08/20 0545 08/09/20 0533  NA 136  --  137  --   K 4.6  --  4.1  --   CL 102  --  102  --   CO2 27  --  26  --   GLUCOSE 95  --  94  --   BUN 11  --  13  --   CREATININE 0.83   < > 0.77 0.87  CALCIUM 8.9  --  8.5*  --    < > = values in this interval not displayed.     Studies/Results: No results found.  Assessment/Plan: 34yo male with recent hospitalization 10/4-10/8/21 for prostate abscess s/p unsuccessful transurethral unroofing followed by transrectal drain placement with wound cultures growing strep anginosis. Drain was removed prior to hospital discharge, and patient was continued on Levaquin for 4 weeks. Unfortunately, he re-presented to the ED on 10/19 with imaging demonstrating recurrent periurethral abscess with perineal and scrotal involvement. He is now s/p RUG (showed  persistent injury of right lateral prostate capsule unavoidably made during initial cystoscopic procedure), cystourethroscopy, and I&D of perineal abscess with placement of penrose drain.  -Continue wet-to-dry dressings to right hemiscrotal wound -He will need to keep his Foley catheter in for a total of 2 weeks.  I will arrange follow-up in the office for a voiding trial and wound check. -Okay for discharge once his final wound cultures return.    LOS: 5 days   Rhoderick Moody, MD [facility] Pager: [phone]  08/09/2020, 11:27 AM

## 2020-08-09 NOTE — Progress Notes (Signed)
PROGRESS NOTE   Tyler [name]  BVQ:[id number]    DOB: 01-17-1986    DOA: 08/04/2020  PCP: Patient, No Pcp Per   I have briefly reviewed patients previous medical records in [facility].  Chief Complaint  Patient presents with  . post op issues    Brief Narrative:  34 year old male with history of recent admission 10/4-10/05/2020 for prostate abscess s/p unsuccessful transurethral unroofing followed by transrectal drain placement with wound cultures growing strep anginosis, drain removed prior to discharge, continued on levofloxacin, has been following up with urology, had his Foley catheter removed on 07/27/2020, presented to the ED due to worsening pain and swelling involving the perineum which moved towards the right scrotum and base of the penis.  Admitted for sepsis due to perineal/right scrotal abscess, s/p I&D of abscess on night of admission.  Improving.   Assessment & Plan:  Principal Problem:   Sepsis ([place]) Active Problems:   Perineal abscess   Sepsis due to perineal/right scrotal abscess, POA:  Met sepsis criteria on admission.  S/p cystourethroscopy and I&D of abscess by urology in OR on 10/19  Follow-up CT pelvis with contrast 10/20: Complete evacuation of abscess noted.  As per urology follow-up, continue Foley catheter for 1 to 2 weeks to allow prostatic urethra to heal  Continue IV vancomycin, cefepime and Flagyl pending culture sensitivity results.  Lactate normal.  Blood cultures x2: Negative to date.  Abscess culture: Moderate Peptostreptococcus and holding for possible anaerobes.  Scrotal drain removed 10/23.  Pain better controlled.  Having BMs since yesterday after laxatives.    Plan to follow-up final culture results tomorrow, discuss with ID regarding antibiotics and then DC home with outpatient follow-up with urology.?  Augmentin.  Constipation  Complicated by opioid pain meds, nonambulatory status.  Aggressive bowel regimen.  Patient  declines Dulcolax suppository.  Had BM yesterday and a large one today.  Postop acute blood loss anemia  Hemoglobin stable in the 11 g range.  Leukocytosis  Secondary to infectious etiology as above.  Resolved  Hyponatremia  Presented with sodium of 129.  Possibly a component of dehydration in the context of sepsis.  Improved to 133.  Resolved.  Acute kidney injury:  Creatinine has improved from 1.21 on admission to 0.95.  Resolved.  Body mass index is 24.41 kg/m.   DVT prophylaxis: SCDs Start: 08/04/20 2102     Code Status: Full Code Family Communication: None at bedside. Disposition:  Status is: Inpatient  Remains inpatient appropriate because:Inpatient level of care appropriate due to severity of illness   Dispo: The patient is from: Home              Anticipated d/c is to: Home              Anticipated d/c date is: 08/10/2020              Patient currently is not medically stable to d/c.        Consultants:   Urology  Procedures:   As noted above  Antimicrobials:    Anti-infectives (From admission, onward)   Start     Dose/Rate Route Frequency Ordered Stop   08/05/20 1100  vancomycin (VANCOREADY) IVPB 1500 mg/300 mL        1,500 mg 150 mL/hr over 120 Minutes Intravenous Every 12 hours 08/05/20 0919     08/05/20 1000  metroNIDAZOLE (FLAGYL) IVPB 500 mg        500 mg 100 mL/hr over 60 Minutes Intravenous  Every 8 hours 08/05/20 0908     08/05/20 0600  vancomycin (VANCOCIN) IVPB 1000 mg/200 mL premix  Status:  Discontinued        1,000 mg 200 mL/hr over 60 Minutes Intravenous Every 8 hours 08/04/20 2029 08/05/20 0321   08/04/20 2200  ceFEPIme (MAXIPIME) 2 g in sodium chloride 0.9 % 100 mL IVPB        2 g 200 mL/hr over 30 Minutes Intravenous Every 8 hours 08/04/20 2104     08/04/20 2030  vancomycin (VANCOREADY) IVPB 1750 mg/350 mL        1,750 mg 175 mL/hr over 120 Minutes Intravenous  Once 08/04/20 2029 08/05/20 0747   08/04/20 1700   cefTRIAXone (ROCEPHIN) 2 g in sodium chloride 0.9 % 100 mL IVPB        2 g 200 mL/hr over 30 Minutes Intravenous  Once 08/04/20 1648 08/04/20 1728        Subjective:  Seen this morning.  Pain better controlled.  Had a BM yesterday.  Informed by RN that he had another large BM today and also that the scrotal drain has been removed.  Objective:   Vitals:   08/08/20 1408 08/08/20 2035 08/09/20 0440 08/09/20 1403  BP: 120/71 121/74 126/77 134/86  Pulse: 84 88 83 (!) 110  Resp: 20 18 18    Temp: 98 F (36.7 C) 98 F (36.7 C) 98.5 F (36.9 C)   TempSrc: Oral Oral Oral   SpO2: 99% 99% 100% 97%  Weight:      Height:        General exam: Pleasant young male, moderately built and nourished lying comfortably propped up in bed without distress. Respiratory system: Clear to auscultation. Respiratory effort normal. Cardiovascular system: S1 & S2 heard, RRR. No JVD, murmurs, rubs, gallops or clicks. No pedal edema. Gastrointestinal system: Abdomen is nondistended, soft and nontender. No organomegaly or masses felt. Normal bowel sounds heard. GU: Foley catheter in place draining straw-colored urine.  Scrotal area dressing clean and dry. Central nervous system: Alert and oriented. No focal neurological deficits. Extremities: Symmetric 5 x 5 power. Skin: No rashes, lesions or ulcers Psychiatry: Judgement and insight appear normal. Mood & affect appropriate.     Data Reviewed:   I have personally reviewed following labs and imaging studies   CBC: Recent Labs  Lab 08/04/20 1706 08/05/20 0513 08/06/20 0504 08/07/20 0442 08/08/20 0545  WBC 22.8*   < > 17.2* 9.3 8.9  NEUTROABS 18.5*  --   --   --   --   HGB 12.3*   < > 10.6* 11.3* 11.1*  HCT 35.9*   < > 31.2* 34.2* 33.5*  MCV 78.9*   < > 80.0 81.0 80.3  PLT 452*   < > 429* 433* 477*   < > = values in this interval not displayed.    Basic Metabolic Panel: Recent Labs  Lab 08/06/20 0504 08/06/20 0504 08/07/20 0442  08/08/20 0545 08/09/20 0533  NA 137  --  136 137  --   K 3.8  --  4.6 4.1  --   CL 103  --  102 102  --   CO2 26  --  27 26  --   GLUCOSE 110*  --  95 94  --   BUN 17  --  11 13  --   CREATININE 0.79   < > 0.83 0.77 0.87  CALCIUM 8.1*  --  8.9 8.5*  --    < > =  values in this interval not displayed.    Liver Function Tests: Recent Labs  Lab 08/04/20 1706 08/05/20 0513  AST 66* 39  ALT 60* 47*  ALKPHOS 58 55  BILITOT 0.7 0.5  PROT 8.4* 7.5  ALBUMIN 2.8* 2.5*    CBG: No results for input(s): GLUCAP in the last 168 hours.  Microbiology Studies:   Recent Results (from the past 240 hour(s))  Blood culture (routine x 2)     Status: None   Collection Time: 08/04/20  4:45 PM   Specimen: BLOOD  Result Value Ref Range Status   Specimen Description   Final    BLOOD RIGHT ANTECUBITAL Performed at [facility] [address] [place], [place] [address]    Special Requests   Final    BOTTLES DRAWN AEROBIC AND ANAEROBIC Blood Culture adequate volume Performed at [facility] [address], [place], [place] [address]    Culture   Final    NO GROWTH 5 DAYS Performed at [facility], [place] [address] [place], [place] [address]    Report Status 08/09/2020 FINAL  Final  Blood culture (routine x 2)     Status: None   Collection Time: 08/04/20  5:06 PM   Specimen: BLOOD LEFT FOREARM  Result Value Ref Range Status   Specimen Description   Final    BLOOD LEFT FOREARM Performed at [facility] [address] [place], [place] [address]    Special Requests   Final    BOTTLES DRAWN AEROBIC AND ANAEROBIC Blood Culture adequate volume Performed at [facility] [address] [place], [place] [address]    Culture   Final    NO GROWTH 5 DAYS Performed at [facility], [place] [address], [place], [place] [address]    Report Status 08/09/2020 FINAL  Final  Respiratory Panel by RT PCR (Flu A&B,  Covid) - Nasopharyngeal Swab     Status: None   Collection Time: 08/04/20  6:53 PM   Specimen: Nasopharyngeal Swab  Result Value Ref Range Status   SARS Coronavirus 2 by RT PCR NEGATIVE NEGATIVE Final    Comment: (NOTE) SARS-CoV-2 target nucleic acids are NOT DETECTED.  The SARS-CoV-2 RNA is generally detectable in upper respiratoy specimens during the acute phase of infection. The lowest concentration of SARS-CoV-2 viral copies this assay can detect is 131 copies/mL. A negative result does not preclude SARS-Cov-2 infection and should not be used as the sole basis for treatment or other patient management decisions. A negative result may occur with  improper specimen collection/handling, submission of specimen other than nasopharyngeal swab, presence of viral mutation(s) within the areas targeted by this assay, and inadequate number of viral copies (<131 copies/mL). A negative result must be combined with clinical observations, patient history, and epidemiological information. The expected result is Negative.  Fact Sheet for Patients:  PinkCheek.be  Fact Sheet for Healthcare Providers:  GravelBags.it  This test is no t yet approved or cleared by the [place] FDA and  has been authorized for detection and/or diagnosis of SARS-CoV-2 by FDA under an Emergency Use Authorization (EUA). This EUA will remain  in effect (meaning this test can be used) for the duration of the COVID-19 declaration under Section 564(b)(1) of the Act, 21 U.S.C. section 360bbb-3(b)(1), unless the authorization is terminated or revoked sooner.     Influenza A by PCR NEGATIVE NEGATIVE Final   Influenza B by PCR NEGATIVE NEGATIVE Final  Comment: (NOTE) The Xpert Xpress SARS-CoV-2/FLU/RSV assay is intended as an aid in  the diagnosis of influenza from Nasopharyngeal swab specimens and  should not be used as a sole basis for treatment. Nasal  washings and  aspirates are unacceptable for Xpert Xpress SARS-CoV-2/FLU/RSV  testing.  Fact Sheet for Patients: PinkCheek.be  Fact Sheet for Healthcare Providers: GravelBags.it  This test is not yet approved or cleared by the [place] FDA and  has been authorized for detection and/or diagnosis of SARS-CoV-2 by  FDA under an Emergency Use Authorization (EUA). This EUA will remain  in effect (meaning this test can be used) for the duration of the  Covid-19 declaration under Section 564(b)(1) of the Act, 21  U.S.C. section 360bbb-3(b)(1), unless the authorization is  terminated or revoked. Performed at [facility], [place] [address], [place], [place] [address]   Aerobic/Anaerobic Culture (surgical/deep wound)     Status: None (Preliminary result)   Collection Time: 08/04/20 10:22 PM   Specimen: Wound; Abscess  Result Value Ref Range Status   Specimen Description   Final    WOUND Performed at [facility] [address] [place], [place] [address]    Special Requests   Final    NONE Performed at [facility], [place] [address], [place], [place] [address]    Gram Stain   Final    ABUNDANT WBC PRESENT, PREDOMINANTLY PMN ABUNDANT GRAM VARIABLE ROD ABUNDANT GRAM POSITIVE COCCI IN PAIRS IN CHAINS IN CLUSTERS FEW GRAM POSITIVE RODS    Culture   Final    MODERATE PEPTOSTREPTOCOCCUS ASACCHAROLYTICUS HOLDING FOR POSSIBLE ANAEROBE Performed at [facility], [place] [address] [place], [place] [address]    Report Status PENDING  Incomplete     Radiology Studies:  No results found.   Scheduled Meds:   . Chlorhexidine Gluconate Cloth  6 each Topical Daily  . docusate sodium  100 mg Oral BID  . polyethylene glycol  17 g Oral BID  . senna  2 tablet Oral BID    Continuous Infusions:   . ceFEPime (MAXIPIME) IV 2 g (08/09/20 0446)  . metronidazole 500 mg  (08/09/20 0813)  . vancomycin 1,500 mg (08/09/20 1052)     LOS: 5 days     Vernell Leep, MD, [place], [facility]. [facility]    To contact the attending provider between 7A-7P or the covering provider during after hours 7P-7A, please log into the web site [email or web address] and access using universal [place] password for that web site. If you do not have the password, please call the hospital operator.  08/09/2020, 3:24 PM

## 2020-08-10 LAB — AEROBIC/ANAEROBIC CULTURE W GRAM STAIN (SURGICAL/DEEP WOUND)

## 2020-08-10 LAB — CREATININE, SERUM
Creatinine, Ser: 0.82 mg/dL (ref 0.61–1.24)
GFR, Estimated: >60 mL/min (ref >60)

## 2020-08-10 MED ORDER — POLYETHYLENE GLYCOL 3350 17 G PO PACK: 17.0000 g | PACK | Freq: Two times a day (BID) | ORAL | 0 refills | Status: DC

## 2020-08-10 MED ORDER — AMOXICILLIN-POT CLAVULANATE 875-125 MG PO TABS: 1.0000 | ORAL_TABLET | Freq: Two times a day (BID) | ORAL | 0 refills | Status: AC

## 2020-08-10 MED ORDER — ACETAMINOPHEN 325 MG PO TABS: 650.0000 mg | ORAL_TABLET | Freq: Four times a day (QID) | ORAL | Status: DC | PRN

## 2020-08-10 MED ORDER — SENNA 8.6 MG PO TABS: 2.0000 | ORAL_TABLET | Freq: Every day | ORAL | 0 refills | Status: DC | PRN

## 2020-08-10 MED ORDER — OXYCODONE HCL 5 MG PO TABS: 5.0000 mg | ORAL_TABLET | Freq: Three times a day (TID) | ORAL | 0 refills | Status: DC | PRN

## 2020-08-10 NOTE — Plan of Care (Signed)
  Problem: Education: Goal: Knowledge of General Education information will improve Description Including pain rating scale, medication(s)/side effects and non-pharmacologic comfort measures Outcome: Progressing   Problem: Health Behavior/Discharge Planning: Goal: Ability to manage health-related needs will improve Outcome: Progressing   Problem: Clinical Measurements: Goal: Ability to maintain clinical measurements within normal limits will improve Outcome: Progressing Goal: Will remain free from infection Outcome: Progressing Goal: Diagnostic test results will improve Outcome: Progressing Goal: Respiratory complications will improve Outcome: Progressing Goal: Cardiovascular complication will be avoided Outcome: Progressing   Problem: Activity: Goal: Risk for activity intolerance will decrease Outcome: Progressing   Problem: Nutrition: Goal: Adequate nutrition will be maintained Outcome: Progressing   Problem: Coping: Goal: Level of anxiety will decrease Outcome: Progressing   Problem: Elimination: Goal: Will not experience complications related to bowel motility Outcome: Progressing Goal: Will not experience complications related to urinary retention Outcome: Progressing   Problem: Pain Managment: Goal: General experience of comfort will improve Outcome: Progressing   Problem: Safety: Goal: Ability to remain free from injury will improve Outcome: Progressing   Problem: Skin Integrity: Goal: Risk for impaired skin integrity will decrease Outcome: Progressing   Problem: Clinical Measurements: Goal: Postoperative complications will be avoided or minimized Outcome: Progressing   Problem: Skin Integrity: Goal: Demonstration of wound healing without infection will improve Outcome: Progressing   

## 2020-08-10 NOTE — Discharge Summary (Signed)
Physician Discharge Summary  Tyler [name] INO:[id number] DOB: 10-20-1985  PCP: Patient, No Pcp Per  Admitted from: Home Discharged to: Home  Admit date: 08/04/2020 Discharge date: 08/10/2020  Recommendations for Outpatient Follow-up:    Follow-up Information    [facility] Follow up on 08/18/2020.   Why: Catheter removal and postop follow-up. Contact information: [address] [place]. Schedule an appointment as soon as possible for a visit in 1 week(s).   Why: Call and make an appointment to become established in 1-2 weeks.  To be seen with repeat labs (CBC & BMP).  Follow-up urine gonococcal and chlamydia test results sent from the hospital, at the time of this visit. Contact information: [place] [id number] [place]: None    Equipment/Devices: Patient will be discharging with indwelling Foley catheter until outpatient follow-up with urology.    Discharge Condition: Improved and stable.   Code Status: Full Code Diet recommendation:  Discharge Diet Orders (From admission, onward)    Start     Ordered   08/10/20 0000  Diet general        08/10/20 1503           Discharge Diagnoses:  Principal Problem:   Sepsis [facility]) Active Problems:   Perineal abscess   Brief Summary: 34 year old male with history of recent admission 10/4-10/05/2020 for prostate abscess s/p unsuccessful transurethral unroofing followed by transrectal drain placement with wound cultures growing strep anginosis, drain removed prior to discharge, continued on levofloxacin, has been following up with urology, had his Foley catheter removed on 07/27/2020, presented to the ED due to worsening pain and swelling involving the perineum which moved towards the right scrotum and base of the penis.  Admitted for sepsis due to  perineal/right scrotal abscess, s/p I&D of abscess on night of admission.   Assessment & Plan:   Sepsis due to perineal/right scrotal abscess, POA:  Met sepsis criteria on admission.  S/p cystourethroscopy and I&D of abscess by urology in OR on 10/19  Follow-up CT pelvis with contrast 10/20: Complete evacuation of abscess noted.  As per urology follow-up, continue Foley catheter for 1 to 2 weeks to allow prostatic urethra to heal.  [facility] appointment [facility] on 08[facility]18/2020 to remove the Foley catheter.  Completed almost 1 week of empiric IV vancomycin, cefepime and Flagyl pending culture sensitivity results.  Lactate normal.  Blood cultures x2: Negative, final report.  Abscess culture: Finally reported today is mixed anaerobic flora.  No growth aerobically in 5 days.  Scrotal drain removed 10/23.  Pain control gradually improved with drain removal, adjustment of pain medications and BMs after laxatives.    I discussed in detail with infectious disease MD on call who recommended getting urine chlamydia and GC probe-sent and needs to be followed up during outpatient visit and recommended additional 2 weeks of Augmentin to complete total 3-week course.  Sepsis resolved.  Close outpatient follow-up with Urology.  Constipation  Complicated by opioid pain meds, nonambulatory status.  Aggressive bowel regimen.  Now having BMs.  Continue MiraLAX and as needed senna for a few more days.  Postop acute blood loss anemia  Hemoglobin stable in the 11 g range.  Leukocytosis  Secondary to infectious etiology as above.  Resolved  Hyponatremia  Presented with sodium of 129.  Possibly a component of dehydration in the context of sepsis.  Improved to 133.  Resolved.  Acute kidney injury:  Creatinine has improved from 1.21 on admission to 0.95.  Resolved.  Body mass index is 24.41 kg/m.   Consultants:   Urology  Procedures:   As noted  above    Discharge Instructions  Discharge Instructions    Call MD for:  difficulty breathing, headache or visual disturbances   Complete by: As directed    Call MD for:  extreme fatigue   Complete by: As directed    Call MD for:  persistant dizziness or light-headedness   Complete by: As directed    Call MD for:  persistant nausea and vomiting   Complete by: As directed    Call MD for:  redness, tenderness, or signs of infection (pain, swelling, redness, odor or green/yellow discharge around incision site)   Complete by: As directed    Call MD for:  severe uncontrolled pain   Complete by: As directed    Call MD for:  temperature >100.4   Complete by: As directed    Diet general   Complete by: As directed    Discharge wound care:   Complete by: As directed    Wet-to-dry dressing changes daily and as needed, to scrotal surgical site.   Increase activity slowly   Complete by: As directed        Medication List    STOP taking these medications   cyclobenzaprine 10 MG tablet Commonly known as: FLEXERIL   levofloxacin 750 MG tablet Commonly known as: Levaquin   meloxicam 15 MG tablet Commonly known as: MOBIC     TAKE these medications   acetaminophen 325 MG tablet Commonly known as: TYLENOL Take 2 tablets (650 mg total) by mouth every 6 (six) hours as needed for mild pain or fever (or Fever >/= 101).   amoxicillin-clavulanate 875-125 MG tablet Commonly known as: Augmentin Take 1 tablet by mouth 2 (two) times daily for 14 days.   oxyCODONE 5 MG immediate release tablet Commonly known as: Oxy IR/ROXICODONE Take 1 tablet (5 mg total) by mouth every 8 (eight) hours as needed for moderate pain or severe pain.   polyethylene glycol 17 g packet Commonly known as: MIRALAX / GLYCOLAX Take 17 g by mouth 2 (two) times daily.   senna 8.6 MG Tabs tablet Commonly known as: SENOKOT Take 2 tablets (17.2 mg total) by mouth daily as needed for mild constipation or moderate  constipation.   traZODone 50 MG tablet Commonly known as: DESYREL Take 50 mg by mouth at bedtime.      No Known Allergies    Procedures/Studies: CT PELVIS W CONTRAST  Result Date: 08/05/2020 CLINICAL DATA:  Rectal abscess EXAM: CT PELVIS WITH CONTRAST TECHNIQUE: Multidetector CT imaging of the pelvis was performed using the standard protocol following the bolus administration of intravenous contrast. CONTRAST:  129m OMNIPAQUE IOHEXOL 300 MG/ML  SOLN COMPARISON:  08/04/2020 FINDINGS: Urinary Tract: A Foley catheter has been placed with the retaining balloon seen within the bladder lumen. There is subtotal decompression of the bladder lumen. The distal ureters appear decompressed. The previously noted abscess appears to involve the base of the penis, as described below. Bowel: The visualized small and large bowel are unremarkable. There has developed mild free fluid within the pelvis, new from prior examination. Vascular/Lymphatic: The visualized pelvic vasculature is unremarkable. No pathologic adenopathy is seen within the abdomen and pelvis. Reproductive:  Inflammatory changes within the base of the penis efface the borders of the prostate gland. Similar prior examination, the gland demonstrates diffuse hypoenhancement, nonspecific. No discrete intraparenchymal fluid collections are identified. Seminal vesicles are unremarkable. Other: The previously identified loculated fluid collection within the right hemiscrotum extending into the base of the penis has been evacuated. A Penrose drain is seen within the abscess cavity which appears to abut the bulbar urethra as indicated by the indwelling catheter. A small amount of gas is seen within the residual cavity. No residual drainable fluid collection is identified. There is effacement of the fat planes related to interstitial edema. Musculoskeletal: The osseous structures are unremarkable. IMPRESSION: Interval incision and drainage of the peroneal  abscess involving the right hemiscrotum and base of the penis with complete evacuation of the drainable fluid component noted on prior examination. Placement of a Penrose drainage catheter within the abscess cavity which abuts the bulbar urethra. Direct communication between the abscess cavity and the bulbar urethra is difficult to exclude on this examination. Persistent diffuse low attenuation of the a prostate gland, nonspecific, without discrete drainable fluid collection identified. Interval development of mild free fluid within the pelvis. Electronically Signed   By: Fidela Salisbury MD   On: 08/05/2020 15:50   CT PELVIS W CONTRAST  Result Date: 08/04/2020 CLINICAL DATA:  Right testicular swelling. EXAM: CT PELVIS WITH CONTRAST TECHNIQUE: Multidetector CT imaging of the pelvis was performed using the standard protocol following the bolus administration of intravenous contrast. CONTRAST:  122m OMNIPAQUE IOHEXOL 300 MG/ML  SOLN COMPARISON:  July 23, 2020 FINDINGS: Urinary Tract: No abnormality visualized. The Foley catheter seen within the urinary bladder on the prior study has been removed. Bowel:  Unremarkable visualized pelvic bowel loops. Vascular/Lymphatic: No pathologically enlarged lymph nodes. No significant vascular abnormality seen. Reproductive: The small pigtail drainage catheter seen the expected region of the prostate and base of the penis on the prior study has been removed. A large, approximately 3.4 cm x 10.8 cm x 4.6 cm lobulated, complex fluid collection, with surrounding mildly hyperdense wall, is seen involving from the region just below the prostate gland, with inferior extension to include the base of the penis, with subsequent anterior extension into the scrotal wall on the right. This represents a new finding when compared to the prior study. A moderate to marked amount of surrounding soft tissue edema and inflammatory fat stranding is seen. No soft tissue air is identified Other:   None. Musculoskeletal: No acute osseous abnormalities are identified. IMPRESSION: 1. Interval removal of the small pigtail drainage catheter and Foley catheter seen on the prior study. 2. Large perineal abscess involving the region just below the prostate gland, base of the penis and right scrotal wall, which represents a new finding when compared to the prior study. Electronically Signed   By: TVirgina NorfolkM.D.   On: 08/04/2020 18:07   DG C-Arm 1-60 Min-No Report  Result Date: 08/04/2020 Fluoroscopy was utilized by the requesting physician.  No radiographic interpretation.     Subjective: Anxious to be discharged home.  Had a large BM yesterday.  Passing flatus.  Tolerating diet without nausea or vomiting.  Surgical site pain continues to gradually improve and requiring less pain medications.  Drain completely out yesterday.  Nursing has educated patient regarding dressing changes.  Discharge Exam:  Vitals:   08/09/20 1403 08/09/20 2041 08/10/20 0537 08/10/20 1334  BP: 134/86 125/69 115/80 129/77  Pulse: (!) 110 85 60 86  Resp: 18 16 16  18  Temp:  98.3 F (36.8 C) 98.2 F (36.8 C) 98.5 F (36.9 C)  TempSrc:  Oral Oral Oral  SpO2: 97% 97% 100% 100%  Weight:      Height:        General exam: Pleasant young male, moderately built and nourished lying comfortably propped up in bed without distress. Respiratory system: Clear to auscultation. Respiratory effort normal. Cardiovascular system: S1 & S2 heard, RRR. No JVD, murmurs, rubs, gallops or clicks. No pedal edema. Gastrointestinal system: Abdomen is nondistended, soft and nontender. No organomegaly or masses felt. Normal bowel sounds heard. GU: Foley catheter in place draining straw-colored urine.  Scrotal area dressing clean and dry. Central nervous system: Alert and oriented. No focal neurological deficits. Extremities: Symmetric 5 x 5 power. Skin: No rashes, lesions or ulcers Psychiatry: Judgement and insight appear normal.  Mood & affect appropriate.    The results of significant diagnostics from this hospitalization (including imaging, microbiology, ancillary and laboratory) are listed below for reference.     Microbiology: Recent Results (from the past 240 hour(s))  Blood culture (routine x 2)     Status: None   Collection Time: 08/04/20  4:45 PM   Specimen: BLOOD  Result Value Ref Range Status   Specimen Description   Final    BLOOD RIGHT ANTECUBITAL Performed at [facility] [address] [place], [place] [address]    Special Requests   Final    BOTTLES DRAWN AEROBIC AND ANAEROBIC Blood Culture adequate volume Performed at [facility] [address], [place], [place] [address]    Culture   Final    NO GROWTH 5 DAYS Performed at [facility], [place] [address], [place], [place] [address]    Report Status 08/09/2020 FINAL  Final  Blood culture (routine x 2)     Status: None   Collection Time: 08/04/20  5:06 PM   Specimen: BLOOD LEFT FOREARM  Result Value Ref Range Status   Specimen Description   Final    BLOOD LEFT FOREARM Performed at [facility] [address], [place], [place] [address]    Special Requests   Final    BOTTLES DRAWN AEROBIC AND ANAEROBIC Blood Culture adequate volume Performed at [facility] [address], [place], [place] [address]    Culture   Final    NO GROWTH 5 DAYS Performed at [facility], [place] [address] [place],  [address]    Report Status 08/09/2020 FINAL  Final  Respiratory Panel by RT PCR (Flu A&B, Covid) - Nasopharyngeal Swab     Status: None   Collection Time: 08/04/20  6:53 PM   Specimen: Nasopharyngeal Swab  Result Value Ref Range Status   SARS Coronavirus 2 by RT PCR NEGATIVE NEGATIVE Final    Comment: (NOTE) SARS-CoV-2 target nucleic acids are NOT DETECTED.  The SARS-CoV-2 RNA is generally detectable in upper respiratoy specimens during the acute  phase of infection. The lowest concentration of SARS-CoV-2 viral copies this assay can detect is 131 copies/mL. A negative result does not preclude SARS-Cov-2 infection and should not be used as the sole basis for treatment or other patient management decisions. A negative result may occur with  improper specimen collection/handling, submission of specimen other than nasopharyngeal swab, presence of viral mutation(s) within the areas targeted by this assay, and inadequate number of viral copies (<131 copies/mL). A negative result must be combined with clinical observations, patient history, and  epidemiological information. The expected result is Negative.  Fact Sheet for Patients:  PinkCheek.be  Fact Sheet for Healthcare Providers:  GravelBags.it  This test is no t yet approved or cleared by the [place] FDA and  has been authorized for detection and/or diagnosis of SARS-CoV-2 by FDA under an Emergency Use Authorization (EUA). This EUA will remain  in effect (meaning this test can be used) for the duration of the COVID-19 declaration under Section 564(b)(1) of the Act, 21 U.S.C. section 360bbb-3(b)(1), unless the authorization is terminated or revoked sooner.     Influenza A by PCR NEGATIVE NEGATIVE Final   Influenza B by PCR NEGATIVE NEGATIVE Final    Comment: (NOTE) The Xpert Xpress SARS-CoV-2/FLU/RSV assay is intended as an aid in  the diagnosis of influenza from Nasopharyngeal swab specimens and  should not be used as a sole basis for treatment. Nasal washings and  aspirates are unacceptable for Xpert Xpress SARS-CoV-2/FLU/RSV  testing.  Fact Sheet for Patients: PinkCheek.be  Fact Sheet for Healthcare Providers: GravelBags.it  This test is not yet approved or cleared by the [place] FDA and  has been authorized for detection and/or diagnosis of  SARS-CoV-2 by  FDA under an Emergency Use Authorization (EUA). This EUA will remain  in effect (meaning this test can be used) for the duration of the  Covid-19 declaration under Section 564(b)(1) of the Act, 21  U.S.C. section 360bbb-3(b)(1), unless the authorization is  terminated or revoked. Performed at [facility], [place] [address] [place], [place] [address]   Aerobic/Anaerobic Culture (surgical/deep wound)     Status: None   Collection Time: 08/04/20 10:22 PM   Specimen: Wound; Abscess  Result Value Ref Range Status   Specimen Description   Final    WOUND Performed at [facility] [address], [place], [place] [address]    Special Requests   Final    NONE Performed at [facility], [place] [address] [place], [place] [address]    Gram Stain   Final    ABUNDANT WBC PRESENT, PREDOMINANTLY PMN ABUNDANT GRAM VARIABLE ROD ABUNDANT GRAM POSITIVE COCCI IN PAIRS IN CHAINS IN CLUSTERS FEW GRAM POSITIVE RODS    Culture   Final    MIXED ANAEROBIC FLORA PRESENT.  CALL LAB IF FURTHER IID REQUIRED. NO GROWTH AEROBICALLY IN 5 DAYS Performed at [facility] [address] [place],  [address]    Report Status 08/10/2020 FINAL  Final     Labs: CBC: Recent Labs  Lab 08/04/20 1706 08/05/20 0513 08/06/20 0504 08/07/20 0442 08/08/20 0545  WBC 22.8* 25.9* 17.2* 9.3 8.9  NEUTROABS 18.5*  --   --   --   --   HGB 12.3* 10.9* 10.6* 11.3* 11.1*  HCT 35.9* 32.0* 31.2* 34.2* 33.5*  MCV 78.9* 79.4* 80.0 81.0 80.3  PLT 452* 403* 429* 433* 477*    Basic Metabolic Panel: Recent Labs  Lab 08/04/20 1706 08/04/20 1706 08/05/20 0513 08/05/20 0513 08/06/20 0504 08/07/20 0442 08/08/20 0545 08/09/20 0533 08/10/20 0522  NA 129*  --  133*  --  137 136 137  --   --   K 4.1  --  4.4  --  3.8 4.6 4.1  --   --   CL 94*  --  97*  --  103 102 102  --   --   CO2 26  --  26  --  _0 --   --  GLUCOSE 95  --  152*   --  110* 95 94  --   --   BUN 18  --  17  --  _0 --   --   CREATININE 1.21   < > 0.95   < > 0.79 0.83 0.77 0.87 0.82  CALCIUM 8.5*  --  8.2*  --  8.1* 8.9 8.5*  --   --    < > = values in this interval not displayed.    Liver Function Tests: Recent Labs  Lab 08/04/20 1706 08/05/20 0513  AST 66* 39  ALT 60* 47*  ALKPHOS 58 55  BILITOT 0.7 0.5  PROT 8.4* 7.5  ALBUMIN 2.8* 2.5*     Urinalysis    Component Value Date/Time   COLORURINE YELLOW 08/04/2020 1604   APPEARANCEUR CLEAR 08/04/2020 1604   LABSPEC >1.046 (H) 08/04/2020 1604   PHURINE 6.0 08/04/2020 1604   GLUCOSEU NEGATIVE 08/04/2020 1604   HGBUR SMALL (A) 08/04/2020 1604   BILIRUBINUR NEGATIVE 08/04/2020 [place] 08/04/2020 1604   PROTEINUR NEGATIVE 08/04/2020 1604   NITRITE NEGATIVE 08/04/2020 1604   LEUKOCYTESUR MODERATE (A) 08/04/2020 1604      Time coordinating discharge: 40 minutes  SIGNED:  Vernell Leep, MD, FACP, [facility]. [facility]  To contact the attending provider between 7A-7P or the covering provider during after hours 7P-7A, please log into the web site [email or web address] and access using universal [place] password for that web site. If you do not have the password, please call the hospital operator.

## 2020-08-10 NOTE — Discharge Instructions (Addendum)

## 2020-08-11 LAB — GC/CHLAMYDIA PROBE AMP (~~LOC~~) NOT AT ARMC
Chlamydia: NEGATIVE
Comment: NEGATIVE
Comment: NORMAL
Neisseria Gonorrhea: NEGATIVE

## 2020-08-17 IMAGING — CT CT RENAL STONE PROTOCOL
2 of 4 series · 15 of 46 positions shown, 17 images · non-contrast
Comparison: None.

CLINICAL DATA: Flank pain and hematuria

EXAM:
CT ABDOMEN AND PELVIS WITHOUT CONTRAST
TECHNIQUE: Multidetector CT imaging of the abdomen and pelvis was performed
following the standard protocol without oral or IV contrast.

[Series 2: axial st · axial · 0.66mm/px · z∈[-486,-76]mm · 12 of 92 slices shown, 14 images]
[im 5/92  soft-tissue]
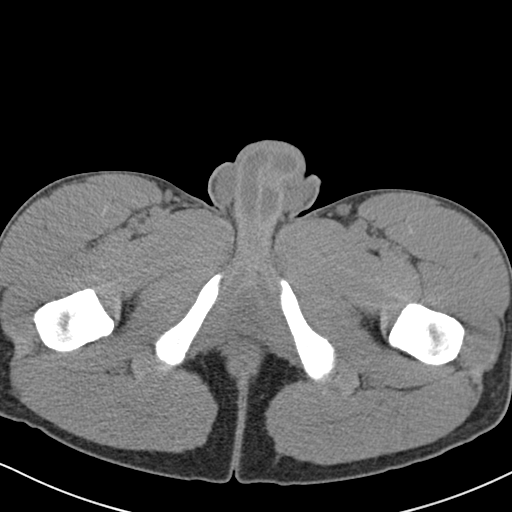
[im 5/92  bone]
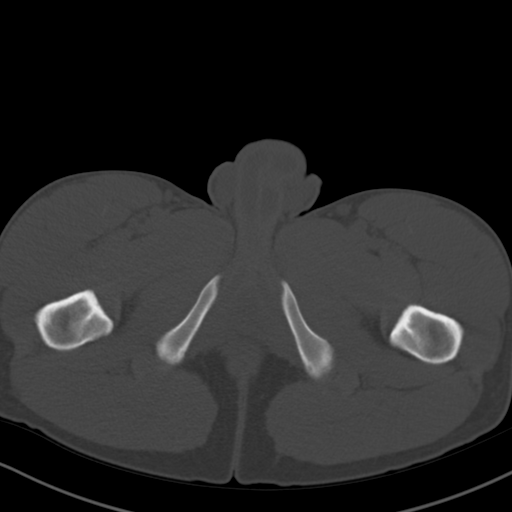
[im 14/92  soft-tissue]
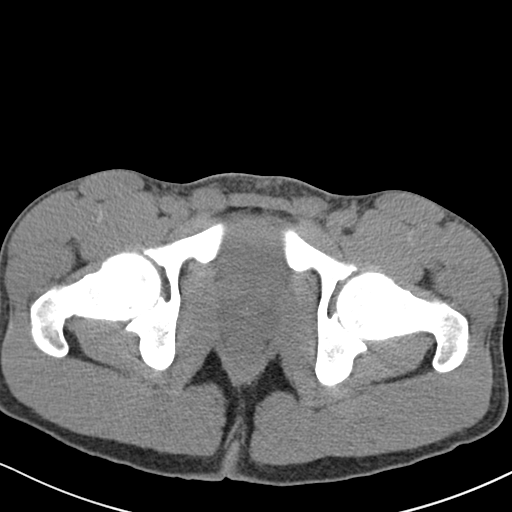
[im 19/92  soft-tissue]
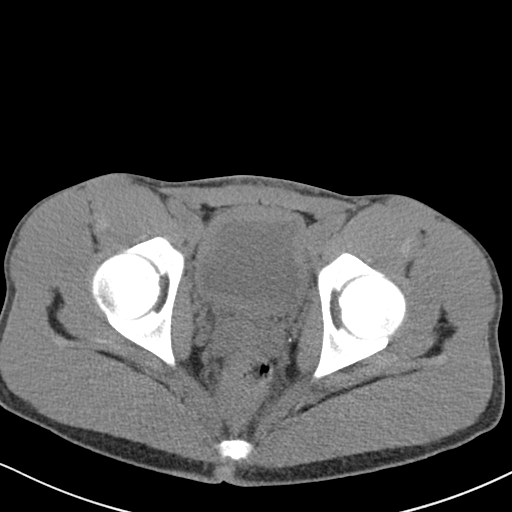
[im 28/92  soft-tissue]
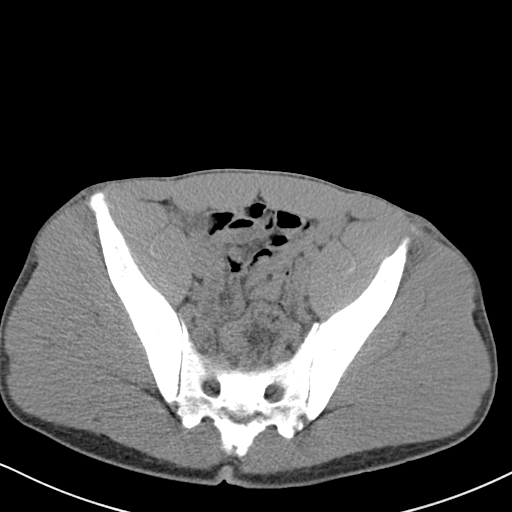
[im 37/92  soft-tissue]
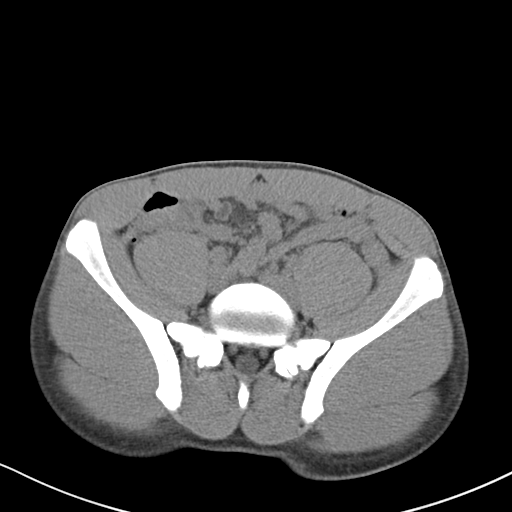
[im 41/92  soft-tissue]
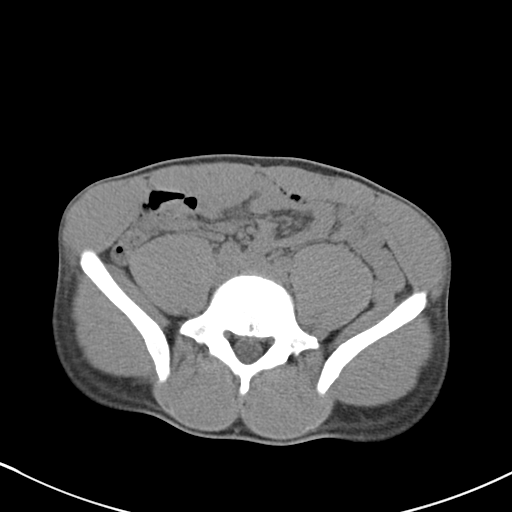
[im 51/92  soft-tissue]
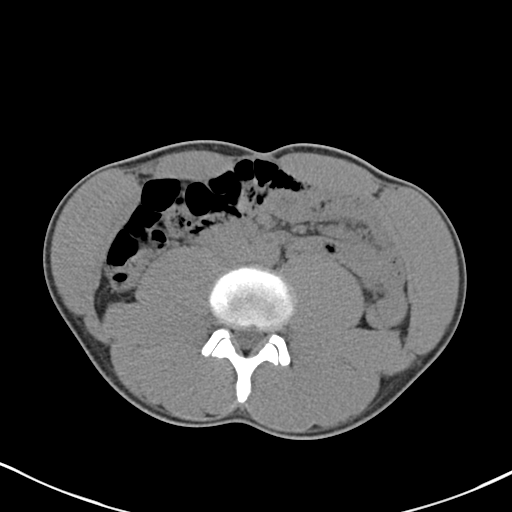
[im 55/92  soft-tissue]
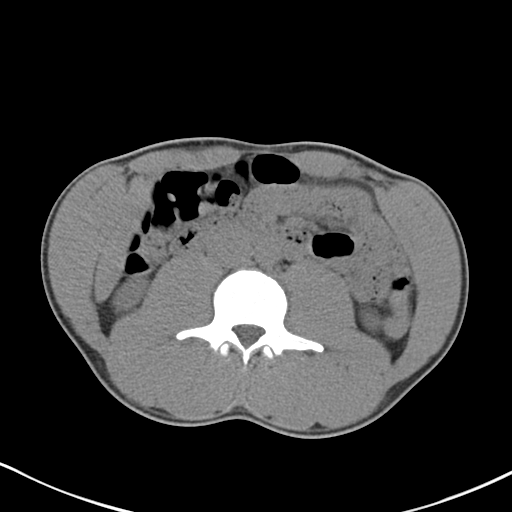
[im 64/92  soft-tissue]
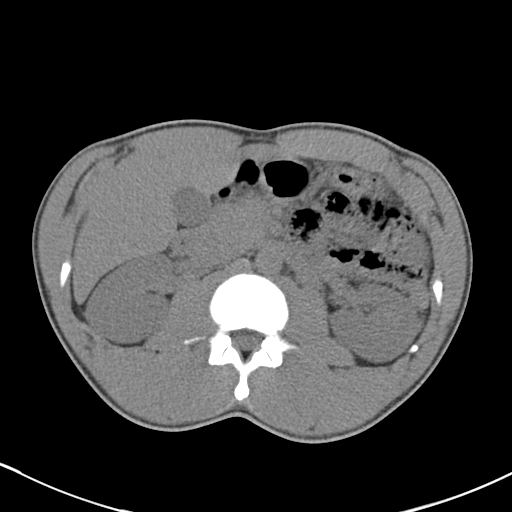
[im 64/92  bone]
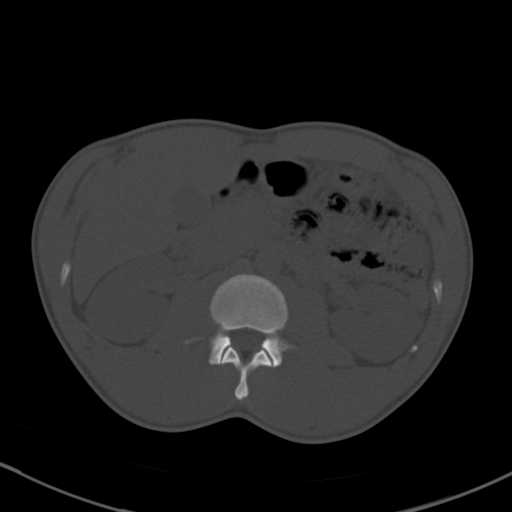
[im 73/92  soft-tissue]
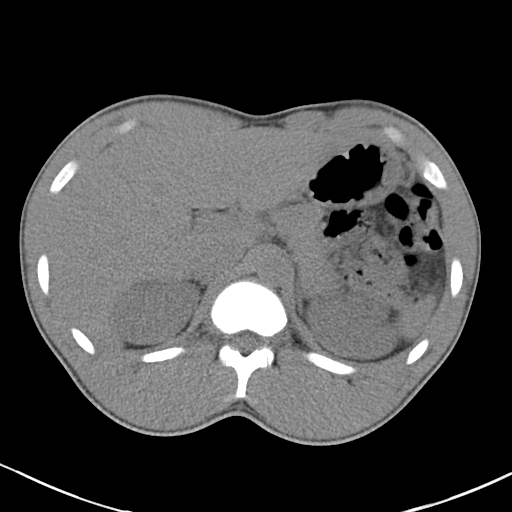
[im 78/92  soft-tissue]
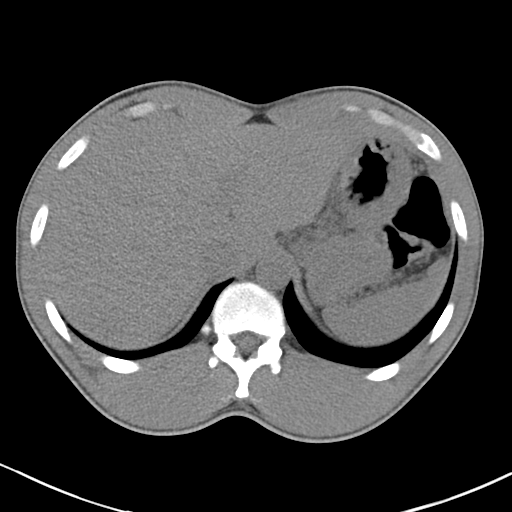
[im 87/92  soft-tissue]
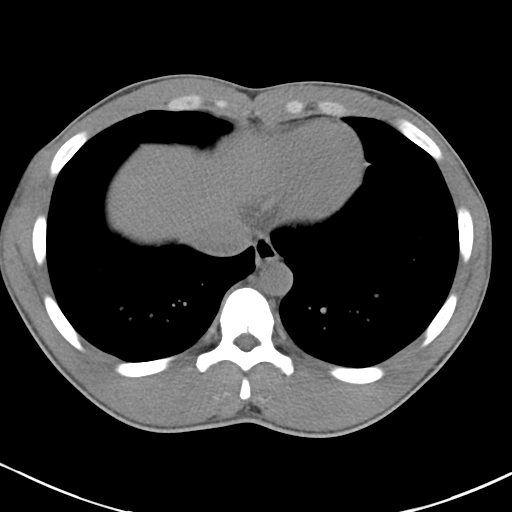

[Series 5: coronal · coronal · 0.63mm/px · 3 of 133 slices shown]
[im 45/133  soft-tissue]
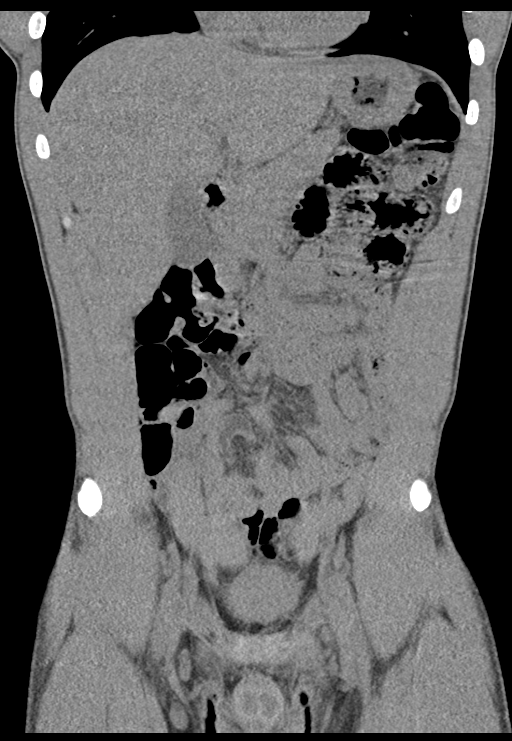
[im 59/133  soft-tissue]
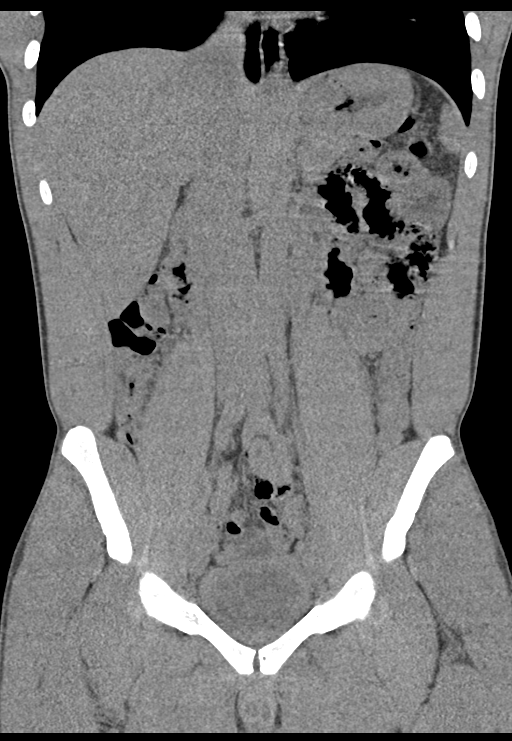
[im 74/133  soft-tissue]
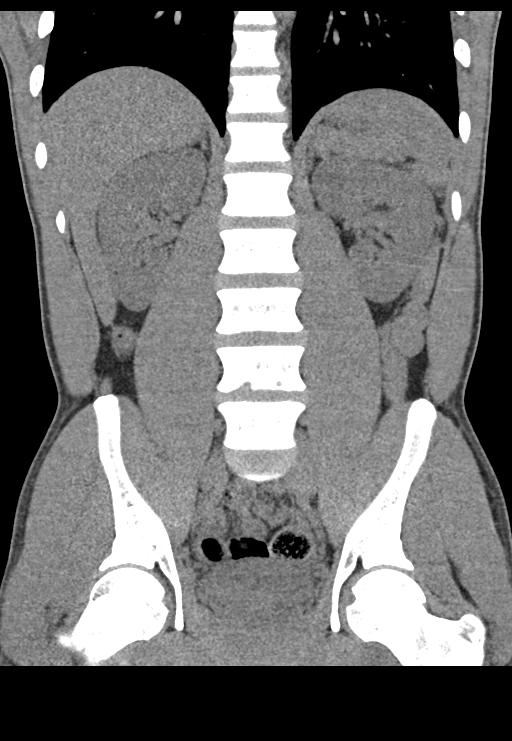

[15 of 46 positions shown; findings below may reference images not displayed]

FINDINGS: Lower chest: Lung bases are clear.

Hepatobiliary: No focal liver lesions are evident on this
noncontrast enhanced study. The gallbladder wall is not appreciably
thickened. There is no biliary duct dilatation.

Pancreas: There is no evident pancreatic mass or inflammatory focus.

Spleen: No splenic lesions are appreciable.

Adrenals/Urinary Tract: Adrenals bilaterally appear unremarkable.
Kidneys bilaterally show no evident mass or hydronephrosis on either
side. There is no appreciable renal or ureteral calculus on either
side. Urinary bladder wall is diffusely thickened.

Stomach/Bowel: There is no appreciable bowel wall or mesenteric
thickening. No bowel obstruction. Terminal ileum appears normal. No
evident free air or portal venous air.

Vascular/Lymphatic: No abdominal aortic aneurysm. No vascular
lesions are evident on this noncontrast enhanced study. There is no
adenopathy in the abdomen or pelvis.

Reproductive: Prostate and seminal vesicles are normal in size and
contour. There is no evident pelvic mass.

Other: Appendix appears normal. No abscess or ascites evident in the
abdomen or pelvis.

Musculoskeletal: No blastic or lytic bone lesions. A focal Schmorl's
node is noted along the inferior aspect of the L4 vertebral body.
There is no abdominal wall or intramuscular lesion.
IMPRESSION: 1. Diffuse urinary bladder wall thickening, a finding felt to be
indicative of a degree of cystitis. No hydronephrosis on either
side. No renal or ureteral calculus.

2. No bowel obstruction. No abscess in the abdomen or pelvis.
Appendix appears normal.

## 2020-10-18 ENCOUNTER — Encounter (HOSPITAL_COMMUNITY): Payer: Self-pay | Admitting: Emergency Medicine

## 2020-10-18 ENCOUNTER — Emergency Department (HOSPITAL_COMMUNITY): Payer: Self-pay

## 2020-10-18 ENCOUNTER — Emergency Department (HOSPITAL_COMMUNITY)
Admission: EM | Admit: 2020-10-18 | Discharge: 2020-10-18 | Disposition: A | Discharge status: Home / Self Care | Payer: Self-pay | Attending: Emergency Medicine | Admitting: Emergency Medicine

## 2020-10-18 ENCOUNTER — Other Ambulatory Visit: Payer: Self-pay

## 2020-10-18 DIAGNOSIS — R1030 Lower abdominal pain, unspecified: Secondary | ICD-10-CM | POA: Insufficient documentation

## 2020-10-18 DIAGNOSIS — K6289 Other specified diseases of anus and rectum: Secondary | ICD-10-CM | POA: Insufficient documentation

## 2020-10-18 DIAGNOSIS — Z5321 Procedure and treatment not carried out due to patient leaving prior to being seen by health care provider: Secondary | ICD-10-CM | POA: Insufficient documentation

## 2020-10-18 LAB — CBC WITH DIFFERENTIAL/PLATELET
Abs Immature Granulocytes: 0.08 10*3/uL — ABNORMAL HIGH (ref 0.00–0.07)
Basophils Absolute: 0 10*3/uL (ref 0.0–0.1)
Basophils Relative: 0 %
Eosinophils Absolute: 0 10*3/uL (ref 0.0–0.5)
Eosinophils Relative: 0 %
HCT: 43.9 % (ref 39.0–52.0)
Hemoglobin: 14.4 g/dL (ref 13.0–17.0)
Immature Granulocytes: 0 %
Lymphocytes Relative: 9 %
Lymphs Abs: 1.8 10*3/uL (ref 0.7–4.0)
MCH: 26.8 pg (ref 26.0–34.0)
MCHC: 32.8 g/dL (ref 30.0–36.0)
MCV: 81.8 fL (ref 80.0–100.0)
Monocytes Absolute: 1.7 10*3/uL — ABNORMAL HIGH (ref 0.1–1.0)
Monocytes Relative: 9 %
Neutro Abs: 15.8 10*3/uL — ABNORMAL HIGH (ref 1.7–7.7)
Neutrophils Relative %: 82 %
Platelets: 275 10*3/uL (ref 150–400)
RBC: 5.37 MIL/uL (ref 4.22–5.81)
RDW: 15 % (ref 11.5–15.5)
WBC: 19.4 10*3/uL — ABNORMAL HIGH (ref 4.0–10.5)
nRBC: 0 % (ref 0.0–0.2)

## 2020-10-18 LAB — COMPREHENSIVE METABOLIC PANEL
ALT: 14 U/L (ref 0–44)
AST: 19 U/L (ref 15–41)
Albumin: 3.9 g/dL (ref 3.5–5.0)
Alkaline Phosphatase: 59 U/L (ref 38–126)
Anion gap: 13 (ref 5–15)
BUN: 9 mg/dL (ref 6–20)
CO2: 24 mmol/L (ref 22–32)
Calcium: 9.3 mg/dL (ref 8.9–10.3)
Chloride: 97 mmol/L — ABNORMAL LOW (ref 98–111)
Creatinine, Ser: 1.11 mg/dL (ref 0.61–1.24)
GFR, Estimated: >60 mL/min (ref >60)
Glucose, Bld: 94 mg/dL (ref 70–99)
Potassium: 3.9 mmol/L (ref 3.5–5.1)
Sodium: 134 mmol/L — ABNORMAL LOW (ref 135–145)
Total Bilirubin: 0.7 mg/dL (ref 0.3–1.2)
Total Protein: 8.4 g/dL — ABNORMAL HIGH (ref 6.5–8.1)

## 2020-10-18 LAB — URINALYSIS, ROUTINE W REFLEX MICROSCOPIC
Bacteria, UA: NONE SEEN
Bilirubin Urine: NEGATIVE
Glucose, UA: NEGATIVE mg/dL
Ketones, ur: 5 mg/dL — AB
Nitrite: NEGATIVE
Protein, ur: 30 mg/dL — AB
Specific Gravity, Urine: 1.027 (ref 1.005–1.030)
WBC, UA: >50 WBC/hpf — ABNORMAL HIGH (ref 0–5)
pH: 5 (ref 5.0–8.0)

## 2020-10-18 LAB — LACTIC ACID, PLASMA: Lactic Acid, Venous: 1.3 mmol/L (ref 0.5–1.9)

## 2020-10-18 MED ORDER — ACETAMINOPHEN 325 MG PO TABS
650.0000 mg | ORAL_TABLET | Freq: Once | ORAL | Status: AC | PRN
  Administered 2020-10-18: 650 mg via ORAL
  Filled 2020-10-18: qty 2

## 2020-10-18 NOTE — ED Notes (Signed)
Called for room placement x1 with no answer. 

## 2020-10-18 NOTE — ED Triage Notes (Signed)
Patient reports pain in groin and rectum x2 weeks. Hx perianal abscess with surgery in October.

## 2020-10-19 ENCOUNTER — Other Ambulatory Visit: Payer: Self-pay

## 2020-10-19 ENCOUNTER — Encounter (HOSPITAL_COMMUNITY): Payer: Self-pay | Admitting: Emergency Medicine

## 2020-10-19 DIAGNOSIS — N492 Inflammatory disorders of scrotum: Secondary | ICD-10-CM | POA: Diagnosis present

## 2020-10-19 DIAGNOSIS — R369 Urethral discharge, unspecified: Secondary | ICD-10-CM | POA: Diagnosis present

## 2020-10-19 DIAGNOSIS — N4821 Abscess of corpus cavernosum and penis: Secondary | ICD-10-CM | POA: Diagnosis present

## 2020-10-19 DIAGNOSIS — E872 Acidosis: Secondary | ICD-10-CM | POA: Diagnosis not present

## 2020-10-19 DIAGNOSIS — F17210 Nicotine dependence, cigarettes, uncomplicated: Secondary | ICD-10-CM | POA: Diagnosis present

## 2020-10-19 DIAGNOSIS — Z79899 Other long term (current) drug therapy: Secondary | ICD-10-CM

## 2020-10-19 DIAGNOSIS — Z9079 Acquired absence of other genital organ(s): Secondary | ICD-10-CM

## 2020-10-19 DIAGNOSIS — K6289 Other specified diseases of anus and rectum: Secondary | ICD-10-CM | POA: Diagnosis present

## 2020-10-19 DIAGNOSIS — A409 Streptococcal sepsis, unspecified: Principal | ICD-10-CM | POA: Diagnosis present

## 2020-10-19 DIAGNOSIS — N4281 Prostatodynia syndrome: Secondary | ICD-10-CM | POA: Diagnosis present

## 2020-10-19 DIAGNOSIS — R61 Generalized hyperhidrosis: Secondary | ICD-10-CM | POA: Diagnosis present

## 2020-10-19 DIAGNOSIS — Z20822 Contact with and (suspected) exposure to covid-19: Secondary | ICD-10-CM | POA: Diagnosis present

## 2020-10-19 DIAGNOSIS — L02215 Cutaneous abscess of perineum: Secondary | ICD-10-CM | POA: Diagnosis present

## 2020-10-19 NOTE — ED Triage Notes (Addendum)
Pt arriving with complaint of peri-rectal abscess. Pt had surgery for same in October and states he feels like it is back and is having severe pain. Pt seen for same yesterday.

## 2020-10-20 ENCOUNTER — Inpatient Hospital Stay (HOSPITAL_COMMUNITY)
Admission: EM | Admit: 2020-10-20 | Discharge: 2020-11-01 | DRG: 854 | Disposition: A | Discharge status: Home / Self Care | Payer: Self-pay | Attending: Internal Medicine | Admitting: Internal Medicine

## 2020-10-20 ENCOUNTER — Inpatient Hospital Stay (HOSPITAL_COMMUNITY): Payer: Self-pay | Admitting: Certified Registered Nurse Anesthetist

## 2020-10-20 ENCOUNTER — Emergency Department (HOSPITAL_COMMUNITY): Payer: Self-pay

## 2020-10-20 ENCOUNTER — Encounter (HOSPITAL_COMMUNITY): Payer: Self-pay

## 2020-10-20 ENCOUNTER — Encounter (HOSPITAL_COMMUNITY)
Admission: EM | Disposition: A | Discharge status: Home / Self Care | Payer: Self-pay | Source: Home / Self Care | Attending: Internal Medicine

## 2020-10-20 ENCOUNTER — Inpatient Hospital Stay: Admit: 2020-10-20 | Payer: MEDICAID | Admitting: Urology

## 2020-10-20 ENCOUNTER — Other Ambulatory Visit: Payer: Self-pay | Admitting: Urology

## 2020-10-20 DIAGNOSIS — L02215 Cutaneous abscess of perineum: Secondary | ICD-10-CM

## 2020-10-20 DIAGNOSIS — A419 Sepsis, unspecified organism: Secondary | ICD-10-CM

## 2020-10-20 DIAGNOSIS — Z720 Tobacco use: Secondary | ICD-10-CM

## 2020-10-20 HISTORY — PX: IRRIGATION AND DEBRIDEMENT ABSCESS: SHX5252

## 2020-10-20 HISTORY — PX: TRANSURETHRAL RESECTION OF PROSTATE: SHX73

## 2020-10-20 HISTORY — DX: Anxiety disorder, unspecified: F41.9

## 2020-10-20 LAB — COMPREHENSIVE METABOLIC PANEL
ALT: 13 U/L (ref 0–44)
AST: 15 U/L (ref 15–41)
Albumin: 3.7 g/dL (ref 3.5–5.0)
Alkaline Phosphatase: 52 U/L (ref 38–126)
Anion gap: 15 (ref 5–15)
BUN: 8 mg/dL (ref 6–20)
CO2: 22 mmol/L (ref 22–32)
Calcium: 9.2 mg/dL (ref 8.9–10.3)
Chloride: 94 mmol/L — ABNORMAL LOW (ref 98–111)
Creatinine, Ser: 0.94 mg/dL (ref 0.61–1.24)
GFR, Estimated: >60 mL/min (ref >60)
Glucose, Bld: 152 mg/dL — ABNORMAL HIGH (ref 70–99)
Potassium: 3.8 mmol/L (ref 3.5–5.1)
Sodium: 131 mmol/L — ABNORMAL LOW (ref 135–145)
Total Bilirubin: 0.6 mg/dL (ref 0.3–1.2)
Total Protein: 8.3 g/dL — ABNORMAL HIGH (ref 6.5–8.1)

## 2020-10-20 LAB — SURGICAL PCR SCREEN
MRSA, PCR: NEGATIVE
Staphylococcus aureus: NEGATIVE

## 2020-10-20 LAB — CBC WITH DIFFERENTIAL/PLATELET
Abs Immature Granulocytes: 0.32 10*3/uL — ABNORMAL HIGH (ref 0.00–0.07)
Basophils Absolute: 0.1 10*3/uL (ref 0.0–0.1)
Basophils Relative: 0 %
Eosinophils Absolute: 0 10*3/uL (ref 0.0–0.5)
Eosinophils Relative: 0 %
HCT: 41.1 % (ref 39.0–52.0)
Hemoglobin: 13.6 g/dL (ref 13.0–17.0)
Immature Granulocytes: 1 %
Lymphocytes Relative: 7 %
Lymphs Abs: 1.7 10*3/uL (ref 0.7–4.0)
MCH: 26.8 pg (ref 26.0–34.0)
MCHC: 33.1 g/dL (ref 30.0–36.0)
MCV: 81.1 fL (ref 80.0–100.0)
Monocytes Absolute: 1.9 10*3/uL — ABNORMAL HIGH (ref 0.1–1.0)
Monocytes Relative: 7 %
Neutro Abs: 22 10*3/uL — ABNORMAL HIGH (ref 1.7–7.7)
Neutrophils Relative %: 85 %
Platelets: 273 10*3/uL (ref 150–400)
RBC: 5.07 MIL/uL (ref 4.22–5.81)
RDW: 14.8 % (ref 11.5–15.5)
WBC: 26 10*3/uL — ABNORMAL HIGH (ref 4.0–10.5)
nRBC: 0 % (ref 0.0–0.2)

## 2020-10-20 LAB — URINALYSIS, ROUTINE W REFLEX MICROSCOPIC
Bilirubin Urine: NEGATIVE
Glucose, UA: NEGATIVE mg/dL
Ketones, ur: NEGATIVE mg/dL
Nitrite: NEGATIVE
Protein, ur: 100 mg/dL — AB
Specific Gravity, Urine: >1.03 — ABNORMAL HIGH (ref 1.005–1.030)
pH: 5.5 (ref 5.0–8.0)

## 2020-10-20 LAB — URINALYSIS, MICROSCOPIC (REFLEX): WBC, UA: >50 WBC/hpf (ref 0–5)

## 2020-10-20 LAB — LACTIC ACID, PLASMA
Lactic Acid, Venous: 1.7 mmol/L (ref 0.5–1.9)
Lactic Acid, Venous: 0.9 mmol/L (ref 0.5–1.9)
Lactic Acid, Venous: 2.5 mmol/L (ref 0.5–1.9)

## 2020-10-20 LAB — RESP PANEL BY RT-PCR (FLU A&B, COVID) ARPGX2
Influenza A by PCR: NEGATIVE
Influenza B by PCR: NEGATIVE
SARS Coronavirus 2 by RT PCR: NEGATIVE

## 2020-10-20 LAB — PROTIME-INR
INR: 1.2 (ref 0.8–1.2)
Prothrombin Time: 14.6 seconds (ref 11.4–15.2)

## 2020-10-20 LAB — POC SARS CORONAVIRUS 2 AG -  ED: SARS Coronavirus 2 Ag: NEGATIVE

## 2020-10-20 LAB — APTT: aPTT: 38 seconds — ABNORMAL HIGH (ref 24–36)

## 2020-10-20 SURGERY — TURP (TRANSURETHRAL RESECTION OF PROSTATE)
Anesthesia: General

## 2020-10-20 MED ORDER — CHLORHEXIDINE GLUCONATE 0.12 % MT SOLN
15.0000 mL | OROMUCOSAL | Status: AC
  Administered 2020-10-20: 15 mL via OROMUCOSAL

## 2020-10-20 MED ORDER — SUGAMMADEX SODIUM 200 MG/2ML IV SOLN
INTRAVENOUS | Status: DC | PRN
  Administered 2020-10-20: 300 mg via INTRAVENOUS

## 2020-10-20 MED ORDER — SODIUM CHLORIDE 0.9 % IV SOLN
2.0000 g | INTRAVENOUS | Status: AC
  Administered 2020-10-20: 2 g via INTRAVENOUS
  Filled 2020-10-20: qty 2

## 2020-10-20 MED ORDER — LACTATED RINGERS IV SOLN: Freq: Once | INTRAVENOUS | Status: AC

## 2020-10-20 MED ORDER — PROMETHAZINE HCL 25 MG/ML IJ SOLN: 6.2500 mg | INTRAMUSCULAR | Status: DC | PRN

## 2020-10-20 MED ORDER — ACETAMINOPHEN 10 MG/ML IV SOLN: 1000.0000 mg | Freq: Once | INTRAVENOUS | Status: DC

## 2020-10-20 MED ORDER — ACETAMINOPHEN 500 MG PO TABS
1000.0000 mg | ORAL_TABLET | ORAL | Status: AC
  Administered 2020-10-20: 1000 mg via ORAL
  Filled 2020-10-20: qty 2

## 2020-10-20 MED ORDER — ACETAMINOPHEN 500 MG PO TABS
1000.0000 mg | ORAL_TABLET | Freq: Once | ORAL | Status: AC
  Administered 2020-10-20: 1000 mg via ORAL
  Filled 2020-10-20: qty 2

## 2020-10-20 MED ORDER — METRONIDAZOLE IN NACL 5-0.79 MG/ML-% IV SOLN
500.0000 mg | Freq: Once | INTRAVENOUS | Status: AC
  Administered 2020-10-20: 500 mg via INTRAVENOUS
  Filled 2020-10-20: qty 100

## 2020-10-20 MED ORDER — PROPOFOL 10 MG/ML IV BOLUS
INTRAVENOUS | Status: AC
  Filled 2020-10-20: qty 20

## 2020-10-20 MED ORDER — LACTATED RINGERS IV BOLUS
1000.0000 mL | Freq: Once | INTRAVENOUS | Status: AC
  Administered 2020-10-20: 1000 mL via INTRAVENOUS

## 2020-10-20 MED ORDER — MORPHINE SULFATE (PF) 2 MG/ML IV SOLN
2.0000 mg | INTRAVENOUS | Status: DC | PRN
  Administered 2020-10-20 – 2020-10-24 (×20): 2 mg via INTRAVENOUS
  Filled 2020-10-20 (×20): qty 1

## 2020-10-20 MED ORDER — LACTATED RINGERS IV SOLN: INTRAVENOUS | Status: DC

## 2020-10-20 MED ORDER — ESMOLOL HCL 100 MG/10ML IV SOLN
INTRAVENOUS | Status: AC
  Filled 2020-10-20: qty 10

## 2020-10-20 MED ORDER — SUCCINYLCHOLINE CHLORIDE 200 MG/10ML IV SOSY
PREFILLED_SYRINGE | INTRAVENOUS | Status: DC | PRN
  Administered 2020-10-20: 160 mg via INTRAVENOUS

## 2020-10-20 MED ORDER — FENTANYL CITRATE (PF) 100 MCG/2ML IJ SOLN
25.0000 ug | INTRAMUSCULAR | Status: DC | PRN
  Administered 2020-10-20: 50 ug via INTRAVENOUS

## 2020-10-20 MED ORDER — LACTATED RINGERS IV BOLUS
500.0000 mL | Freq: Once | INTRAVENOUS | Status: AC
  Administered 2020-10-20: 500 mL via INTRAVENOUS

## 2020-10-20 MED ORDER — MIDAZOLAM HCL 2 MG/2ML IJ SOLN
INTRAMUSCULAR | Status: AC
  Filled 2020-10-20: qty 2

## 2020-10-20 MED ORDER — VANCOMYCIN HCL 500 MG/100ML IV SOLN
500.0000 mg | Freq: Once | INTRAVENOUS | Status: AC
  Administered 2020-10-20: 500 mg via INTRAVENOUS
  Filled 2020-10-20 (×2): qty 100

## 2020-10-20 MED ORDER — FENTANYL CITRATE (PF) 250 MCG/5ML IJ SOLN
INTRAMUSCULAR | Status: AC
  Filled 2020-10-20: qty 5

## 2020-10-20 MED ORDER — OXYCODONE HCL 5 MG PO TABS: 5.0000 mg | ORAL_TABLET | Freq: Once | ORAL | Status: DC | PRN

## 2020-10-20 MED ORDER — HEPARIN SODIUM (PORCINE) 5000 UNIT/ML IJ SOLN
5000.0000 [IU] | Freq: Three times a day (TID) | INTRAMUSCULAR | Status: DC
  Administered 2020-10-20: 5000 [IU] via SUBCUTANEOUS
  Filled 2020-10-20 (×2): qty 1

## 2020-10-20 MED ORDER — SODIUM CHLORIDE 0.9 % IR SOLN
Status: DC | PRN
  Administered 2020-10-20: 3000 mL

## 2020-10-20 MED ORDER — VANCOMYCIN HCL 1500 MG/300ML IV SOLN
1500.0000 mg | Freq: Two times a day (BID) | INTRAVENOUS | Status: DC
  Administered 2020-10-20 – 2020-10-25 (×10): 1500 mg via INTRAVENOUS
  Filled 2020-10-20 (×10): qty 300

## 2020-10-20 MED ORDER — CHLORHEXIDINE GLUCONATE CLOTH 2 % EX PADS
6.0000 | MEDICATED_PAD | Freq: Every day | CUTANEOUS | Status: DC
  Administered 2020-10-20 – 2020-10-22 (×3): 6 via TOPICAL

## 2020-10-20 MED ORDER — FENTANYL CITRATE (PF) 100 MCG/2ML IJ SOLN
INTRAMUSCULAR | Status: AC
  Filled 2020-10-20: qty 2

## 2020-10-20 MED ORDER — ROCURONIUM BROMIDE 10 MG/ML (PF) SYRINGE
PREFILLED_SYRINGE | INTRAVENOUS | Status: DC | PRN
  Administered 2020-10-20: 10 mg via INTRAVENOUS
  Administered 2020-10-20: 20 mg via INTRAVENOUS
  Administered 2020-10-20: 30 mg via INTRAVENOUS

## 2020-10-20 MED ORDER — MIDAZOLAM HCL 5 MG/5ML IJ SOLN
INTRAMUSCULAR | Status: DC | PRN
  Administered 2020-10-20: 2 mg via INTRAVENOUS

## 2020-10-20 MED ORDER — ONDANSETRON HCL 4 MG/2ML IJ SOLN: 4.0000 mg | Freq: Four times a day (QID) | INTRAMUSCULAR | Status: DC | PRN

## 2020-10-20 MED ORDER — ACETAMINOPHEN 325 MG PO TABS
650.0000 mg | ORAL_TABLET | Freq: Once | ORAL | Status: DC | PRN
  Filled 2020-10-20: qty 2

## 2020-10-20 MED ORDER — NICOTINE 21 MG/24HR TD PT24
21.0000 mg | MEDICATED_PATCH | Freq: Every day | TRANSDERMAL | Status: DC
  Administered 2020-10-20 – 2020-11-01 (×12): 21 mg via TRANSDERMAL
  Filled 2020-10-20 (×13): qty 1

## 2020-10-20 MED ORDER — FENTANYL CITRATE (PF) 100 MCG/2ML IJ SOLN
INTRAMUSCULAR | Status: DC | PRN
  Administered 2020-10-20 (×5): 50 ug via INTRAVENOUS

## 2020-10-20 MED ORDER — ONDANSETRON HCL 4 MG/2ML IJ SOLN
INTRAMUSCULAR | Status: DC | PRN
  Administered 2020-10-20: 4 mg via INTRAVENOUS

## 2020-10-20 MED ORDER — LIDOCAINE HCL (PF) 2 % IJ SOLN
INTRAMUSCULAR | Status: AC
  Filled 2020-10-20: qty 5

## 2020-10-20 MED ORDER — OXYCODONE HCL 5 MG/5ML PO SOLN: 5.0000 mg | Freq: Once | ORAL | Status: DC | PRN

## 2020-10-20 MED ORDER — PROPOFOL 10 MG/ML IV BOLUS
INTRAVENOUS | Status: DC | PRN
  Administered 2020-10-20: 200 mg via INTRAVENOUS

## 2020-10-20 MED ORDER — VANCOMYCIN HCL 1500 MG/300ML IV SOLN
1500.0000 mg | INTRAVENOUS | Status: AC
  Administered 2020-10-20: 1500 mg via INTRAVENOUS
  Filled 2020-10-20: qty 300

## 2020-10-20 MED ORDER — IOHEXOL 300 MG/ML  SOLN
100.0000 mL | Freq: Once | INTRAMUSCULAR | Status: AC | PRN
  Administered 2020-10-20: 100 mL via INTRAVENOUS

## 2020-10-20 MED ORDER — LACTATED RINGERS IV BOLUS (SEPSIS)
1000.0000 mL | Freq: Once | INTRAVENOUS | Status: AC
  Administered 2020-10-28: 1000 mL via INTRAVENOUS

## 2020-10-20 MED ORDER — LACTATED RINGERS IV SOLN: INTRAVENOUS | Status: AC

## 2020-10-20 MED ORDER — ESMOLOL HCL 100 MG/10ML IV SOLN
INTRAVENOUS | Status: DC | PRN
  Administered 2020-10-20: 40 mg via INTRAVENOUS
  Administered 2020-10-20: 10 mg via INTRAVENOUS

## 2020-10-20 MED ORDER — ONDANSETRON HCL 4 MG/2ML IJ SOLN
4.0000 mg | Freq: Once | INTRAMUSCULAR | Status: AC
  Administered 2020-10-20: 4 mg via INTRAVENOUS
  Filled 2020-10-20: qty 2

## 2020-10-20 MED ORDER — PIPERACILLIN-TAZOBACTAM 3.375 G IVPB
3.3750 g | Freq: Three times a day (TID) | INTRAVENOUS | Status: DC
  Administered 2020-10-20 – 2020-10-26 (×18): 3.375 g via INTRAVENOUS
  Filled 2020-10-20 (×19): qty 50

## 2020-10-20 MED ORDER — POLYETHYLENE GLYCOL 3350 17 G PO PACK
17.0000 g | PACK | Freq: Every day | ORAL | Status: DC | PRN
  Administered 2020-10-20: 17 g via ORAL
  Filled 2020-10-20: qty 1

## 2020-10-20 MED ORDER — MORPHINE SULFATE (PF) 4 MG/ML IV SOLN
4.0000 mg | Freq: Once | INTRAVENOUS | Status: AC
  Administered 2020-10-20: 4 mg via INTRAVENOUS
  Filled 2020-10-20: qty 1

## 2020-10-20 MED ORDER — ONDANSETRON HCL 4 MG/2ML IJ SOLN
INTRAMUSCULAR | Status: AC
  Filled 2020-10-20: qty 2

## 2020-10-20 MED ORDER — LIDOCAINE 2% (20 MG/ML) 5 ML SYRINGE
INTRAMUSCULAR | Status: DC | PRN
  Administered 2020-10-20: 80 mg via INTRAVENOUS

## 2020-10-20 MED ORDER — ONDANSETRON HCL 4 MG PO TABS: 4.0000 mg | ORAL_TABLET | Freq: Four times a day (QID) | ORAL | Status: DC | PRN

## 2020-10-20 MED ORDER — DEXAMETHASONE SODIUM PHOSPHATE 10 MG/ML IJ SOLN
INTRAMUSCULAR | Status: DC | PRN
  Administered 2020-10-20: 8 mg via INTRAVENOUS

## 2020-10-20 MED ORDER — SODIUM CHLORIDE 0.9 % IR SOLN
3000.0000 mL | Status: DC
  Administered 2020-10-20: 3000 mL

## 2020-10-20 MED ORDER — DEXAMETHASONE SODIUM PHOSPHATE 10 MG/ML IJ SOLN
INTRAMUSCULAR | Status: AC
  Filled 2020-10-20: qty 1

## 2020-10-20 MED ORDER — VANCOMYCIN HCL 1500 MG/300ML IV SOLN: 1500.0000 mg | Freq: Two times a day (BID) | INTRAVENOUS | Status: DC

## 2020-10-20 SURGICAL SUPPLY — 50 items
BAG URINE DRAIN 2000ML AR STRL (UROLOGICAL SUPPLIES) ×2 IMPLANT
BAG URO CATCHER STRL LF (MISCELLANEOUS) ×2 IMPLANT
BENZOIN TINCTURE PRP APPL 2/3 (GAUZE/BANDAGES/DRESSINGS) ×2 IMPLANT
BLADE HEX COATED 2.75 (ELECTRODE) ×2 IMPLANT
BLADE SURG 15 STRL LF DISP TIS (BLADE) ×1 IMPLANT
BLADE SURG 15 STRL SS (BLADE) ×1
BNDG GAUZE ELAST 4 BULKY (GAUZE/BANDAGES/DRESSINGS) ×2 IMPLANT
CATH FOLEY 3WAY 30CC 22FR (CATHETERS) ×2 IMPLANT
CATH FOLEY 3WAY 30CC 24FR (CATHETERS)
CATH URTH STD 24FR FL 3W 2 (CATHETERS) IMPLANT
COVER WAND RF STERILE (DRAPES) IMPLANT
DERMABOND ADVANCED (GAUZE/BANDAGES/DRESSINGS) ×1
DERMABOND ADVANCED .7 DNX12 (GAUZE/BANDAGES/DRESSINGS) ×1 IMPLANT
DRAIN PENROSE 0.25X18 (DRAIN) ×2 IMPLANT
DRAIN PENROSE 0.5X18 (DRAIN) ×2 IMPLANT
DRAPE LAPAROTOMY T 98X78 PEDS (DRAPES) ×2 IMPLANT
DRSG PAD ABDOMINAL 8X10 ST (GAUZE/BANDAGES/DRESSINGS) ×2 IMPLANT
ELECT REM PT RETURN 15FT ADLT (MISCELLANEOUS) ×2 IMPLANT
GAUZE SPONGE 4X4 12PLY STRL (GAUZE/BANDAGES/DRESSINGS) ×2 IMPLANT
GLOVE SURG ENC TEXT LTX SZ7.5 (GLOVE) ×2 IMPLANT
GOWN STRL REUS W/TWL LRG LVL3 (GOWN DISPOSABLE) ×4 IMPLANT
GUIDEWIRE STR DUAL SENSOR (WIRE) ×2 IMPLANT
HOLDER FOLEY CATH W/STRAP (MISCELLANEOUS) IMPLANT
IV CATH 14GX2 1/4 (CATHETERS) ×2 IMPLANT
KIT BASIN OR (CUSTOM PROCEDURE TRAY) ×2 IMPLANT
KIT TURNOVER KIT A (KITS) IMPLANT
LOOP CUT BIPOLAR 24F LRG (ELECTROSURGICAL) IMPLANT
MANIFOLD NEPTUNE II (INSTRUMENTS) ×2 IMPLANT
NEEDLE HYPO 22GX1.5 SAFETY (NEEDLE) IMPLANT
NS IRRIG 1000ML POUR BTL (IV SOLUTION) IMPLANT
PACK BASIC VI WITH GOWN DISP (CUSTOM PROCEDURE TRAY) ×2 IMPLANT
PACK CYSTO (CUSTOM PROCEDURE TRAY) ×2 IMPLANT
PENCIL SMOKE EVAC W/HOLSTER (ELECTROSURGICAL) ×2 IMPLANT
PENCIL SMOKE EVACUATOR (MISCELLANEOUS) IMPLANT
SPONGE LAP 4X18 RFD (DISPOSABLE) ×2 IMPLANT
SUPPORT SCROTAL LG STRP (MISCELLANEOUS) ×2 IMPLANT
SUT MNCRL AB 4-0 PS2 18 (SUTURE) ×2 IMPLANT
SUT SILK 0 (SUTURE) ×1
SUT SILK 0 30XBRD TIE 6 (SUTURE) ×1 IMPLANT
SUT VIC AB 3-0 SH 27 (SUTURE) ×1
SUT VIC AB 3-0 SH 27XBRD (SUTURE) ×1 IMPLANT
SYR 20ML LL LF (SYRINGE) ×2 IMPLANT
SYR 30ML LL (SYRINGE) ×2 IMPLANT
SYR CONTROL 10ML LL (SYRINGE) IMPLANT
SYR TOOMEY IRRIG 70ML (MISCELLANEOUS) ×2
SYRINGE TOOMEY IRRIG 70ML (MISCELLANEOUS) ×1 IMPLANT
TUBING CONNECTING 10 (TUBING) ×2 IMPLANT
TUBING UROLOGY SET (TUBING) ×2 IMPLANT
WATER STERILE IRR 1000ML POUR (IV SOLUTION) IMPLANT
YANKAUER SUCT BULB TIP 10FT TU (MISCELLANEOUS) ×2 IMPLANT

## 2020-10-20 NOTE — Anesthesia Preprocedure Evaluation (Addendum)
Anesthesia Evaluation  Patient identified by MRN, date of birth, ID band Patient awake    Reviewed: Allergy & Precautions, NPO status , Patient's Chart, lab work & pertinent test results  History of Anesthesia Complications Negative for: history of anesthetic complications  Airway Mallampati: I       Dental no notable dental hx.    Pulmonary Current Smoker and Patient abstained from smoking.,    Pulmonary exam normal        Cardiovascular negative cardio ROS Normal cardiovascular exam     Neuro/Psych negative neurological ROS  negative psych ROS   GI/Hepatic negative GI ROS, Neg liver ROS,   Endo/Other  negative endocrine ROS  Renal/GU Renal disease   recurrent prostate and perineal abcess    Musculoskeletal negative musculoskeletal ROS (+)   Abdominal Normal abdominal exam  (+)   Peds  Hematology negative hematology ROS (+)   Anesthesia Other Findings Day of surgery medications reviewed with patient.  Reproductive/Obstetrics negative OB ROS                            Anesthesia Physical Anesthesia Plan  ASA: II and emergent  Anesthesia Plan: General   Post-op Pain Management:    Induction: Intravenous  PONV Risk Score and Plan: 3 and Treatment may vary due to age or medical condition, Midazolam, Ondansetron and Dexamethasone  Airway Management Planned: Oral ETT and LMA  Additional Equipment: None  Intra-op Plan:   Post-operative Plan: Extubation in OR  Informed Consent: I have reviewed the patients History and Physical, chart, labs and discussed the procedure including the risks, benefits and alternatives for the proposed anesthesia with the patient or authorized representative who has indicated his/her understanding and acceptance.     Dental advisory given  Plan Discussed with: CRNA  Anesthesia Plan Comments:       Anesthesia Quick Evaluation                                   Anesthesia Evaluation  Patient identified by MRN, date of birth, ID band Patient awake    Reviewed: Allergy & Precautions, NPO status , Patient's Chart, lab work & pertinent test results  History of Anesthesia Complications Negative for: history of anesthetic complications  Airway Mallampati: II  TM Distance: >3 FB Neck ROM: Full    Dental   Pulmonary Current Smoker,    Pulmonary exam normal        Cardiovascular negative cardio ROS Normal cardiovascular exam     Neuro/Psych negative neurological ROS  negative psych ROS   GI/Hepatic negative GI ROS, Neg liver ROS,   Endo/Other  negative endocrine ROS  Renal/GU negative Renal ROS   Perineal/prostate abscess    Musculoskeletal negative musculoskeletal ROS (+)   Abdominal   Peds  Hematology negative hematology ROS (+)   Anesthesia Other Findings   Reproductive/Obstetrics                            Anesthesia Physical Anesthesia Plan  ASA: III and emergent  Anesthesia Plan: General   Post-op Pain Management:    Induction: Intravenous and Rapid sequence  PONV Risk Score and Plan: 2 and Ondansetron, Dexamethasone, Treatment may vary due to age or medical condition and Midazolam  Airway Management Planned: Oral ETT  Additional Equipment: None  Intra-op Plan:  Post-operative Plan: Extubation in OR  Informed Consent: I have reviewed the patients History and Physical, chart, labs and discussed the procedure including the risks, benefits and alternatives for the proposed anesthesia with the patient or authorized representative who has indicated his/her understanding and acceptance.     Dental advisory given  Plan Discussed with:   Anesthesia Plan Comments:        Anesthesia Quick Evaluation

## 2020-10-20 NOTE — Progress Notes (Signed)
A consult was received from an ED physician for Vancomycin and Cefepime per pharmacy dosing.  The patient's profile has been reviewed for ht/wt/allergies/indication/available labs.    A one time order has been placed for Vancomycin 1500mg  and Cefepime 2gm IV x 1 dose each.    Further antibiotics/pharmacy consults should be ordered by admitting physician if indicated.                       Thank you, , PharmD 10/20/2020  6:10 AM

## 2020-10-20 NOTE — Anesthesia Postprocedure Evaluation (Signed)
Anesthesia Post Note  Patient: Tyler [name]  Procedure(s) Performed: CYSTOSCOPY, TRANSURETHRAL RESECTION OF THE PROSTATE ABCESS(TURP) (N/A ) IRRIGATION AND DEBRIDEMENT PERINEAL  ABSCESS (N/A )     Patient location during evaluation: PACU Anesthesia Type: General Level of consciousness: awake and sedated Pain management: pain level controlled Vital Signs Assessment: vitals unstable Respiratory status: spontaneous breathing Cardiovascular status: tachycardic Postop Assessment: no apparent nausea or vomiting Anesthetic complications: no Comments: Going to progressive care unit with Telemetry because he is septic.   No complications documented.  Last Vitals:  Vitals:   10/20/20 1745 10/20/20 1800  BP: (!) 153/99 (!) 162/95  Pulse: (!) 127 (!) 127  Resp: (!) 32 (!) 26  Temp:    SpO2: 100% 93%    Last Pain:  Vitals:   10/20/20 1459  TempSrc:   PainSc: 9                  John F [address]

## 2020-10-20 NOTE — Progress Notes (Signed)
Pharmacy Antibiotic Note  Tyler [name] is a 35 y.o. male admitted on 10/20/2020 with perineal abscess. Pharmacy has been consulted for vancomycin and Zosyn dosing.  Admitted 10/4-10/8 for prostate abscess s/p unsuccessful transurethral unroofing followed by transrectal drain placement with cultures growing S anginosus; discharged on Levaquin. Subsequently had recurrence of pain and swelling and was hospitalized from 10/18-10/25 for sepsis secondary to perineal/right scrotal abscess at which time he underwent cystourethroscopy and I&D 10/19 and discharged with Augmentin to complete 3 wks abx.  Plan:  Vancomycin 1500 + 500 mg IV now, then 1500 mg IV q12 hr (est AUC 473 based on SCr 0.94; Vd 0.72)  Measure vancomycin AUC at steady state as indicated  SCr daily while on vanc + Zosyn  Zosyn 3.375 g IV given once over 30 minutes, then every 8 hrs by 4-hr infusion     Temp (24hrs), Avg:100 F (37.8 C), Min:98.8 F (37.1 C), Max:101.8 F (38.8 C)  Recent Labs  Lab 10/18/20 1245 10/20/20 0420 10/20/20 0620 10/20/20 0855  WBC 19.4* 26.0*  --   --   CREATININE 1.11 0.94  --   --   LATICACIDVEN 1.3  --  1.7 0.9    CrCl cannot be calculated (Unknown ideal weight.).    No Known Allergies  Antimicrobials this admission: 1/4 Cefepime/Flagyl x 1  1/4 vanc >> 1/4 Zosyn >>   Dose adjustments this admission: n/a  Microbiology results: 1/4 BCx: sent 1/4 UCx: sent   Thank you for allowing pharmacy to be a part of this patient's care.  Usama Harkless A 10/20/2020 10:24 AM

## 2020-10-20 NOTE — Transfer of Care (Signed)
Immediate Anesthesia Transfer of Care Note  Patient: Tyler [name]  Procedure(s) Performed: CYSTOSCOPY, TRANSURETHRAL RESECTION OF THE PROSTATE ABCESS(TURP) (N/A ) IRRIGATION AND DEBRIDEMENT PERINEAL  ABSCESS (N/A )  Patient Location: PACU  Anesthesia Type:General  Level of Consciousness: awake, alert  and patient cooperative  Airway & Oxygen Therapy: Patient Spontanous Breathing and Patient connected to face mask oxygen  Post-op Assessment: Report given to RN and Post -op Vital signs reviewed and stable  Post vital signs: Reviewed and stable  Last Vitals:  Vitals Value Taken Time  BP 132/80 10/20/20 1741  Temp    Pulse 124 10/20/20 1742  Resp 28 10/20/20 1743  SpO2 97 % 10/20/20 1742  Vitals shown include unvalidated device data.  Last Pain:  Vitals:   10/20/20 1459  TempSrc:   PainSc: 9       Patients Stated Pain Goal: 4 (10/20/20 1223)  Complications: No complications documented.

## 2020-10-20 NOTE — Consult Note (Signed)
Reason for Consult: Recurrent prostatic,perineal,penile abscess  Referring Physician: Randel Pigg PA  Tyler [name] is an 35 y.o. male.   HPI:   1-  Recurrent prostatic, perineal, penile abscess - smoldering abscess initially managed 07/2020 with recurrence by imaging and exam today involving  base of penis, prostate. Prior CX polymicrobial (e.coli, actinomyces, strep) sens Vanc + Gent. HIV negative, no h/o diabetes or diverticulitis.   WBC 26k, C19 screen negative. Last meal 9 am.   Today "Tyler [name]" is seen for emergent evaluation of above.   History reviewed. No pertinent past medical history.  Past Surgical History:  Procedure Laterality Date  . IRRIGATION AND DEBRIDEMENT ABSCESS N/A 08/04/2020   Procedure: IRRIGATION AND DEBRIDEMENT PERINEAL ABSCESS, [place];  Surgeon: Irine Seal, MD;  Location: [facility];  Service: Urology;  Laterality: N/A;  . TRANSURETHRAL RESECTION OF PROSTATE  07/21/2020   Procedure: TRANSURETHRAL RESECTION OF THE PROSTATE (TURP);  Surgeon: Ceasar Mons, MD;  Location: [facility];  Service: Urology;;    History reviewed. No pertinent family history.  Social History:  reports that he has been smoking cigarettes. He has been smoking about 1.00 pack per day. He has never used smokeless tobacco. He reports current alcohol use. He reports current drug use. Drug: Marijuana.  Allergies: No Known Allergies  Medications: I have reviewed the patient's current medications.  Results for orders placed or performed during the hospital encounter of 10/20/20 (from the past 48 hour(s))  Comprehensive metabolic panel     Status: Abnormal   Collection Time: 10/20/20  4:20 AM  Result Value Ref Range   Sodium 131 (L) 135 - 145 mmol/L   Potassium 3.8 3.5 - 5.1 mmol/L   Chloride 94 (L) 98 - 111 mmol/L   CO2 22 22 - 32 mmol/L   Glucose, Bld 152 (H) 70 - 99 mg/dL    Comment: Glucose reference range applies only to samples taken after fasting for at least 8  hours.   BUN 8 6 - 20 mg/dL   Creatinine, Ser 0.94 0.61 - 1.24 mg/dL   Calcium 9.2 8.9 - 10.3 mg/dL   Total Protein 8.3 (H) 6.5 - 8.1 g/dL   Albumin 3.7 3.5 - 5.0 g/dL   AST 15 15 - 41 U/L   ALT 13 0 - 44 U/L   Alkaline Phosphatase 52 38 - 126 U/L   Total Bilirubin 0.6 0.3 - 1.2 mg/dL   GFR, Estimated >60 >60 mL/min    Comment: (NOTE) Calculated using the CKD-EPI Creatinine Equation (2021)    Anion gap 15 5 - 15    Comment: Performed at [facility], [place] [address] [place], [place] [address]  CBC with Differential     Status: Abnormal   Collection Time: 10/20/20  4:20 AM  Result Value Ref Range   WBC 26.0 (H) 4.0 - 10.5 K/uL   RBC 5.07 4.22 - 5.81 MIL/uL   Hemoglobin 13.6 13.0 - 17.0 g/dL   HCT 41.1 39.0 - 52.0 %   MCV 81.1 80.0 - 100.0 fL   MCH 26.8 26.0 - 34.0 pg   MCHC 33.1 30.0 - 36.0 g/dL   RDW 14.8 11.5 - 15.5 %   Platelets 273 150 - 400 K/uL   nRBC 0.0 0.0 - 0.2 %   Neutrophils Relative % 85 %   Neutro Abs 22.0 (H) 1.7 - 7.7 K/uL   Lymphocytes Relative 7 %   Lymphs Abs 1.7 0.7 - 4.0 K/uL   Monocytes Relative 7 %  Monocytes Absolute 1.9 (H) 0.1 - 1.0 K/uL   Eosinophils Relative 0 %   Eosinophils Absolute 0.0 0.0 - 0.5 K/uL   Basophils Relative 0 %   Basophils Absolute 0.1 0.0 - 0.1 K/uL   WBC Morphology VACUOLATED NEUTROPHILS    Immature Granulocytes 1 %   Abs Immature Granulocytes 0.32 (H) 0.00 - 0.07 K/uL   Reactive, Benign Lymphocytes PRESENT     Comment: Performed at [facility], [place] [address] [place], [place] [address]  POC SARS Coronavirus 2 Ag-ED - Nasal Swab (BD Veritor Kit)     Status: None   Collection Time: 10/20/20  4:46 AM  Result Value Ref Range   SARS Coronavirus 2 Ag NEGATIVE NEGATIVE    Comment: (NOTE) SARS-CoV-2 antigen NOT DETECTED.   Negative results are presumptive.  Negative results do not preclude SARS-CoV-2 infection and should not be used as the sole basis for treatment or other patient  management decisions, including infection  control decisions, particularly in the presence of clinical signs and  symptoms consistent with COVID-19, or in those who have been in contact with the virus.  Negative results must be combined with clinical observations, patient history, and epidemiological information. The expected result is Negative.  Fact Sheet for Patients: PodPark.tn  Fact Sheet for Healthcare Providers: GiftContent.is   This test is not yet approved or cleared by the [place] FDA and  has been authorized for detection and/or diagnosis of SARS-CoV-2 by FDA under an Emergency Use Authorization (EUA).  This EUA will remain in effect (meaning this test can be used) for the duration of  the C OVID-19 declaration under Section 564(b)(1) of the Act, 21 U.S.C. section 360bbb-3(b)(1), unless the authorization is terminated or revoked sooner.    Urinalysis, Routine w reflex microscopic Urine, Clean Catch     Status: Abnormal   Collection Time: 10/20/20  6:00 AM  Result Value Ref Range   Color, Urine YELLOW YELLOW   APPearance CLOUDY (A) CLEAR   Specific Gravity, Urine >1.030 (H) 1.005 - 1.030   pH 5.5 5.0 - 8.0   Glucose, UA NEGATIVE NEGATIVE mg/dL   Hgb urine dipstick MODERATE (A) NEGATIVE   Bilirubin Urine NEGATIVE NEGATIVE   Ketones, ur NEGATIVE NEGATIVE mg/dL   Protein, ur 100 (A) NEGATIVE mg/dL   Nitrite NEGATIVE NEGATIVE   Leukocytes,Ua MODERATE (A) NEGATIVE    Comment: Performed at [place] [address], [place], [place] [address]  Urinalysis, Microscopic (reflex)     Status: Abnormal   Collection Time: 10/20/20  6:00 AM  Result Value Ref Range   RBC / HPF 0-5 0 - 5 RBC/hpf   WBC, UA >50 0 - 5 WBC/hpf   Bacteria, UA MANY (A) NONE SEEN   Squamous Epithelial / LPF 0-5 0 - 5   Urine-Other MUCOUS PRESENT     Comment: Performed at [facility], [place]  [address], [place], [place] [address]  Resp Panel by RT-PCR (Flu A&B, Covid) Nasopharyngeal Swab     Status: None   Collection Time: 10/20/20  6:18 AM   Specimen: Nasopharyngeal Swab; Nasopharyngeal(NP) swabs in vial transport medium  Result Value Ref Range   SARS Coronavirus 2 by RT PCR NEGATIVE NEGATIVE    Comment: (NOTE) SARS-CoV-2 target nucleic acids are NOT DETECTED.  The SARS-CoV-2 RNA is generally detectable in upper respiratory specimens during the acute phase of infection. The lowest concentration of SARS-CoV-2 viral copies this assay can detect is 138 copies/mL.  A negative result does not preclude SARS-Cov-2 infection and should not be used as the sole basis for treatment or other patient management decisions. A negative result may occur with  improper specimen collection/handling, submission of specimen other than nasopharyngeal swab, presence of viral mutation(s) within the areas targeted by this assay, and inadequate number of viral copies(<138 copies/mL). A negative result must be combined with clinical observations, patient history, and epidemiological information. The expected result is Negative.  Fact Sheet for Patients:  EntrepreneurPulse.com.au  Fact Sheet for Healthcare Providers:  IncredibleEmployment.be  This test is no t yet approved or cleared by the [place] FDA and  has been authorized for detection and/or diagnosis of SARS-CoV-2 by FDA under an Emergency Use Authorization (EUA). This EUA will remain  in effect (meaning this test can be used) for the duration of the COVID-19 declaration under Section 564(b)(1) of the Act, 21 U.S.C.section 360bbb-3(b)(1), unless the authorization is terminated  or revoked sooner.       Influenza A by PCR NEGATIVE NEGATIVE   Influenza B by PCR NEGATIVE NEGATIVE    Comment: (NOTE) The Xpert Xpress SARS-CoV-2/FLU/RSV plus assay is intended as an aid in the diagnosis of influenza  from Nasopharyngeal swab specimens and should not be used as a sole basis for treatment. Nasal washings and aspirates are unacceptable for Xpert Xpress SARS-CoV-2/FLU/RSV testing.  Fact Sheet for Patients: EntrepreneurPulse.com.au  Fact Sheet for Healthcare Providers: IncredibleEmployment.be  This test is not yet approved or cleared by the [place] FDA and has been authorized for detection and/or diagnosis of SARS-CoV-2 by FDA under an Emergency Use Authorization (EUA). This EUA will remain in effect (meaning this test can be used) for the duration of the COVID-19 declaration under Section 564(b)(1) of the Act, 21 U.S.C. section 360bbb-3(b)(1), unless the authorization is terminated or revoked.  Performed at [facility], [place] [address], [place], [place] [address]   Lactic acid, plasma     Status: None   Collection Time: 10/20/20  6:20 AM  Result Value Ref Range   Lactic Acid, Venous 1.7 0.5 - 1.9 mmol/L    Comment: Performed at [facility], [place] [address], [place], [place] [address]  Protime-INR     Status: None   Collection Time: 10/20/20  6:20 AM  Result Value Ref Range   Prothrombin Time 14.6 11.4 - 15.2 seconds   INR 1.2 0.8 - 1.2    Comment: (NOTE) INR goal varies based on device and disease states. Performed at [facility], [place] [address] [place], [place] [address]   APTT     Status: Abnormal   Collection Time: 10/20/20  6:20 AM  Result Value Ref Range   aPTT 38 (H) 24 - 36 seconds    Comment:        IF BASELINE aPTT IS ELEVATED, SUGGEST PATIENT RISK ASSESSMENT BE USED TO DETERMINE APPROPRIATE ANTICOAGULANT THERAPY. Performed at [facility], [place] [address], [place], [place] [address]     CT ABDOMEN PELVIS W CONTRAST  Result Date: 10/20/2020 CLINICAL DATA:  Rectal pain, rectal abscess, leukocytosis EXAM: CT ABDOMEN AND PELVIS WITH CONTRAST  TECHNIQUE: Multidetector CT imaging of the abdomen and pelvis was performed using the standard protocol following bolus administration of intravenous contrast. CONTRAST:  138m OMNIPAQUE IOHEXOL 300 MG/ML  SOLN COMPARISON:  07/20/2020, 07/26/2020 FINDINGS: Lower chest: The visualized lung bases are clear bilaterally. The visualized heart and pericardium are unremarkable. Hepatobiliary: No focal liver abnormality is  seen. No gallstones, gallbladder wall thickening, or biliary dilatation. Pancreas: Unremarkable Spleen: Unremarkable Adrenals/Urinary Tract: The adrenal glands are unremarkable. The kidneys are normal. The bladder is largely decompressed. Previously noted bladder wall thickening has improved in the interval. There is a defect seen at the bladder base involving the prostatic urethra in keeping with the given history of trans urethral resection. Stomach/Bowel: The stomach, small bowel, and large bowel are unremarkable. No free intraperitoneal gas or fluid. Vascular/Lymphatic: There is shotty bilateral external iliac lymphadenopathy, likely reactive in nature. No frankly pathologic adenopathy within the abdomen and pelvis. The vascular structures are unremarkable. Reproductive: As noted above, a defect is seen within the prostate gland in keeping with the given history of trans urethral resection. Other: Just inferior to the prostate gland, surrounding the expected membranous urethra is a recurrent perirectal abscess measuring 3.8 x 4.4 cm at axial image # 80/2. This component above the levator ani appears separate from 2 additional components involving the crus of the corpus cavernosa bilaterally. On the right, this measures 2.0 x 6.6 cm on axial image # 87/2. On the left, this multiloculated collection is less well-defined measuring roughly 1.5 x 4.1 cm at axial image # 86/2. Musculoskeletal: No acute bone abnormality. IMPRESSION: Recurrent multiloculated perirectal abscesses demonstrating components both  above the levator ani as well as below the levator involving the crus of the corpus cavernosa bilaterally. Interval changes of trans urethral resection of the prostate with improved bladder wall thickening. Electronically Signed   By: Fidela Salisbury MD   On: 10/20/2020 06:46    Review of Systems  Constitutional: Positive for fatigue and fever.  Genitourinary: Positive for penile pain, scrotal swelling and urgency.  All other systems reviewed and are negative.  Blood pressure 139/87, pulse (!) 105, temperature 98.8 F (37.1 C), temperature source Oral, resp. rate 18, SpO2 99 %. Physical Exam Vitals reviewed.  Constitutional:      Comments: Pleasant and cooperative.   HENT:     Head: Normocephalic.  Eyes:     Pupils: Pupils are equal, round, and reactive to light.  Cardiovascular:     Rate and Rhythm: Normal rate.     Pulses: Normal pulses.  Pulmonary:     Effort: Pulmonary effort is normal.  Genitourinary:    Penis: Normal.      Comments: Very tender perineal swelling. Small pustule lower abdomen Musculoskeletal:        General: Normal range of motion.  Psychiatric:        Mood and Affect: Mood normal.     Assessment/Plan:  1-  Recurrent prostatic, perineal, penile abscess - Rec operative I+D perineal and cysto / TUR prostate abscess. Appreciate medical team comangement He will need likely local wound care for several weeks following. I still remain overall concerned about some sore of immune compromise as this kind of infeciton is VERY RARE in immune competant 32yo.   Risks, benefits, alternatives, expected peri-op course discussed.   Alexis Frock 10/20/2020, 8:53 AM

## 2020-10-20 NOTE — Sepsis Progress Note (Signed)
Monitoring for sepsis protocol. °

## 2020-10-20 NOTE — ED Provider Notes (Signed)
Pt's care assumed by me at 6:30 am.  Ct pending.  Ct scan shows recurrent multifocal perineal abscess.   I spoke with Urology who reports they will see this afternoon and to keep pt NPO.  Hospitalist consulted to admit    Tyler [name] 10/20/20 [name], MD 10/22/20 1038

## 2020-10-20 NOTE — Progress Notes (Signed)
CRITICAL VALUE ALERT  Critical Value:  Lactic 2.5  Date & Time Notied: 10/20/2020 @ 2250  Provider Notified: Yes  Orders Received/Actions taken: No order received.

## 2020-10-20 NOTE — ED Provider Notes (Signed)
Tyler [name]   CSN: [id number] Arrival date & time: 10/19/20  2022     History Chief Complaint  Patient presents with  . Abscess    Tyler [name] is a 35 y.o. male with PMH/o prostate abscess (Urology - Dr. Liliane Shi), perineal abscess (Urology Dr. Annabell Howells) who presents for evaluation of perineal and rectal pain x 3 days. About three days ago, he started noticing some pain in his perineal region. Over the next three days, this pain got progressively worse and spread to his testicles. He feels like his perineal area and right testicle is warm, erythematous. He states he has not noted any penile pain but has occasionally noticed some penile discharge. He is not currently sexually active.he states that he has been able to urinate but does report that it makes his perineal pain worse when doing so. He has not noted any hematuria. He has also noted some subjective fever/chills over the last few days. He reports that symptoms were worsening, prompting ED visit. 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 He originally had a prostate abscess operated on by urology and had a foley catheter placed. He then developed sepsis secondary to perineal abscess and had a subsequent operation. He last saw the urology doctors in November when he had his foley catheter removed. He had been doing well since then. Occasionally he would have some pain but states that it did not start getting bad until the last few days. He has not been vaccinated for COVID. No COVID exposure that he knows of. He denies any cough, congestion, rhinorrhea, nausea/vomiting, abdominal pain, CP, SOB. No history of diabetes.   The history is provided by the patient.       History reviewed. No pertinent past medical history.  Patient Active Problem List   Diagnosis Date Noted  . 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 Prostate abscess 07/21/2020  . Acute respiratory  failure with hypoxia (HCC)   . Sepsis (HCC)   . AKI (acute kidney injury) (HCC) 07/20/2020  . Abscess of prostate 07/20/2020    Past Surgical History:  Procedure Laterality Date  . IRRIGATION AND DEBRIDEMENT ABSCESS N/A 08/04/2020   Procedure: IRRIGATION AND DEBRIDEMENT PERINEAL ABSCESS, Lovett Sox;  Surgeon: Bjorn Pippin, MD;  Location: [facility];  Service: Urology;  Laterality: N/A;  . TRANSURETHRAL RESECTION OF PROSTATE  07/21/2020   Procedure: TRANSURETHRAL RESECTION OF THE PROSTATE (TURP);  Surgeon: Rene Paci, MD;  Location: [facility];  Service: Urology;;       History reviewed. No pertinent family history.  Social History   Tobacco Use  . Smoking status: Current Every Day Smoker    Packs/day: 1.00    Types: Cigarettes  . Smokeless tobacco: Never Used  Vaping Use  . Vaping Use: Never used  Substance Use Topics  . Alcohol use: Yes  . Drug use: Yes    Types: Marijuana    Home Medications Prior to Admission medications   Medication Sig Start Date End Date Taking? Authorizing Provider  acetaminophen (TYLENOL) 325 MG tablet Take 2 tablets (650 mg total) by mouth every 6 (six) hours as needed for mild pain or fever (or Fever >/= 101). 08/10/20   Hongalgi, Maximino Greenland, MD  oxyCODONE (OXY IR/ROXICODONE) 5 MG immediate release tablet Take 1 tablet (5 mg total) by mouth every 8 (eight) hours as needed for moderate pain or severe pain. 08/10/20   Hongalgi, Maximino Greenland, MD  polyethylene  glycol (MIRALAX / GLYCOLAX) 17 g packet Take 17 g by mouth 2 (two) times daily. 08/10/20   Hongalgi, Lenis Dickinson, MD  senna (SENOKOT) 8.6 MG TABS tablet Take 2 tablets (17.2 mg total) by mouth daily as needed for mild constipation or moderate constipation. 08/10/20   Hongalgi, Lenis Dickinson, MD  traZODone (DESYREL) 50 MG tablet Take 50 mg by mouth at bedtime. 07/05/20   [provider]    Allergies    Patient has no known allergies.  Review of Systems   Review of Systems   Constitutional: Positive for chills and fever.  Respiratory: Negative for cough and shortness of breath.   Cardiovascular: Negative for chest pain.  Gastrointestinal: Negative for abdominal pain, nausea and vomiting.  Genitourinary: Positive for difficulty urinating, scrotal swelling and testicular pain. Negative for dysuria and hematuria.  All other systems reviewed and are negative.   Physical Exam Updated Vital Signs BP (!) 138/95 (BP Location: Left Arm)   Pulse (!) 125   Temp (!) 101.8 F (38.8 C) (Oral)   Resp 20   SpO2 99%   Physical Exam Vitals and nursing [name] reviewed.  Constitutional:      Appearance: Normal appearance. He is well-developed and well-nourished.     Comments: Appears uncomfortable  HENT:     Head: Normocephalic and atraumatic.     Mouth/Throat:     Mouth: Oropharynx is clear and moist and mucous membranes are normal.  Eyes:     General: Lids are normal.     Extraocular Movements: EOM normal.     Conjunctiva/sclera: Conjunctivae normal.     Pupils: Pupils are equal, round, and reactive to light.  Cardiovascular:     Rate and Rhythm: Regular rhythm. Tachycardia present.     Pulses: Normal pulses.     Heart sounds: Normal heart sounds. No murmur heard. No friction rub. No gallop.   Pulmonary:     Effort: Pulmonary effort is normal.     Breath sounds: Normal breath sounds.     Comments: Lungs clear to auscultation bilaterally.  Symmetric chest rise.  No wheezing, rales, rhonchi. Abdominal:     Palpations: Abdomen is soft. Abdomen is not rigid.     Tenderness: There is no abdominal tenderness. There is no guarding.     Comments: Abdomen is soft, non-distended, non-tender. No rigidity, No guarding. No peritoneal signs.  Genitourinary:    Comments: The exam was performed with a chaperone present Horris Latino, [place]). Normal male genitalia. No evidence of rash, ulcers or lesions. Significant tenderness to palpation noted to the perineum that extends to the  base of the right testicle. No crepitus. No edema. Slightly warm to touch but no warmth. No penile discharge. No obvious swelling noted to the perianal area. Unable to complete rectal exam secondary to patient's discomfort.   Musculoskeletal:        General: Normal range of motion.     Cervical back: Full passive range of motion without pain.  Skin:    General: Skin is warm and dry.     Capillary Refill: Capillary refill takes less than 2 seconds.  Neurological:     Mental Status: He is alert and oriented to person, place, and time.  Psychiatric:        Mood and Affect: Mood and affect normal.        Speech: Speech normal.     ED Results / Procedures / Treatments   Labs (all labs ordered are listed, but only abnormal results  are displayed) Labs Reviewed  COMPREHENSIVE METABOLIC PANEL - Abnormal; Notable for the following components:      Result Value   Sodium 131 (*)    Chloride 94 (*)    Glucose, Bld 152 (*)    Total Protein 8.3 (*)    All other components within normal limits  CBC WITH DIFFERENTIAL/PLATELET - Abnormal; Notable for the following components:   WBC 26.0 (*)    Neutro Abs 22.0 (*)    Monocytes Absolute 1.9 (*)    Abs Immature Granulocytes 0.32 (*)    All other components within normal limits  URINALYSIS, ROUTINE W REFLEX MICROSCOPIC - Abnormal; Notable for the following components:   APPearance CLOUDY (*)    Specific Gravity, Urine >1.030 (*)    Hgb urine dipstick MODERATE (*)    Protein, ur 100 (*)    Leukocytes,Ua MODERATE (*)    All other components within normal limits  URINALYSIS, MICROSCOPIC (REFLEX) - Abnormal; Notable for the following components:   Bacteria, UA MANY (*)    All other components within normal limits  RESP PANEL BY RT-PCR (FLU A&B, COVID) ARPGX2  CULTURE, BLOOD (ROUTINE X 2)  CULTURE, BLOOD (ROUTINE X 2)  URINE CULTURE  LACTIC ACID, PLASMA  LACTIC ACID, PLASMA  PROTIME-INR  APTT  POC SARS CORONAVIRUS 2 AG -  ED  GC/CHLAMYDIA  PROBE AMP ([place]) NOT AT [facility]    EKG None  Radiology No results found.  Procedures .Critical Care Performed by: Maxwell Caul, PA-C Authorized by: Maxwell Caul, PA-C   Critical care provider statement:    Critical care time (minutes):  35   Critical care was necessary to treat or prevent imminent or life-threatening deterioration of the following conditions:  Sepsis   Critical care was time spent personally by me on the following activities:  Discussions with consultants, evaluation of patient's response to treatment, examination of patient, ordering and performing treatments and interventions, ordering and review of laboratory studies, ordering and review of radiographic studies, pulse oximetry, re-evaluation of patient's condition, obtaining history from patient or surrogate and review of old charts   (including critical care time)  Medications Ordered in ED Medications  acetaminophen (TYLENOL) tablet 1,000 mg (has no administration in time range)  lactated ringers infusion (has no administration in time range)  metroNIDAZOLE (FLAGYL) IVPB 500 mg (has no administration in time range)  morphine 4 MG/ML injection 4 mg (has no administration in time range)  ondansetron (ZOFRAN) injection 4 mg (has no administration in time range)  ceFEPIme (MAXIPIME) 2 g in sodium chloride 0.9 % 100 mL IVPB (has no administration in time range)  vancomycin (VANCOREADY) IVPB 1500 mg/300 mL (1,500 mg Intravenous New Bag/Given 10/20/20 0637)  lactated ringers bolus 1,000 mL (1,000 mLs Intravenous New Bag/Given 10/20/20 0615)  iohexol (OMNIPAQUE) 300 MG/ML solution 100 mL (100 mLs Intravenous Contrast Given 10/20/20 1448)    ED Course  I have reviewed the triage vital signs and the nursing notes.  Pertinent labs & imaging results that were available during my care of the patient were reviewed by me and considered in my medical decision making (see chart for details).    MDM  Rules/Calculators/A&P                          35 y.o. M with PMH/o prostate abscess, perineal abscess who presents for evaluation of 3 days of perineal pain, testicular pain, subjective fever/chills. H/o perineal abscess in  October 2021 that required surgical intervention. On initial ED arrival, patient is febrile, tachycardic and hypertensive. On exam, he appears uncomfortable. He has tenderness noted to the perineal region that extends to the right testicle. Unable to complete rectal exam. No crepitus noted. No history of DM. Concern for recurrent perineal/prostate abscess vs perirectal abscess. Also consider possible Fournier's. Labs ordered.   CBC shows leukocytosis of 26.0. CMP shows sodium of 131. BUN and Cr within normal limits. UA shows moderate Hgb, moderate leuks. Covid antigen is negative. Code sepsis initiated. Patient started on broad spectrum antibiotics (Vanc, cefepime, flagyl).  Will obtain imaging.   Patient signed out to Langston Masker, [employer].   Portions of this [name] were generated with Scientist, clinical (histocompatibility and immunogenetics). Dictation errors may occur despite best attempts at proofreading.  Final Clinical Impression(s) / ED Diagnoses Final diagnoses:  Sepsis, due to unspecified organism, unspecified whether acute organ dysfunction present [facility])    Rx / DC Orders ED Discharge Orders    None       Rosana Hoes 10/20/20 0640    Nira Conn, MD 10/22/20 [phone]

## 2020-10-20 NOTE — H&P (Signed)
History and Physical        Hospital Admission Note Date: 10/20/2020  Patient name: Tyler [name] Medical record number: [id number] Date of birth: 22-Jan-1986 Age: 35 y.o. Gender: male  PCP: Patient, No Pcp Per    Chief Complaint    Chief Complaint  Patient presents with  . Abscess      HPI:   This is a 35 year old male who has not been vaccinated against COVID-19 with a past medical history of recurrent admissions for sepsis secondary to prostate abscess who presents to the ED with groin and rectal pain for several days with concerns for recurrence of perirectal abscess. States he was doing well since his last procedure in October until [place] when he starting having intermittent perineal discomfort. This worsened and for the past several days has become constant. Also radiating to right testicle with associated purulent penile discharge, chills, night sweats.  No gross hematuria. Has pain with standing and sitting. Has been taking Ibuprofen for fever which has been up to 103F at home. He does smoke 1 pack of cigarettes daily, no alcohol. No high risk sexual behaviors.  Of note, patient was recently hospitalized from 10/4-10/8 for prostate abscess s/p unsuccessful transurethral unroofing followed by transrectal drain placement and wound cultures positive for strep angina gnosis.  He was discharged on Levaquin and had his Foley catheter removed on 10/11.  He then had recurrence of pain and swelling and was hospitalized from 10/28-10/25 for sepsis secondary to perennial/right scrotal abscess at which time he underwent cystourethroscopy and I&D of abscess by urology in the OR on 10/19 and discharged with a Foley catheter with Augmentin and a total of 3 weeks antibiotics.  ED Course: Febrile, tachycardic, hemodynamically stable, on room air. Notable Labs: Sodium 131, K3.8, glucose 152, BUN  8, creatinine 0.94, WBC 26.0, Hb 13.6, platelets 273, UA positive for infection, COVID-19 and flu negative. Notable Imaging: CT abdomen pelvis with contrast: Recurrent multiloculated perirectal abscesses demonstrating components above and below the levator ani involving the cruse of the corpus cavernosum bilaterally. Patient received 1 L LR bolus and maintenance fluid, vancomycin, metronidazole, cefepime, morphine, Zofran, Tylenol.  ED provider consulted urology who recommended NPO and plan for OR today, however, patient was eating breakfast upon my arrival to the room and was asked to stop eating.    Vitals:   10/20/20 0830 10/20/20 0900  BP: (!) 153/88 (!) 144/88  Pulse: (!) 107 99  Resp: (!) 23 (!) 25  Temp:    SpO2: 98% 98%     Review of Systems:  Review of Systems  All other systems reviewed and are negative.   Medical/Social/Family History   Past Medical History: History reviewed. No pertinent past medical history.  Past Surgical History:  Procedure Laterality Date  . IRRIGATION AND DEBRIDEMENT ABSCESS N/A 08/04/2020   Procedure: IRRIGATION AND DEBRIDEMENT PERINEAL ABSCESS, Lovett Sox;  Surgeon: Bjorn Pippin, MD;  Location: [facility];  Service: Urology;  Laterality: N/A;  . TRANSURETHRAL RESECTION OF PROSTATE  07/21/2020   Procedure: TRANSURETHRAL RESECTION OF THE PROSTATE (TURP);  Surgeon: Rene Paci, MD;  Location: [facility];  Service: Urology;;    Medications: Prior to Admission medications   Medication Sig Start  Date End Date Taking? Authorizing Provider  acetaminophen (TYLENOL) 325 MG tablet Take 2 tablets (650 mg total) by mouth every 6 (six) hours as needed for mild pain or fever (or Fever >/= 101). 08/10/20   Hongalgi, Maximino Greenland, MD  oxyCODONE (OXY IR/ROXICODONE) 5 MG immediate release tablet Take 1 tablet (5 mg total) by mouth every 8 (eight) hours as needed for moderate pain or severe pain. 08/10/20   Hongalgi, Maximino Greenland, MD  polyethylene glycol  (MIRALAX / GLYCOLAX) 17 g packet Take 17 g by mouth 2 (two) times daily. 08/10/20   Hongalgi, Maximino Greenland, MD  senna (SENOKOT) 8.6 MG TABS tablet Take 2 tablets (17.2 mg total) by mouth daily as needed for mild constipation or moderate constipation. 08/10/20   Hongalgi, Maximino Greenland, MD  traZODone (DESYREL) 50 MG tablet Take 50 mg by mouth at bedtime. 07/05/20   [provider]    Allergies:  No Known Allergies  Social History:  reports that he has been smoking cigarettes. He has been smoking about 1.00 pack per day. He has never used smokeless tobacco. He reports current alcohol use. He reports current drug use. Drug: Marijuana.  Family History: History reviewed. No pertinent family history.   Objective   Physical Exam: Blood pressure (!) 144/88, pulse 99, temperature 98.8 F (37.1 C), temperature source Oral, resp. rate (!) 25, SpO2 98 %.  Physical Exam Vitals and nursing note reviewed.  Constitutional:      Appearance: Normal appearance.  HENT:     Head: Normocephalic and atraumatic.  Eyes:     Conjunctiva/sclera: Conjunctivae normal.  Cardiovascular:     Rate and Rhythm: Normal rate and regular rhythm.  Pulmonary:     Effort: Pulmonary effort is normal.     Breath sounds: Normal breath sounds.  Abdominal:     General: Abdomen is flat.     Palpations: Abdomen is soft.  Genitourinary:    Testes:        Right: Tenderness present.  Musculoskeletal:        General: No swelling or tenderness.  Skin:    Coloration: Skin is not jaundiced or pale.  Neurological:     Mental Status: He is alert. Mental status is at baseline.  Psychiatric:        Mood and Affect: Mood normal.        Behavior: Behavior normal.     LABS on Admission: I have personally reviewed all the labs and imaging below    Basic Metabolic Panel: Recent Labs  Lab 10/18/20 1245 10/20/20 0420  NA 134* 131*  K 3.9 3.8  CL 97* 94*  CO2 24 22  GLUCOSE 94 152*  BUN 9 8  CREATININE 1.11 0.94   CALCIUM 9.3 9.2   Liver Function Tests: Recent Labs  Lab 10/18/20 1245 10/20/20 0420  AST 19 15  ALT 14 13  ALKPHOS 59 52  BILITOT 0.7 0.6  PROT 8.4* 8.3*  ALBUMIN 3.9 3.7   No results for input(s): LIPASE, AMYLASE in the last 168 hours. No results for input(s): AMMONIA in the last 168 hours. CBC: Recent Labs  Lab 10/18/20 1245 10/20/20 0420  WBC 19.4* 26.0*  NEUTROABS 15.8* 22.0*  HGB 14.4 13.6  HCT 43.9 41.1  MCV 81.8 81.1  PLT 275 273   Cardiac Enzymes: No results for input(s): CKTOTAL, CKMB, CKMBINDEX, TROPONINI in the last 168 hours. BNP: Invalid input(s): POCBNP CBG: No results for input(s): GLUCAP in the last 168 hours.  Radiological Exams  on Admission:  CT ABDOMEN PELVIS W CONTRAST  Result Date: 10/20/2020 CLINICAL DATA:  Rectal pain, rectal abscess, leukocytosis EXAM: CT ABDOMEN AND PELVIS WITH CONTRAST TECHNIQUE: Multidetector CT imaging of the abdomen and pelvis was performed using the standard protocol following bolus administration of intravenous contrast. CONTRAST:  OMNIPAQUE IOHEXOL 300 MG/ML  SOLN COMPARISON:  07/20/2020, 07/26/2020 FINDINGS: Lower chest: The visualized lung bases are clear bilaterally. The visualized heart and pericardium are unremarkable. Hepatobiliary: No focal liver abnormality is seen. No gallstones, gallbladder wall thickening, or biliary dilatation. Pancreas: Unremarkable Spleen: Unremarkable Adrenals/Urinary Tract: The adrenal glands are unremarkable. The kidneys are normal. The bladder is largely decompressed. Previously noted bladder wall thickening has improved in the interval. There is a defect seen at the bladder base involving the prostatic urethra in keeping with the given history of trans urethral resection. Stomach/Bowel: The stomach, small bowel, and large bowel are unremarkable. No free intraperitoneal gas or fluid. Vascular/Lymphatic: There is shotty bilateral external iliac lymphadenopathy, likely reactive in nature.  No frankly pathologic adenopathy within the abdomen and pelvis. The vascular structures are unremarkable. Reproductive: As noted above, a defect is seen within the prostate gland in keeping with the given history of trans urethral resection. Other: Just inferior to the prostate gland, surrounding the expected membranous urethra is a recurrent perirectal abscess measuring 3.8 x 4.4 cm at axial image # 80/2. This component above the levator ani appears separate from 2 additional components involving the crus of the corpus cavernosa bilaterally. On the right, this measures 2.0 x 6.6 cm on axial image # 87/2. On the left, this multiloculated collection is less well-defined measuring roughly 1.5 x 4.1 cm at axial image # 86/2. Musculoskeletal: No acute bone abnormality. IMPRESSION: Recurrent multiloculated perirectal abscesses demonstrating components both above the levator ani as well as below the levator involving the crus of the corpus cavernosa bilaterally. Interval changes of trans urethral resection of the prostate with improved bladder wall thickening. Electronically Signed   By: Helyn Numbers MD   On: 10/20/2020 06:46      EKG: Not done   A & P   Principal Problem:   Sepsis (HCC) Active Problems:   Perineal abscess   Tobacco abuse   1. Sepsis without septic shock secondary to recurrent multiloculated perirectal abscess a. Sepsis criteria: Fever, tachycardia, tachypnea, leukocytosis, abscess on CT b. History of polymicrobial abscesses c. Additional 1L LR bolus per sepsis protocol d. Blood cultures e. Discussed with Dr. Berneice Heinrich, Urology, who recommended Vanc/Zosyn and NPO for procedure today. Unfortunately, he was eating breakfast at roughly 9 am. This was relayed to Dr. Berneice Heinrich.   2. Tobacco abuse a. Nicotine patch b. Advise cessation   DVT prophylaxis: heparin   Code Status: Prior  Diet: NPO Family Communication: Admission, patients condition and plan of care including tests being  ordered have been discussed with the patient who indicates understanding and agrees with the plan and Code Status.  Disposition Plan: The appropriate patient status for this patient is INPATIENT. Inpatient status is judged to be reasonable and necessary in order to provide the required intensity of service to ensure the patient's safety. The patient's presenting symptoms, physical exam findings, and initial radiographic and laboratory data in the context of their chronic comorbidities is felt to place them at high risk for further clinical deterioration. Furthermore, it is not anticipated that the patient will be medically stable for discharge from the hospital within 2 midnights of admission. The following factors support the patient  status of inpatient.   " The patient's presenting symptoms include perirectal pain. " The worrisome physical exam findings include right testicular pain. " The initial radiographic and laboratory data are worrisome because of perineal abscess. " The chronic co-morbidities include recurrent perineal abscesses.   * I certify that at the point of admission it is my clinical judgment that the patient will require inpatient hospital care spanning beyond 2 midnights from the point of admission due to high intensity of service, high risk for further deterioration and high frequency of surveillance required.*   Status is: Inpatient  Remains inpatient appropriate because:IV treatments appropriate due to intensity of illness or inability to take PO and Inpatient level of care appropriate due to severity of illness   Dispo: The patient is from: Home              Anticipated d/c is to: Home              Anticipated d/c date is: 3 days              Patient currently is not medically stable to d/c.     Consultants  . urology  Procedures  . none  Time Spent on Admission: 66 minutes    Harold Hedge, DO [facility]  10/20/2020, 9:51 AM

## 2020-10-20 NOTE — ED Notes (Signed)
Transported to CT 

## 2020-10-20 NOTE — Anesthesia Procedure Notes (Addendum)
Procedure Name: Intubation Date/Time: 10/20/2020 4:29 PM Performed by: Montel Clock, CRNA Pre-anesthesia Checklist: Patient identified, Emergency Drugs available, Suction available, Patient being monitored and Timeout performed Patient Re-evaluated:Patient Re-evaluated prior to induction Oxygen Delivery Method: Circle system utilized Preoxygenation: Pre-oxygenation with 100% oxygen Induction Type: IV induction, Rapid sequence and Cricoid Pressure applied Laryngoscope Size: Mac and 4 Grade View: Grade II Tube type: Oral Tube size: 7.5 mm Number of attempts: 1 Airway Equipment and Method: Stylet Placement Confirmation: ETT inserted through vocal cords under direct vision,  positive ETCO2 and breath sounds checked- equal and bilateral Secured at: 23 cm Tube secured with: Tape Dental Injury: Teeth and Oropharynx as per pre-operative assessment  Comments: Grade 2 with downward laryngeal pressure.

## 2020-10-20 NOTE — Brief Op Note (Signed)
10/20/2020  5:25 PM  PATIENT:  Tyler [name]  35 y.o. male  PRE-OPERATIVE DIAGNOSIS:  recurrent prostate and perineal abcess  POST-OPERATIVE DIAGNOSIS:  recurrent prostate and perineal abcess  PROCEDURE:  Procedure(s): CYSTOSCOPY, TRANSURETHRAL RESECTION OF THE PROSTATE ABCESS(TURP) (N/A) IRRIGATION AND DEBRIDEMENT PERINEAL  ABSCESS (N/A)  SURGEON:  Surgeon(s) and Role:    Sebastian Ache, MD - Primary  PHYSICIAN ASSISTANT:   ASSISTANTS: none   ANESTHESIA:   general  EBL:  58mL   BLOOD ADMINISTERED:none  DRAINS: 1 - 3 way foley  to NS irrigation; 2 - perineal penrose to wound drainage   LOCAL MEDICATIONS USED:  NONE  SPECIMEN:  Source of Specimen:  perineal abscess swab and fluid  DISPOSITION OF SPECIMEN:  microbiology  COUNTS:  YES  TOURNIQUET:  * No tourniquets in log *  DICTATION: .Other Dictation: Dictation Number  [id number]  PLAN OF CARE: Admit to inpatient   PATIENT DISPOSITION:  PACU - hemodynamically stable.   Delay start of Pharmacological VTE agent (>24hrs) due to surgical blood loss or risk of bleeding: yes

## 2020-10-20 NOTE — Progress Notes (Signed)
Notified bedside nurse of need to administer antibiotics.  

## 2020-10-21 ENCOUNTER — Encounter (HOSPITAL_COMMUNITY): Payer: Self-pay | Admitting: Urology

## 2020-10-21 LAB — PROTIME-INR
INR: 1.3 — ABNORMAL HIGH (ref 0.8–1.2)
Prothrombin Time: 15.4 seconds — ABNORMAL HIGH (ref 11.4–15.2)

## 2020-10-21 LAB — HIV ANTIBODY (ROUTINE TESTING W REFLEX): HIV Screen 4th Generation wRfx: NONREACTIVE

## 2020-10-21 LAB — CBC
HCT: 37.9 % — ABNORMAL LOW (ref 39.0–52.0)
Hemoglobin: 12.4 g/dL — ABNORMAL LOW (ref 13.0–17.0)
MCH: 26.5 pg (ref 26.0–34.0)
MCHC: 32.7 g/dL (ref 30.0–36.0)
MCV: 81 fL (ref 80.0–100.0)
Platelets: 269 10*3/uL (ref 150–400)
RBC: 4.68 MIL/uL (ref 4.22–5.81)
RDW: 14.7 % (ref 11.5–15.5)
WBC: 30.4 10*3/uL — ABNORMAL HIGH (ref 4.0–10.5)
nRBC: 0 % (ref 0.0–0.2)

## 2020-10-21 LAB — URINE CULTURE: Culture: NO GROWTH

## 2020-10-21 LAB — BASIC METABOLIC PANEL
Anion gap: 11 (ref 5–15)
BUN: 8 mg/dL (ref 6–20)
CO2: 24 mmol/L (ref 22–32)
Calcium: 8.4 mg/dL — ABNORMAL LOW (ref 8.9–10.3)
Chloride: 99 mmol/L (ref 98–111)
Creatinine, Ser: 1.03 mg/dL (ref 0.61–1.24)
GFR, Estimated: >60 mL/min (ref >60)
Glucose, Bld: 145 mg/dL — ABNORMAL HIGH (ref 70–99)
Potassium: 4.1 mmol/L (ref 3.5–5.1)
Sodium: 134 mmol/L — ABNORMAL LOW (ref 135–145)

## 2020-10-21 LAB — PROCALCITONIN: Procalcitonin: 4.47 ng/mL

## 2020-10-21 LAB — LACTIC ACID, PLASMA: Lactic Acid, Venous: 1.1 mmol/L (ref 0.5–1.9)

## 2020-10-21 MED ORDER — OXYCODONE HCL 5 MG PO TABS
5.0000 mg | ORAL_TABLET | Freq: Four times a day (QID) | ORAL | Status: DC | PRN
  Administered 2020-10-21 – 2020-10-22 (×2): 10 mg via ORAL
  Administered 2020-10-22 – 2020-10-23 (×3): 5 mg via ORAL
  Administered 2020-10-23 – 2020-10-27 (×6): 10 mg via ORAL
  Administered 2020-10-27: 5 mg via ORAL
  Administered 2020-10-28: 10 mg via ORAL
  Administered 2020-10-29: 5 mg via ORAL
  Filled 2020-10-21: qty 2
  Filled 2020-10-21 (×2): qty 1
  Filled 2020-10-21: qty 2
  Filled 2020-10-21: qty 1
  Filled 2020-10-21: qty 2
  Filled 2020-10-21: qty 1
  Filled 2020-10-21 (×6): qty 2
  Filled 2020-10-21: qty 1
  Filled 2020-10-21: qty 2

## 2020-10-21 MED ORDER — SENNOSIDES-DOCUSATE SODIUM 8.6-50 MG PO TABS
2.0000 | ORAL_TABLET | Freq: Two times a day (BID) | ORAL | Status: DC
  Administered 2020-10-21 – 2020-10-31 (×19): 2 via ORAL
  Filled 2020-10-21 (×19): qty 2

## 2020-10-21 MED ORDER — MORPHINE SULFATE (PF) 2 MG/ML IV SOLN
1.0000 mg | Freq: Once | INTRAVENOUS | Status: AC
  Administered 2020-10-21: 1 mg via INTRAVENOUS
  Filled 2020-10-21: qty 1

## 2020-10-21 MED ORDER — ACETAMINOPHEN 325 MG PO TABS
650.0000 mg | ORAL_TABLET | Freq: Four times a day (QID) | ORAL | Status: DC | PRN
  Administered 2020-10-22: 650 mg via ORAL
  Filled 2020-10-21: qty 2

## 2020-10-21 NOTE — Plan of Care (Signed)
  Problem: Clinical Measurements: Goal: Will remain free from infection Outcome: Progressing   Problem: Clinical Measurements: Goal: Diagnostic test results will improve Outcome: Progressing   Problem: Clinical Measurements: Goal: Respiratory complications will improve Outcome: Progressing   Problem: Activity: Goal: Risk for activity intolerance will decrease Outcome: Progressing   Problem: Pain Managment: Goal: General experience of comfort will improve Outcome: Progressing   Problem: Skin Integrity: Goal: Risk for impaired skin integrity will decrease Outcome: Progressing   

## 2020-10-21 NOTE — Progress Notes (Signed)
Informed by CCMD that pt HR sustaining to 130-140 pt currently on red mews upon arrival from PACU, @2103  pt Temp 100.3, HR 126, RR 28, BP 126/84, notified on call MD and ordered Tylenol 1000 mg, given and bolus 500 ml LR, infusing and aslo given PRN morphine for severe pain. EKG done with Sinus Tachycardia, pt O2 sat 97 RA, no signs of respiratory distress, will continue to monitor.

## 2020-10-21 NOTE — Progress Notes (Signed)
1 Day Post-Op   Subjective/Chief Complaint:  1-  Recurrent peri-prostatic, perineal, penile abscess - smoldering abscess initially managed 07/2020 with recurrence by imaging and exam today involving  base of penis, prostate. Prior CX 07/2020 polymicrobial (e.coli, actinomyces, strep) sens Vanc + Gent. HIV negative, no h/o diabetes or diverticulitis.   Repeat cysto TURP (no intraprostatic abscess), perineal I+D (large peri-prostatic / perineal abscess + drain placement) 10/21/19. [name], [facility] 10/20/2020 pending. Placed on Vanc + Zosyn.   Today "Tyler [name]" still has severe infectious parameters. Tachycardia, leukocytosis, but no hypotension c/w expected worsrening SIRS after abscess manipulation. No new CX data. Fever curve appears to be trending dow.    Objective: Vital signs in last 24 hours: Temp:  [98.5 F (36.9 C)-102.8 F (39.3 C)] 98.5 F (36.9 C) (01/05 1519) Pulse Rate:  [104-132] 128 (01/05 1519) Resp:  [18-32] 18 (01/05 1519) BP: (125-162)/(68-99) 144/81 (01/05 1519) SpO2:  [93 %-100 %] 100 % (01/05 1519) Weight:  [86.1 kg] 86.1 kg (01/05 0455) Last BM Date: 10/19/20  Intake/Output from previous day: 01/04 0701 - 01/05 0700 In: 3826.4 [I.V.:2534.8; IV Piggyback:1291.6] Out: 4200 [Urine:4175; Blood:25] Intake/Output this shift: Total I/O In: 480 [P.O.:480] Out: -   General appearance: alert, cooperative and very pleasant, at baseline.  Eyes: negative Nose: Nares normal. Septum midline. Mucosa normal. No drainage or sinus tenderness. Throat: lips, mucosa, and tongue normal; teeth and gums normal Neck: supple, symmetrical, trachea midline Back: symmetric, no curvature. ROM normal. No CVA tenderness. Cardio: regular tachycardia GI: soft, non-tender; bowel sounds normal; no masses,  no organomegaly Male genitalia: less penile base and perineal induration. Perineal penrose in expected position to woudn drainage with scant output. Foley in place wtih very light pinnk urine that  is non-foul.  Pulses: 2+ and symmetric Lymph nodes: Cervical, supraclavicular, and axillary nodes normal. Neurologic: Grossly normal  Lab Results:  Recent Labs    10/20/20 0420 10/21/20 0029  WBC 26.0* 30.4*  HGB 13.6 12.4*  HCT 41.1 37.9*  PLT 273 269   BMET Recent Labs    10/20/20 0420 10/21/20 0029  NA 131* 134*  K 3.8 4.1  CL 94* 99  CO2 22 24  GLUCOSE 152* 145*  BUN 8 8  CREATININE 0.94 1.03  CALCIUM 9.2 8.4*   PT/INR Recent Labs    10/20/20 0620 10/21/20 0029  LABPROT 14.6 15.4*  INR 1.2 1.3*   ABG No results for input(s): PHART, HCO3 in the last 72 hours.  Invalid input(s): PCO2, PO2  Studies/Results: CT ABDOMEN PELVIS W CONTRAST  Result Date: 10/20/2020 CLINICAL DATA:  Rectal pain, rectal abscess, leukocytosis EXAM: CT ABDOMEN AND PELVIS WITH CONTRAST TECHNIQUE: Multidetector CT imaging of the abdomen and pelvis was performed using the standard protocol following bolus administration of intravenous contrast. CONTRAST:  OMNIPAQUE IOHEXOL 300 MG/ML  SOLN COMPARISON:  07/20/2020, 07/26/2020 FINDINGS: Lower chest: The visualized lung bases are clear bilaterally. The visualized heart and pericardium are unremarkable. Hepatobiliary: No focal liver abnormality is seen. No gallstones, gallbladder wall thickening, or biliary dilatation. Pancreas: Unremarkable Spleen: Unremarkable Adrenals/Urinary Tract: The adrenal glands are unremarkable. The kidneys are normal. The bladder is largely decompressed. Previously noted bladder wall thickening has improved in the interval. There is a defect seen at the bladder base involving the prostatic urethra in keeping with the given history of trans urethral resection. Stomach/Bowel: The stomach, small bowel, and large bowel are unremarkable. No free intraperitoneal gas or fluid. Vascular/Lymphatic: There is shotty bilateral external iliac lymphadenopathy, likely reactive  in nature. No frankly pathologic adenopathy within the  abdomen and pelvis. The vascular structures are unremarkable. Reproductive: As noted above, a defect is seen within the prostate gland in keeping with the given history of trans urethral resection. Other: Just inferior to the prostate gland, surrounding the expected membranous urethra is a recurrent perirectal abscess measuring 3.8 x 4.4 cm at axial image # 80/2. This component above the levator ani appears separate from 2 additional components involving the crus of the corpus cavernosa bilaterally. On the right, this measures 2.0 x 6.6 cm on axial image # 87/2. On the left, this multiloculated collection is less well-defined measuring roughly 1.5 x 4.1 cm at axial image # 86/2. Musculoskeletal: No acute bone abnormality. IMPRESSION: Recurrent multiloculated perirectal abscesses demonstrating components both above the levator ani as well as below the levator involving the crus of the corpus cavernosa bilaterally. Interval changes of trans urethral resection of the prostate with improved bladder wall thickening. Electronically Signed   By: Helyn Numbers MD   On: 10/20/2020 06:46    Anti-infectives: Anti-infectives (From admission, onward)   Start     Dose/Rate Route Frequency Ordered Stop   10/20/20 2200  vancomycin (VANCOREADY) IVPB 1500 mg/300 mL        1,500 mg 150 mL/hr over 120 Minutes Intravenous Every 12 hours 10/20/20 1017     10/20/20 1800  vancomycin (VANCOREADY) IVPB 1500 mg/300 mL  Status:  Discontinued        1,500 mg 150 mL/hr over 120 Minutes Intravenous Every 12 hours 10/20/20 1015 10/20/20 1017   10/20/20 1400  piperacillin-tazobactam (ZOSYN) IVPB 3.375 g        3.375 g 12.5 mL/hr over 240 Minutes Intravenous Every 8 hours 10/20/20 1015     10/20/20 1200  vancomycin (VANCOREADY) IVPB 500 mg/100 mL        500 mg 100 mL/hr over 60 Minutes Intravenous  Once 10/20/20 1017 10/20/20 1620   10/20/20 0615  ceFEPIme (MAXIPIME) 2 g in sodium chloride 0.9 % 100 mL IVPB        2 g 200 mL/hr  over 30 Minutes Intravenous STAT 10/20/20 0609 10/20/20 0936   10/20/20 0615  vancomycin (VANCOREADY) IVPB 1500 mg/300 mL        1,500 mg 150 mL/hr over 120 Minutes Intravenous STAT 10/20/20 0610 10/20/20 0815   10/20/20 0600  metroNIDAZOLE (FLAGYL) IVPB 500 mg        500 mg 100 mL/hr over 60 Minutes Intravenous  Once 10/20/20 0551 10/20/20 1035      Assessment/Plan:  1-  Recurrent peri-prostatic, perineal, penile abscess - now s/p aggressive surgical debridement. Transient worsening of SIRS/inrectious parameters typical at this point and will hopefully now trend better on ABX and s/p drainage.   Agree with current ABX pending additional CX data.  Keep current catheter and perineal wound drain for now.  Greatly appreciate medical team comanagement.   Please call me directly anytime to discuss.   Sebastian Ache 10/21/2020

## 2020-10-21 NOTE — Plan of Care (Signed)
  Problem: Education: Goal: Knowledge of General Education information will improve Description: Including pain rating scale, medication(s)/side effects and non-pharmacologic comfort measures 10/21/2020 1654 by Mariann Laster, RN Outcome: Progressing 10/21/2020 1653 by Mariann Laster, RN Outcome: Progressing   Problem: Clinical Measurements: Goal: Will remain free from infection 10/21/2020 1654 by Mariann Laster, RN Outcome: Progressing 10/21/2020 1653 by Mariann Laster, RN Outcome: Progressing Goal: Cardiovascular complication will be avoided Outcome: Progressing   Problem: Activity: Goal: Risk for activity intolerance will decrease Outcome: Progressing   Problem: Coping: Goal: Level of anxiety will decrease 10/21/2020 1654 by Mariann Laster, RN Outcome: Progressing 10/21/2020 1653 by Mariann Laster, RN Outcome: Progressing   Problem: Safety: Goal: Ability to remain free from injury will improve Outcome: Progressing

## 2020-10-21 NOTE — Progress Notes (Signed)
[facility]  PROGRESS NOTE  Tyler [name] ERX:[id number] DOB: 1985-10-24 DOA: 10/20/2020 PCP: Patient, No Pcp Per Admit date - 10/20/2020   Admitting Physician Jae Dire, MD  Outpatient Primary MD for the patient is Patient, No Pcp Per  LOS - 1 Brief Narrative   Tyler [name] is a 35 y.o. year old male with medical history significant for Recurrent prostate abscess (he has had 2 prior admissions: 10/4-10/8 with unsuccessful transurethral moving followed by transrectal drain placement with wound cultures growing strep angina gnosis, followed by hospitalization 10/19-2010/25 for sepsis due to perineal/right scrotal abscess requiring I&D).  During last hospitalization patient underwent cystourethroscopy and I&D of abscess on 10/19 completed a week of empiric IV vancomycin, cefepime and Flagyl, blood cultures remain unremarkable at that time and abscess culture showed mixed anaerobic flora and was discharged 2 to 3 weeks of oral Augmentin.  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  He is admitted with working diagnosis sepsis secondary to recurrent multiloculated perirectal abscesses, given his history of polymicrobial abscesses he was started on empiric vancomycin and Zosyn and underwent operative I&D of perineal abscess by urology as well as transurethral resection of the prostate abscess on 1/4.  Hospital course complicated by persistent tachycardia slowly downtrending fever and persistent perineal/prostate pain.    Subjective  Pain but feels much better than when he came today.  Is difficult for him to find comfortable position while lying on his back.  Still requiring IV pain control  A & P   Sepsis secondary to recurrent prostate and perineal abscess, status post TURP abscess and I&D of  perineal abscess on 1/4.  Fevers slowly downtrending with T-max of 103 last 24 hours this am T-max 100.8, still tachycardic 120s and white count 30 (up from 26), lactic acidosis resolved no evidence of endorgan damage currently - Repeat blood cultures, monitor admission blood cultures - GPC/GNR's in the surgical wound culture, awaiting further results -continue vancomycin, Zosyn - Appreciate urology assistance, managing perineal Penrose drainage and three-way Foley - Repeat CBC in a.m. - Continue maintenance fluids - May warrant repeat CT imaging to ensure adequate drainage of abscess if continues to show sepsis physiology -Pain control IV morphine as needed severe pain, add Tylenol for mild, oxycodone for moderate as needed pain  Recurrent prostate/perineal abscesses, unclear risk factors.  Patient has no known history of immunosuppression.  Last HIV test in October was negative.   -Repeat HIV antibody - Check GC/chlamydia urine  Tobacco use - Nicotine patch in place - Cessation emphasized on admission is     Family Communication  : None  Code Status : Full  Disposition Plan  :  Patient is from home. Anticipated d/c date: 2 to 3 days. Barriers to d/c or necessity for inpatient status:  Consults  : Urology  Procedures  : TURP and I&D of perineal abscess on 1/4  DVT Prophylaxis  : Heparin MDM: The below labs and imaging reports were reviewed and summarized above.  Medication management as above.  Lab Results  Component Value Date   PLT 269 10/21/2020    Diet :  Diet Order            Diet regular Room service appropriate? Yes; Fluid consistency: Thin  Diet effective now  Inpatient Medications Scheduled Meds: . Chlorhexidine Gluconate Cloth  6 each Topical Daily  . heparin  5,000 Units Subcutaneous Q8H  . nicotine  21 mg Transdermal Daily   Continuous Infusions: . lactated ringers    . lactated ringers 100 mL/hr at 10/21/20 0336  .  piperacillin-tazobactam (ZOSYN)  IV 3.375 g (10/21/20 1610)  . sodium chloride irrigation    . vancomycin 1,500 mg (10/21/20 0934)   PRN Meds:.morphine injection, ondansetron **OR** ondansetron (ZOFRAN) IV, polyethylene glycol  Antibiotics  :   Anti-infectives (From admission, onward)   Start     Dose/Rate Route Frequency Ordered Stop   10/20/20 2200  vancomycin (VANCOREADY) IVPB 1500 mg/300 mL        1,500 mg 150 mL/hr over 120 Minutes Intravenous Every 12 hours 10/20/20 1017     10/20/20 1800  vancomycin (VANCOREADY) IVPB 1500 mg/300 mL  Status:  Discontinued        1,500 mg 150 mL/hr over 120 Minutes Intravenous Every 12 hours 10/20/20 1015 10/20/20 1017   10/20/20 1400  piperacillin-tazobactam (ZOSYN) IVPB 3.375 g        3.375 g 12.5 mL/hr over 240 Minutes Intravenous Every 8 hours 10/20/20 1015     10/20/20 1200  vancomycin (VANCOREADY) IVPB 500 mg/100 mL        500 mg 100 mL/hr over 60 Minutes Intravenous  Once 10/20/20 1017 10/20/20 1620   10/20/20 0615  ceFEPIme (MAXIPIME) 2 g in sodium chloride 0.9 % 100 mL IVPB        2 g 200 mL/hr over 30 Minutes Intravenous STAT 10/20/20 0609 10/20/20 0936   10/20/20 0615  vancomycin (VANCOREADY) IVPB 1500 mg/300 mL        1,500 mg 150 mL/hr over 120 Minutes Intravenous STAT 10/20/20 0610 10/20/20 0815   10/20/20 0600  metroNIDAZOLE (FLAGYL) IVPB 500 mg        500 mg 100 mL/hr over 60 Minutes Intravenous  Once 10/20/20 0551 10/20/20 1035       Objective   Vitals:   10/21/20 0447 10/21/20 0455 10/21/20 0857 10/21/20 1234  BP: (!) 143/86  128/81 125/68  Pulse: (!) 104  (!) 114 (!) 116  Resp: (!) 26  20 (!) 23  Temp: 99.5 F (37.5 C)  (!) 100.8 F (38.2 C) (!) 100.4 F (38 C)  TempSrc: Oral  Oral Oral  SpO2: 96%  98% 98%  Weight:  86.1 kg    Height:  6' (1.829 m)      SpO2: 98 % O2 Flow Rate (L/min): 2 L/min  Wt Readings from Last 3 Encounters:  10/21/20 86.1 kg  08/04/20 81.6 kg  07/21/20 85.9 kg      Intake/Output Summary (Last 24 hours) at 10/21/2020 1318 Last data filed at 10/21/2020 0600 Gross per 24 hour  Intake 3526.37 ml  Output 3800 ml  Net -273.63 ml    Physical Exam:   Awake Alert, Oriented X 3, Normal affect No new F.N deficits,  [name].AT, Normal respiratory effort on room air, CTAB Tachycardic,No Gallops,Rubs or new Murmurs,  +ve B.Sounds, Abd Soft, No tenderness, No rebound, guarding or rigidity. Foley catheter in place Penrose drain in place Exquisite tenderness in perineum and perirectum with slight erythema but no induration or fluctuance      I have personally reviewed the following:   Data Reviewed:  CBC Recent Labs  Lab 10/18/20 1245 10/20/20 0420 10/21/20 0029  WBC 19.4* 26.0* 30.4*  HGB 14.4 13.6 12.4*  HCT 43.9 41.1  37.9*  PLT 275 273 269  MCV 81.8 81.1 81.0  MCH 26.8 26.8 26.5  MCHC 32.8 33.1 32.7  RDW 15.0 14.8 14.7  LYMPHSABS 1.8 1.7  --   MONOABS 1.7* 1.9*  --   EOSABS 0.0 0.0  --   BASOSABS 0.0 0.1  --     Chemistries  Recent Labs  Lab 10/18/20 1245 10/20/20 0420 10/21/20 0029  NA 134* 131* 134*  K 3.9 3.8 4.1  CL 97* 94* 99  CO2 24 22 24   GLUCOSE 94 152* 145*  BUN 9 8 8   CREATININE 1.11 0.94 1.03  CALCIUM 9.3 9.2 8.4*  AST 19 15  --   ALT 14 13  --   ALKPHOS 59 52  --   BILITOT 0.7 0.6  --    ------------------------------------------------------------------------------------------------------------------ No results for input(s): CHOL, HDL, LDLCALC, TRIG, CHOLHDL, LDLDIRECT in the last 72 hours.  No results found for: HGBA1C ------------------------------------------------------------------------------------------------------------------ No results for input(s): TSH, T4TOTAL, T3FREE, THYROIDAB in the last 72 hours.  Invalid input(s): FREET3 ------------------------------------------------------------------------------------------------------------------ No results for input(s): VITAMINB12, FOLATE, FERRITIN,  TIBC, IRON, RETICCTPCT in the last 72 hours.  Coagulation profile Recent Labs  Lab 10/20/20 0620 10/21/20 0029  INR 1.2 1.3*    No results for input(s): DDIMER in the last 72 hours.  Cardiac Enzymes No results for input(s): CKMB, TROPONINI, MYOGLOBIN in the last 168 hours.  Invalid input(s): CK ------------------------------------------------------------------------------------------------------------------ No results found for: BNP  Micro Results Recent Results (from the past 240 hour(s))  Urine culture     Status: None   Collection Time: 10/20/20  6:00 AM   Specimen: Urine, Clean Catch  Result Value Ref Range Status   Specimen Description   Final    URINE, CLEAN CATCH Performed at [facility], [address], [place], [place]    Special Requests   Final    NONE Performed at [facility], [address], [place], [place]    Culture   Final    NO GROWTH Performed at [facility], [address], [place], [address] [place]    Report Status 10/21/2020 FINAL  Final  Resp Panel by RT-PCR (Flu A&B, Covid) Nasopharyngeal Swab     Status: None   Collection Time: 10/20/20  6:18 AM   Specimen: Nasopharyngeal Swab; Nasopharyngeal(NP) swabs in vial transport medium  Result Value Ref Range Status   SARS Coronavirus 2 by RT PCR NEGATIVE NEGATIVE Final    Comment: (NOTE) SARS-CoV-2 target nucleic acids are NOT DETECTED.  The SARS-CoV-2 RNA is generally detectable in upper respiratory specimens during the acute phase of infection. The lowest concentration of SARS-CoV-2 viral copies this assay can detect is 138 copies/mL. A negative result does not preclude SARS-Cov-2 infection and should not be used as the sole basis for treatment or other patient management decisions. A negative result may occur with  improper specimen collection/handling, submission of specimen other than nasopharyngeal swab, presence of viral  mutation(s) within the areas targeted by this assay, and inadequate number of viral copies(<138 copies/mL). A negative result must be combined with clinical observations, patient history, and epidemiological information. The expected result is Negative.  Fact Sheet for Patients:  12/19/2020  Fact Sheet for Healthcare Providers:  12/18/20  This test is no t yet approved or cleared by the [employer] FDA and  has been authorized for detection and/or diagnosis of SARS-CoV-2 by FDA under an Emergency Use Authorization (EUA). This EUA will remain  in effect (meaning this test can be used) for the duration of the COVID-19 declaration under Section 564(b)(1) of the Act, 21 U.S.C.section 360bbb-3(b)(1), unless the authorization is terminated  or revoked sooner.       Influenza A by PCR NEGATIVE NEGATIVE Final   Influenza B by PCR NEGATIVE NEGATIVE Final    Comment: (NOTE) The Xpert Xpress SARS-CoV-2/FLU/RSV plus assay is intended as an aid in the diagnosis of influenza from Nasopharyngeal swab specimens and should not be used as a sole basis for treatment. Nasal washings and aspirates are unacceptable for Xpert Xpress SARS-CoV-2/FLU/RSV testing.  Fact Sheet for Patients: [employer]  Fact Sheet for Healthcare Providers: [employer]  This test is not yet approved or cleared by the [place] FDA and has been authorized for detection and/or diagnosis of SARS-CoV-2 by FDA under an Emergency Use Authorization (EUA). This EUA will remain in effect (meaning this test can be used) for the duration of the COVID-19 declaration under Section 564(b)(1) of the Act, 21 U.S.C. section 360bbb-3(b)(1), unless the authorization is terminated or revoked.  Performed at [facility], [address], [place], [place] [address]   Blood culture (routine  x 2)     Status: None (Preliminary result)   Collection Time: 10/20/20  6:20 AM   Specimen: BLOOD  Result Value Ref Range Status   Specimen Description   Final    BLOOD LEFT ANTECUBITAL Performed at [facility], [address], [place], [place] [address]    Special Requests   Final    BOTTLES DRAWN AEROBIC ONLY Blood Culture results may not be optimal due to an inadequate volume of blood received in culture bottles Performed at [facility], [address], [place], [place] [address]    Culture   Final    NO GROWTH < 24 HOURS Performed at [facility], [address], [place], [place] [address]    Report Status PENDING  Incomplete  Blood culture (routine x 2)     Status: None (Preliminary result)   Collection Time: 10/20/20  8:55 AM   Specimen: BLOOD  Result Value Ref Range Status   Specimen Description   Final    BLOOD RIGHT WRIST Performed at [facility], [address] [place], [place] [address]    Special Requests   Final    BOTTLES DRAWN AEROBIC AND ANAEROBIC Blood Culture results may not be optimal due to an excessive volume of blood received in culture bottles Performed at [facility], [address], [place], [place] [address]    Culture   Final    NO GROWTH < 24 HOURS Performed at [facility], [address] [place], [place] [address]    Report Status PENDING  Incomplete  Surgical pcr screen     Status: None   Collection Time: 10/20/20  1:24 PM   Specimen: Nasal Mucosa; Nasal Swab  Result Value Ref Range Status   MRSA, PCR NEGATIVE NEGATIVE Final   Staphylococcus aureus NEGATIVE NEGATIVE Final    Comment: (NOTE) The Xpert SA Assay (FDA approved for NASAL specimens in patients 65 years of age and older), is one component of a comprehensive surveillance program. It is not intended to diagnose infection nor to guide or monitor treatment. Performed at [facility], [address], [place], [place] [address]   Aerobic/Anaerobic Culture (surgical/deep wound)     Status: None (Preliminary result)   Collection Time:  10/20/20  5:07 PM   Specimen: PATH Soft tissue  Result Value Ref Range Status   Specimen Description PERINEAL ABSCESS  Final   Special Requests DRAINAGE  Final   Gram Stain   Final    MODERATE WBC PRESENT,BOTH PMN AND MONONUCLEAR FEW GRAM POSITIVE COCCI IN PAIRS IN CHAINS MODERATE GRAM NEGATIVE RODS RARE GRAM POSITIVE RODS    Culture   Final    NO GROWTH < 12 HOURS Performed at [facility], [address] [place], [place] [address]    Report Status PENDING  Incomplete  Aerobic Culture (superficial specimen)     Status: None (Preliminary result)   Collection Time: 10/20/20  5:07 PM   Specimen: Wound  Result Value Ref Range Status   Specimen Description WOUND PERINEAL ABSCESS  Final   Special Requests SWAB  Final   Gram Stain NO WBC SEEN NO ORGANISMS SEEN   Final   Culture   Final    NO GROWTH < 12 HOURS Performed at [facility], [address] [place], [place] [address]    Report Status PENDING  Incomplete    Radiology Reports CT ABDOMEN PELVIS W CONTRAST  Result Date: 10/20/2020 CLINICAL DATA:  Rectal pain, rectal abscess, leukocytosis EXAM: CT ABDOMEN AND PELVIS WITH CONTRAST TECHNIQUE: Multidetector CT imaging of the abdomen and pelvis was performed using the standard protocol following bolus administration of intravenous contrast. CONTRAST:  OMNIPAQUE IOHEXOL 300 MG/ML  SOLN COMPARISON:  07/20/2020, 07/26/2020 FINDINGS: Lower chest: The visualized lung bases are clear bilaterally. The visualized heart and pericardium are unremarkable. Hepatobiliary: No focal liver abnormality is seen. No gallstones, gallbladder wall thickening, or biliary dilatation. Pancreas: Unremarkable Spleen: Unremarkable Adrenals/Urinary Tract: The adrenal glands are unremarkable. The kidneys are normal. The bladder is  largely decompressed. Previously noted bladder wall thickening has improved in the interval. There is a defect seen at the bladder base involving the prostatic urethra in keeping with the given history of trans urethral resection. Stomach/Bowel: The stomach, small bowel, and large bowel are unremarkable. No free intraperitoneal gas or fluid. Vascular/Lymphatic: There is shotty bilateral external iliac lymphadenopathy, likely reactive in nature. No frankly pathologic adenopathy within the abdomen and pelvis. The vascular structures are unremarkable. Reproductive: As noted above, a defect is seen within the prostate gland in keeping with the given history of trans urethral resection. Other: Just inferior to the prostate gland, surrounding the expected membranous urethra is a recurrent perirectal abscess measuring 3.8 x 4.4 cm at axial image # 80/2. This component above the levator ani appears separate from 2 additional components involving the crus of the corpus cavernosa bilaterally. On the right, this measures 2.0 x 6.6 cm on axial image # 87/2. On the left, this multiloculated collection is less well-defined measuring roughly 1.5 x 4.1 cm at axial image # 86/2. Musculoskeletal: No acute bone abnormality. IMPRESSION: Recurrent multiloculated perirectal abscesses demonstrating components both above the levator ani as well as below the levator involving the crus of the corpus cavernosa bilaterally. Interval changes of trans urethral resection of the prostate with improved bladder wall thickening. Electronically Signed   By: Helyn Numbers MD   On: 10/20/2020 06:46     Time Spent in minutes  30     Laverna Peace M.D on 10/21/2020 at 1:18 PM  To page go to [email or web address] - password [facility]

## 2020-10-22 LAB — BASIC METABOLIC PANEL
Anion gap: 11 (ref 5–15)
BUN: 9 mg/dL (ref 6–20)
CO2: 25 mmol/L (ref 22–32)
Calcium: 8.7 mg/dL — ABNORMAL LOW (ref 8.9–10.3)
Chloride: 98 mmol/L (ref 98–111)
Creatinine, Ser: 1.11 mg/dL (ref 0.61–1.24)
GFR, Estimated: >60 mL/min (ref >60)
Glucose, Bld: 96 mg/dL (ref 70–99)
Potassium: 3.8 mmol/L (ref 3.5–5.1)
Sodium: 134 mmol/L — ABNORMAL LOW (ref 135–145)

## 2020-10-22 LAB — CBC
HCT: 38.6 % — ABNORMAL LOW (ref 39.0–52.0)
Hemoglobin: 12.4 g/dL — ABNORMAL LOW (ref 13.0–17.0)
MCH: 26.6 pg (ref 26.0–34.0)
MCHC: 32.1 g/dL (ref 30.0–36.0)
MCV: 82.8 fL (ref 80.0–100.0)
Platelets: 264 10*3/uL (ref 150–400)
RBC: 4.66 MIL/uL (ref 4.22–5.81)
RDW: 15 % (ref 11.5–15.5)
WBC: 22.4 10*3/uL — ABNORMAL HIGH (ref 4.0–10.5)
nRBC: 0 % (ref 0.0–0.2)

## 2020-10-22 LAB — GC/CHLAMYDIA PROBE AMP (~~LOC~~) NOT AT ARMC
Chlamydia: NEGATIVE
Comment: NEGATIVE
Comment: NORMAL
Neisseria Gonorrhea: NEGATIVE

## 2020-10-22 MED ORDER — LIP MEDEX EX OINT
TOPICAL_OINTMENT | CUTANEOUS | Status: DC | PRN
  Filled 2020-10-22: qty 7

## 2020-10-22 NOTE — Progress Notes (Signed)
[facility]  PROGRESS NOTE  Tyler CoopDerek [name] YQM:[id number] DOB: 1986/08/30 DOA: 10/20/2020 PCP: Patient, No Pcp Per Admit date - 10/20/2020   Admitting Physician Jae DireJared E Segal, MD  Outpatient Primary MD for the patient is Patient, No Pcp Per  LOS - 2 Brief Narrative   Tyler CoopDerek Kirlin is a 35 y.o. year old male with medical history significant for Recurrent prostate abscess (he has had 2 prior admissions: 10/4-10/8 with unsuccessful transurethral moving followed by transrectal drain placement with wound cultures growing strep angina gnosis, followed by hospitalization 10/19-2010/25 for sepsis due to perineal/right scrotal abscess requiring I&D).  During last hospitalization patient underwent cystourethroscopy and I&D of abscess on 10/19 completed a week of empiric IV vancomycin, cefepime and Flagyl, blood cultures remain unremarkable at that time and abscess culture showed mixed anaerobic flora and was discharged 2 to 3 weeks of oral Augmentin.  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  He is admitted with working diagnosis sepsis secondary to recurrent multiloculated perirectal abscesses, given his history of polymicrobial abscesses he was started on empiric vancomycin and Zosyn and underwent operative I&D of perineal abscess by urology as well as transurethral resection of the prostate abscess on 1/4.  Hospital course complicated by persistent tachycardia slowly downtrending fever and persistent perineal/prostate pain.    Subjective  Reports left testicular pain he has noticed some increased swelling of his scrotum.  Still having pain in his posterior/buttocks area  A & P   Sepsis secondary to recurrent prostate and perineal abscess, status post TURP abscess and I&D of perineal abscess on 1/4.  Fevers  continue to downtrend with T-max of one 1.4 (previous T-max of 103) as well as only mild tachycardia continue downtrending leukocytosis.  Lactic acidosis resolved no evidence of endorgan damage currently.  Still having significant perineal pain -monitor admission and repeat blood cultures from 1/5 blood cultures - GPC/GNR's in the surgical wound culture, awaiting further results -continue vancomycin, Zosyn - Appreciate urology assistance, managing perineal Penrose drainage for perineal wound, Foley catheter removed - Repeat CBC in a.m. - DC maintenance fluids -Pain control IV morphine as needed severe pain, add Tylenol for mild, oxycodone for moderate as needed pain.  Optimize bowel regimen  Recurrent prostate/perineal abscesses, unclear risk factors.  Patient has no known history of immunosuppression.  Last HIV test in October was negative.  HIV antibody and GC chlamydia both negative  Tobacco use - Nicotine patch in place - Cessation emphasized on admission     Family Communication  : None  Code Status : Full  Disposition Plan  :  Patient is from home. Anticipated d/c date: 2 to 3 days. Barriers to d/c or necessity for inpatient status:  Still requiring.  IV antibiotics, IV pain control for significant perineal pain Consults  : Urology  Procedures  : TURP and I&D of perineal abscess on 1/4  DVT Prophylaxis  : Heparin MDM: The below labs and imaging reports were reviewed and summarized above.  Medication management as above.  Lab Results  Component Value Date   PLT 264 10/22/2020    Diet :  Diet Order            Diet regular Room service appropriate? Yes; Fluid consistency: Thin  Diet effective now  Inpatient Medications Scheduled Meds: . Chlorhexidine Gluconate Cloth  6 each Topical Daily  . nicotine  21 mg Transdermal Daily  . senna-docusate  2 tablet Oral BID   Continuous Infusions: . lactated ringers    . piperacillin-tazobactam (ZOSYN)  IV  3.375 g (10/22/20 1557)  . vancomycin 150 mL/hr at 10/22/20 1003   PRN Meds:.acetaminophen, morphine injection, ondansetron **OR** ondansetron (ZOFRAN) IV, oxyCODONE, polyethylene glycol  Antibiotics  :   Anti-infectives (From admission, onward)   Start     Dose/Rate Route Frequency Ordered Stop   10/20/20 2200  vancomycin (VANCOREADY) IVPB 1500 mg/300 mL        1,500 mg 150 mL/hr over 120 Minutes Intravenous Every 12 hours 10/20/20 1017     10/20/20 1800  vancomycin (VANCOREADY) IVPB 1500 mg/300 mL  Status:  Discontinued        1,500 mg 150 mL/hr over 120 Minutes Intravenous Every 12 hours 10/20/20 1015 10/20/20 1017   10/20/20 1400  piperacillin-tazobactam (ZOSYN) IVPB 3.375 g        3.375 g 12.5 mL/hr over 240 Minutes Intravenous Every 8 hours 10/20/20 1015     10/20/20 1200  vancomycin (VANCOREADY) IVPB 500 mg/100 mL        500 mg 100 mL/hr over 60 Minutes Intravenous  Once 10/20/20 1017 10/20/20 1620   10/20/20 0615  ceFEPIme (MAXIPIME) 2 g in sodium chloride 0.9 % 100 mL IVPB        2 g 200 mL/hr over 30 Minutes Intravenous STAT 10/20/20 0609 10/20/20 0936   10/20/20 0615  vancomycin (VANCOREADY) IVPB 1500 mg/300 mL        1,500 mg 150 mL/hr over 120 Minutes Intravenous STAT 10/20/20 0610 10/20/20 0815   10/20/20 0600  metroNIDAZOLE (FLAGYL) IVPB 500 mg        500 mg 100 mL/hr over 60 Minutes Intravenous  Once 10/20/20 0551 10/20/20 1035       Objective   Vitals:   10/22/20 0143 10/22/20 0500 10/22/20 0508 10/22/20 1224  BP: 131/85  139/78 126/81  Pulse: (!) 108  91 93  Resp: (!) 24  19 (!) 22  Temp: 100 F (37.8 C)  98.7 F (37.1 C) 99.2 F (37.3 C)  TempSrc: Oral  Oral Oral  SpO2: 100%  100% 97%  Weight:  87.8 kg    Height:  6' (1.829 m)      SpO2: 97 % O2 Flow Rate (L/min): 2 L/min  Wt Readings from Last 3 Encounters:  10/22/20 87.8 kg  08/04/20 81.6 kg  07/21/20 85.9 kg     Intake/Output Summary (Last 24 hours) at 10/22/2020 1723 Last data filed at  10/22/2020 1224 Gross per 24 hour  Intake 2205.56 ml  Output 2950 ml  Net -744.44 ml    Physical Exam:   Awake Alert, Oriented X 3, Normal affect No new F.N deficits,  [place].AT, Normal respiratory effort on room air, CTAB Tachycardic,No Gallops,Rubs or new Murmurs,  +ve B.Sounds, Abd Soft, No tenderness, No rebound, guarding or rigidity. Foley catheter in place Penrose drain in place Exquisite tenderness in perineum and perirectum with slight erythema but no induration or fluctuance      I have personally reviewed the following:   Data Reviewed:  CBC Recent Labs  Lab 10/18/20 1245 10/20/20 0420 10/21/20 0029 10/22/20 0707  WBC 19.4* 26.0* 30.4* 22.4*  HGB 14.4 13.6 12.4* 12.4*  HCT 43.9 41.1 37.9* 38.6*  PLT 275 273 269 264  MCV 81.8 81.1 81.0 82.8  MCH 26.8 26.8 26.5 26.6  MCHC 32.8 33.1 32.7 32.1  RDW 15.0 14.8 14.7 15.0  LYMPHSABS 1.8 1.7  --   --   MONOABS 1.7* 1.9*  --   --   EOSABS 0.0 0.0  --   --   BASOSABS 0.0 0.1  --   --     Chemistries  Recent Labs  Lab 10/18/20 1245 10/20/20 0420 10/21/20 0029 10/22/20 0707  NA 134* 131* 134* 134*  K 3.9 3.8 4.1 3.8  CL 97* 94* 99 98  CO2 24 22 24 25   GLUCOSE 94 152* 145* 96  BUN 9 8 8 9   CREATININE 1.11 0.94 1.03 1.11  CALCIUM 9.3 9.2 8.4* 8.7*  AST 19 15  --   --   ALT 14 13  --   --   ALKPHOS 59 52  --   --   BILITOT 0.7 0.6  --   --    ------------------------------------------------------------------------------------------------------------------ No results for input(s): CHOL, HDL, LDLCALC, TRIG, CHOLHDL, LDLDIRECT in the last 72 hours.  No results found for: HGBA1C ------------------------------------------------------------------------------------------------------------------ No results for input(s): TSH, T4TOTAL, T3FREE, THYROIDAB in the last 72 hours.  Invalid input(s):  FREET3 ------------------------------------------------------------------------------------------------------------------ No results for input(s): VITAMINB12, FOLATE, FERRITIN, TIBC, IRON, RETICCTPCT in the last 72 hours.  Coagulation profile Recent Labs  Lab 10/20/20 0620 10/21/20 0029  INR 1.2 1.3*    No results for input(s): DDIMER in the last 72 hours.  Cardiac Enzymes No results for input(s): CKMB, TROPONINI, MYOGLOBIN in the last 168 hours.  Invalid input(s): CK ------------------------------------------------------------------------------------------------------------------ No results found for: BNP  Micro Results Recent Results (from the past 240 hour(s))  Urine culture     Status: None   Collection Time: 10/20/20  6:00 AM   Specimen: Urine, Clean Catch  Result Value Ref Range Status   Specimen Description   Final    URINE, CLEAN CATCH Performed at [facility], [place] [address] [place], [place] [address]    Special Requests   Final    NONE Performed at [facility], [place] [address] [place], [place] [address]    Culture   Final    NO GROWTH Performed at [facility], [place] [address] [place], [place] [address]    Report Status 10/21/2020 FINAL  Final  Resp Panel by RT-PCR (Flu A&B, Covid) Nasopharyngeal Swab     Status: None   Collection Time: 10/20/20  6:18 AM   Specimen: Nasopharyngeal Swab; Nasopharyngeal(NP) swabs in vial transport medium  Result Value Ref Range Status   SARS Coronavirus 2 by RT PCR NEGATIVE NEGATIVE Final    Comment: (NOTE) SARS-CoV-2 target nucleic acids are NOT DETECTED.  The SARS-CoV-2 RNA is generally detectable in upper respiratory specimens during the acute phase of infection. The lowest concentration of SARS-CoV-2 viral copies this assay can detect is 138 copies/mL. A negative result does not preclude SARS-Cov-2 infection and should not be used as the sole basis for treatment or other  patient management decisions. A negative result may occur with  improper specimen collection/handling, submission of specimen other than nasopharyngeal swab, presence of viral mutation(s) within the areas targeted by this assay, and inadequate number of viral copies(<138 copies/mL). A negative result must be combined with clinical observations, patient history, and epidemiological information. The expected result is Negative.  Fact Sheet for Patients:  EntrepreneurPulse.com.au  Fact Sheet for Healthcare Providers:  IncredibleEmployment.be  This test is no t yet approved or cleared by the [place]  States FDA and  has been authorized for detection and/or diagnosis of SARS-CoV-2 by FDA under an Emergency Use Authorization (EUA). This EUA will remain  in effect (meaning this test can be used) for the duration of the COVID-19 declaration under Section 564(b)(1) of the Act, 21 U.S.C.section 360bbb-3(b)(1), unless the authorization is terminated  or revoked sooner.       Influenza A by PCR NEGATIVE NEGATIVE Final   Influenza B by PCR NEGATIVE NEGATIVE Final    Comment: (NOTE) The Xpert Xpress SARS-CoV-2/FLU/RSV plus assay is intended as an aid in the diagnosis of influenza from Nasopharyngeal swab specimens and should not be used as a sole basis for treatment. Nasal washings and aspirates are unacceptable for Xpert Xpress SARS-CoV-2/FLU/RSV testing.  Fact Sheet for Patients: BloggerCourse.com  Fact Sheet for Healthcare Providers: SeriousBroker.it  This test is not yet approved or cleared by the [place] FDA and has been authorized for detection and/or diagnosis of SARS-CoV-2 by FDA under an Emergency Use Authorization (EUA). This EUA will remain in effect (meaning this test can be used) for the duration of the COVID-19 declaration under Section 564(b)(1) of the Act, 21 U.S.C. section  360bbb-3(b)(1), unless the authorization is terminated or revoked.  Performed at [facility], [address], [place], [place] [address]   Blood culture (routine x 2)     Status: None (Preliminary result)   Collection Time: 10/20/20  6:20 AM   Specimen: BLOOD  Result Value Ref Range Status   Specimen Description   Final    BLOOD LEFT ANTECUBITAL Performed at [facility], [address], [place], [place] [address]    Special Requests   Final    BOTTLES DRAWN AEROBIC ONLY Blood Culture results may not be optimal due to an inadequate volume of blood received in culture bottles Performed at [facility], [address], [place], [place] [address]    Culture   Final    NO GROWTH 2 DAYS Performed at [facility], [address], [place], [place] [address]    Report Status PENDING  Incomplete  Blood culture (routine x 2)     Status: None (Preliminary result)   Collection Time: 10/20/20  8:55 AM   Specimen: BLOOD  Result Value Ref Range Status   Specimen Description   Final    BLOOD RIGHT WRIST Performed at [facility], [address] [place], [place] [address]    Special Requests   Final    BOTTLES DRAWN AEROBIC AND ANAEROBIC Blood Culture results may not be optimal due to an excessive volume of blood received in culture bottles Performed at [facility], [address] [place], [place] [address]    Culture   Final    NO GROWTH 2 DAYS Performed at [facility], [address] [place], [place] [address]    Report Status PENDING  Incomplete  Surgical pcr screen     Status: None   Collection Time: 10/20/20  1:24 PM   Specimen: Nasal Mucosa; Nasal Swab  Result Value Ref Range Status   MRSA, PCR NEGATIVE NEGATIVE Final   Staphylococcus aureus NEGATIVE NEGATIVE Final    Comment: (NOTE) The Xpert SA Assay (FDA approved for NASAL specimens in patients 72 years of age and  older), is one component of a comprehensive surveillance program. It is not intended to diagnose infection nor to guide or monitor treatment. Performed at [facility], [address] [place], [place] [address]   Aerobic/Anaerobic Culture (surgical/deep wound)     Status: None (Preliminary result)   Collection Time: 10/20/20  5:07 PM   Specimen: PATH Soft tissue  Result Value Ref Range Status   Specimen Description PERINEAL ABSCESS  Final   Special Requests DRAINAGE  Final   Gram Stain   Final    MODERATE WBC PRESENT,BOTH PMN AND MONONUCLEAR FEW GRAM POSITIVE COCCI IN PAIRS IN CHAINS MODERATE GRAM NEGATIVE RODS RARE GRAM POSITIVE RODS    Culture   Final    HOLDING FOR POSSIBLE ANAEROBE Performed at [facility], [address] [place], [place] [address]    Report Status PENDING  Incomplete  Aerobic Culture (superficial specimen)     Status: None (Preliminary result)   Collection Time: 10/20/20  5:07 PM   Specimen: Wound  Result Value Ref Range Status   Specimen Description WOUND PERINEAL ABSCESS  Final   Special Requests SWAB  Final   Gram Stain NO WBC SEEN NO ORGANISMS SEEN   Final   Culture   Final    CULTURE REINCUBATED FOR BETTER GROWTH Performed at [facility], [address], [place], [place] [address]    Report Status PENDING  Incomplete  Culture, blood (routine x 2)     Status: None (Preliminary result)   Collection Time: 10/21/20  1:35 PM   Specimen: BLOOD RIGHT HAND  Result Value Ref Range Status   Specimen Description   Final    BLOOD RIGHT HAND Performed at [facility], [address] [place], [place] [address]    Special Requests   Final    BOTTLES DRAWN AEROBIC ONLY Blood Culture adequate volume Performed at [facility], [address], [place], [place] [address]    Culture   Final    NO GROWTH < 24 HOURS Performed at [facility], [address] [place], [place]  [address]    Report Status PENDING  Incomplete  Culture, blood (routine x 2)     Status: None (Preliminary result)   Collection Time: 10/21/20  1:35 PM   Specimen: BLOOD  Result Value Ref Range Status   Specimen Description   Final    BLOOD RIGHT ANTECUBITAL Performed at [facility], [address], [place], [place] [address]    Special Requests   Final    BOTTLES DRAWN AEROBIC AND ANAEROBIC Blood Culture adequate volume Performed at [facility], [address], [place], [place] [address]    Culture   Final    NO GROWTH < 24 HOURS Performed at [facility], [address] [place], [place] [address]    Report Status PENDING  Incomplete    Radiology Reports CT ABDOMEN PELVIS W CONTRAST  Result Date: 10/20/2020 CLINICAL DATA:  Rectal pain, rectal abscess, leukocytosis EXAM: CT ABDOMEN AND PELVIS WITH CONTRAST TECHNIQUE: Multidetector CT imaging of the abdomen and pelvis was performed using the standard protocol following bolus administration of intravenous contrast. CONTRAST:  OMNIPAQUE IOHEXOL 300 MG/ML  SOLN COMPARISON:  07/20/2020, 07/26/2020 FINDINGS: Lower chest: The visualized lung bases are clear bilaterally. The visualized heart and pericardium are unremarkable. Hepatobiliary: No focal liver abnormality is seen. No gallstones, gallbladder wall thickening, or biliary dilatation. Pancreas: Unremarkable Spleen: Unremarkable Adrenals/Urinary Tract: The adrenal glands are unremarkable. The kidneys are normal. The bladder is largely decompressed. Previously noted bladder wall thickening has improved in the interval. There is  a defect seen at the bladder base involving the prostatic urethra in keeping with the given history of trans urethral resection. Stomach/Bowel: The stomach, small bowel, and large bowel are unremarkable. No free intraperitoneal gas or fluid. Vascular/Lymphatic: There is shotty bilateral external iliac lymphadenopathy, likely  reactive in nature. No frankly pathologic adenopathy within the abdomen and pelvis. The vascular structures are unremarkable. Reproductive: As noted above, a defect is seen within the prostate gland in keeping with the given history of trans urethral resection. Other: Just inferior to the prostate gland, surrounding the expected membranous urethra is a recurrent perirectal abscess measuring 3.8 x 4.4 cm at axial image # 80/2. This component above the levator ani appears separate from 2 additional components involving the crus of the corpus cavernosa bilaterally. On the right, this measures 2.0 x 6.6 cm on axial image # 87/2. On the left, this multiloculated collection is less well-defined measuring roughly 1.5 x 4.1 cm at axial image # 86/2. Musculoskeletal: No acute bone abnormality. IMPRESSION: Recurrent multiloculated perirectal abscesses demonstrating components both above the levator ani as well as below the levator involving the crus of the corpus cavernosa bilaterally. Interval changes of trans urethral resection of the prostate with improved bladder wall thickening. Electronically Signed   By: Helyn Numbers MD   On: 10/20/2020 06:46     Time Spent in minutes  30     Laverna Peace M.D on 10/22/2020 at 5:23 PM  To page go to [email or web address] - password [facility]

## 2020-10-22 NOTE — Progress Notes (Signed)
2 Days Post-Op   Subjective/Chief Complaint:  1-  Recurrent peri-prostatic, perineal, penile abscess - smoldering abscess initially managed 07/2020 with recurrence by imaging and exam today involving  base of penis, prostate. Prior CX 07/2020 polymicrobial (e.coli, actinomyces, strep) sens Vanc + Gent. HIV negative, no h/o diabetes or diverticulitis.   Repeat cysto TURP (no intraprostatic abscess), perineal I+D (large peri-prostatic / perineal abscess + drain placement) 10/21/19. Tyler [name], [facility] 10/20/2020 pending. Placed on Vanc + Zosyn. Foley removed 1/6.    Today "Tyler [name]" is improving objectively. Fever curve and WBC now trending down. CX still no data to narrow ABX. Still with significant perineal pain as expected.      Objective: Vital signs in last 24 hours: Temp:  [98.5 F (36.9 C)-101.4 F (38.6 C)] 99.2 F (37.3 C) (01/06 1224) Pulse Rate:  [91-128] 93 (01/06 1224) Resp:  [18-25] 22 (01/06 1224) BP: (120-147)/(67-85) 126/81 (01/06 1224) SpO2:  [97 %-100 %] 97 % (01/06 1224) Weight:  [87.8 kg] 87.8 kg (01/06 0500) Last BM Date: 10/19/20  Intake/Output from previous day: 01/05 0701 - 01/06 0700 In: 3655.5 [P.O.:840; I.V.:2050.6; IV Piggyback:764.9] Out: 3550 [Urine:3550] Intake/Output this shift: Total I/O In: 240 [P.O.:240] Out: 850 [Urine:850]   General appearance: alert, cooperative and very pleasant, at baseline.  Eyes: negative Nose: Nares normal. Septum midline. Mucosa normal. No drainage or sinus tenderness. Throat: lips, mucosa, and tongue normal; teeth and gums normal Neck: supple, symmetrical, trachea midline Back: symmetric, no curvature. ROM normal. No CVA tenderness. Cardio: regular tachycardia GI: soft, non-tender; bowel sounds normal; no masses,  no organomegaly Male genitalia: less penile base and perineal induration. Perineal penrose in expected position to woudn drainage with scant output. Foley in place wtih clear yellow urien. REmoved.  Pulses: 2+  and symmetric Lymph nodes: Cervical, supraclavicular, and axillary nodes normal. Neurologic: Grossly normal  Lab Results:  Recent Labs    10/21/20 0029 10/22/20 0707  WBC 30.4* 22.4*  HGB 12.4* 12.4*  HCT 37.9* 38.6*  PLT 269 264   BMET Recent Labs    10/21/20 0029 10/22/20 0707  NA 134* 134*  K 4.1 3.8  CL 99 98  CO2 24 25  GLUCOSE 145* 96  BUN 8 9  CREATININE 1.03 1.11  CALCIUM 8.4* 8.7*   PT/INR Recent Labs    10/20/20 0620 10/21/20 0029  LABPROT 14.6 15.4*  INR 1.2 1.3*   ABG No results for input(s): PHART, HCO3 in the last 72 hours.  Invalid input(s): PCO2, PO2  Studies/Results: No results found.  Anti-infectives: Anti-infectives (From admission, onward)   Start     Dose/Rate Route Frequency Ordered Stop   10/20/20 2200  vancomycin (VANCOREADY) IVPB 1500 mg/300 mL        1,500 mg 150 mL/hr over 120 Minutes Intravenous Every 12 hours 10/20/20 1017     10/20/20 1800  vancomycin (VANCOREADY) IVPB 1500 mg/300 mL  Status:  Discontinued        1,500 mg 150 mL/hr over 120 Minutes Intravenous Every 12 hours 10/20/20 1015 10/20/20 1017   10/20/20 1400  piperacillin-tazobactam (ZOSYN) IVPB 3.375 g        3.375 g 12.5 mL/hr over 240 Minutes Intravenous Every 8 hours 10/20/20 1015     10/20/20 1200  vancomycin (VANCOREADY) IVPB 500 mg/100 mL        500 mg 100 mL/hr over 60 Minutes Intravenous  Once 10/20/20 1017 10/20/20 1620   10/20/20 0615  ceFEPIme (MAXIPIME) 2 g in sodium chloride 0.9 %  100 mL IVPB        2 g 200 mL/hr over 30 Minutes Intravenous STAT 10/20/20 0609 10/20/20 0936   10/20/20 0615  vancomycin (VANCOREADY) IVPB 1500 mg/300 mL        1,500 mg 150 mL/hr over 120 Minutes Intravenous STAT 10/20/20 0610 10/20/20 0815   10/20/20 0600  metroNIDAZOLE (FLAGYL) IVPB 500 mg        500 mg 100 mL/hr over 60 Minutes Intravenous  Once 10/20/20 0551 10/20/20 1035      Assessment/Plan:  1-  Recurrent peri-prostatic, perineal, penile abscess - now s/p  aggressive surgical debridement. Transient worsening of SIRS/inrectious parameters now trending better as expected. .   Agree with current ABX pending additional CX data.  Keep foley out, but continue perineal wound drain for now.  Greatly appreciate medical team comanagement.   Please call me directly anytime to discuss.   Sebastian Ache 10/22/2020

## 2020-10-22 NOTE — Plan of Care (Signed)
  Problem: Health Behavior/Discharge Planning: Goal: Ability to manage health-related needs will improve Outcome: Progressing   Problem: Clinical Measurements: Goal: Will remain free from infection Outcome: Progressing Goal: Cardiovascular complication will be avoided Outcome: Progressing   

## 2020-10-22 NOTE — Plan of Care (Signed)
  Problem: Health Behavior/Discharge Planning: Goal: Ability to manage health-related needs will improve Outcome: Progressing   Problem: Clinical Measurements: Goal: Will remain free from infection Outcome: Progressing   Problem: Activity: Goal: Risk for activity intolerance will decrease Outcome: Progressing   Problem: Pain Managment: Goal: General experience of comfort will improve Outcome: Progressing   Problem: Safety: Goal: Ability to remain free from injury will improve Outcome: Progressing   Problem: Skin Integrity: Goal: Risk for impaired skin integrity will decrease Outcome: Progressing   

## 2020-10-22 NOTE — Op Note (Signed)
NAMEHUBERT, RAATZ MEDICAL RECORD [id number] ACCOUNT [id number] DATE OF BIRTH:10-03-1986 FACILITY: [facility] LOCATION: [facility] PHYSICIAN:Tomi Paddock, MD  OPERATIVE REPORT  DATE OF PROCEDURE:  10/20/2020  PREOPERATIVE DIAGNOSIS:  Recurrent severe perineal and periprostatic abscess.  PROCEDURE: 1.  Cystoscopy with transection of prostate regrowth. 2.  Incision and drainage of complex perineal abscess.  ESTIMATED BLOOD LOSS:  20 mL.  COMPLICATIONS:  None.  SPECIMENS: 1.  Perineal abscess culture swab. 2.  Perineal abscess fluid both for Gram stain and culture.  FINDINGS: 1.  No evidence of intraprostatic abscess by transurethral resection. 2.  Large purulent fluid collection in the periurethral and periprostatic space, estimated 60 mL total. 3.  Successful placement of periurethral and periprostatic Penrose drain via perineal approach. 4.  No evidence of direct urethral or rectal connection to abscess.  INDICATIONS:  The patient is a very pleasant but unfortunate 35 year old man with history of a complex perineal and periprostatic infection approximately 3 months ago.  This was managed with IR transperineal drainage.  His cultures at that time were  equivocal.  He eventually cleared systemic infectious parameters; however, he has noted for the past month recurrence of irritative voiding, perineal pain which has become severe along with tachycardia and fevers.  He underwent CT scan today in the  Emergency Room that revealed recurrence of large complex perineal and questionable prostatic abscess.  Options were discussed including recommended path of operative I and D, transurethral resection and he wished to proceed.  Informed consent was  obtained and placed in medical record.  PROCEDURE IN DETAIL:  The patient being himself, the procedure being transection of the prostate versus transurethral unroofing of abscess and incision and drainage of perineal abscess was confirmed.   Procedure timeout was performed.  Intravenous  antibiotics administered.  General endotracheal anesthesia induced.  The patient was placed into a medium lithotomy position.  Sterile field was created, prepped and draped base of the penis, perineum, proximal thighs, lower abdomen, buttocks area using  iodine.  Next, cystourethroscopy was performed using a 21-French rigid cystoscope with offset lens.  Inspection of anterior and posterior urethra revealed a prior TUR defect with some mild undermining of the bladder neck as necessary.  No evidence of  obvious  urine or intraluminal infection.  Given the preoperative imaging, it was felt that there is significant suspicion of intraprostatic abscess and as such, very careful transurethral resection was performed in the posterior plane proximal to  the verumontanum down to what appeared to be the superficial fibromuscular layer of the prostatic capsule.  This confirmed no evidence of intraprostatic abscess whatsoever.  This is a similar finding to prior procedure as per prior notes.  Point  coagulation current resulted in excellent hemostasis.  Prostate chips were irrigated and set aside for discard.  Given the necessary posterior resection, a sensor wire was placed into the urinary bladder to avoid undermining the bladder with catheter  over which a new 22-French 3-way Foley catheter was carefully placed, 20 mL around the balloon.  This resulted in normal saline irrigation, efflux was very light pink at 1 drop per second.  As the abscess cavity had not been successfully reached  intraluminally per urethra, this ruling out injury to the abscess, it was felt that likely the true abscess cavity was likely periprostatic and perineal.  As such incision was made approximately 4 cm in length on the area of maximum induration to the  area of the perineum with the inferior aspect still remaining approximately 2.5  cm anterior in to the anus.  We then proceeded down through  very indurated and edematous tissue towards the area of the deep perineum.  Using surgeon's finger, the pubic  bones, inferior pubic rami were carefully palpated.  There was still an area of induration in the midline just inferior to the suspected wall of the urethra.  A large bore  syringe was used to aspirate the area and this corroborated location of large  abscess and approximately 40 mL of very purulent fluid was immediately aspirated and set aside for Gram stain and culture.  This area of abscess tract was then opened slightly more via scalpel and then probed with surgeon's finger and this definitely  was the true abscess cavity.  Fortunately this tract, periprostatic, periurethral, but not intraluminal of the urethra tracked posteriorly towards the area of prerectal fat, but not into the rectum proper.  This was consistent with CT imaging.  It was  felt that we had successfully interrogated aspects of the accessible cavity.  This was flushed copiously via Toomey syringe.  A small counter incision was made lateral to the right perineal incision, 1/4-inch in length and a 1/4-inch Penrose drain was  placed to the deepest aspect of the abscess cavity via this site and anchored in place with nylon suture.  The perineal skin incision was reapproximated using very loosely interrupted mattress Prolene x3, which resulted in excellent skin reapproximation.   We achieved the goals of the procedure today with excellent hemostasis.  Sponge, needle counts were correct.  Dressing of ABD pads and mesh underwear was placed.  The procedure was terminated.  The patient tolerated the procedure well.  No immediate  complications.  The patient was taken to postanesthesia care in stable condition.  Plan for continued inpatient admission with IV antibiotics, at least until he clears his infectious parameters and culture results available.  IN/NUANCE  D:10/20/2020 T:10/21/2020 JOB:[id number]

## 2020-10-23 LAB — BASIC METABOLIC PANEL
Anion gap: 10 (ref 5–15)
BUN: 10 mg/dL (ref 6–20)
CO2: 26 mmol/L (ref 22–32)
Calcium: 8.8 mg/dL — ABNORMAL LOW (ref 8.9–10.3)
Chloride: 99 mmol/L (ref 98–111)
Creatinine, Ser: 1.13 mg/dL (ref 0.61–1.24)
GFR, Estimated: >60 mL/min (ref >60)
Glucose, Bld: 109 mg/dL — ABNORMAL HIGH (ref 70–99)
Potassium: 4.2 mmol/L (ref 3.5–5.1)
Sodium: 135 mmol/L (ref 135–145)

## 2020-10-23 LAB — AEROBIC CULTURE W GRAM STAIN (SUPERFICIAL SPECIMEN)
Culture: NORMAL
Gram Stain: NONE SEEN

## 2020-10-23 LAB — CBC
HCT: 40 % (ref 39.0–52.0)
Hemoglobin: 13 g/dL (ref 13.0–17.0)
MCH: 27.1 pg (ref 26.0–34.0)
MCHC: 32.5 g/dL (ref 30.0–36.0)
MCV: 83.3 fL (ref 80.0–100.0)
Platelets: 313 10*3/uL (ref 150–400)
RBC: 4.8 MIL/uL (ref 4.22–5.81)
RDW: 14.7 % (ref 11.5–15.5)
WBC: 11.7 10*3/uL — ABNORMAL HIGH (ref 4.0–10.5)
nRBC: 0 % (ref 0.0–0.2)

## 2020-10-23 LAB — GLUCOSE, CAPILLARY: Glucose-Capillary: 106 mg/dL — ABNORMAL HIGH (ref 70–99)

## 2020-10-23 NOTE — Progress Notes (Signed)
3 Days Post-Op   Subjective/Chief Complaint:  1-  Recurrent peri-prostatic, perineal, penile abscess - smoldering abscess initially managed 07/2020 with recurrence by imaging and exam 10/2020 involving  base of penis, prostate. Prior CX 07/2020 polymicrobial (e.coli, actinomyces, strep) sens Vanc + Gent. HIV negative, no h/o diabetes or diverticulitis.   Repeat cysto TURP (no intraprostatic abscess), perineal I+D (large peri-prostatic / perineal abscess + drain placement) 10/21/19. [name], [facility] 10/20/2020 pending. Placed on Vanc + Zosyn. Foley removed 1/6.    Today "Tyler [name]" is improving objectively but still remains with difficult to manage perineal pain. Fever and WBC curve continue to improve. CX thus far scant / skin flora despite large volume purlulent fluid.    Objective: Vital signs in last 24 hours: Temp:  [98.4 F (36.9 C)-100.3 F (37.9 C)] 98.4 F (36.9 C) (01/07 1330) Pulse Rate:  [90-103] 90 (01/07 1330) Resp:  [18-19] 19 (01/07 1330) BP: (124-131)/(74-85) 124/74 (01/07 1330) SpO2:  [96 %-98 %] 98 % (01/07 1330) Weight:  [88.6 kg] 88.6 kg (01/07 0505) Last BM Date: 10/19/20  Intake/Output from previous day: 01/06 0701 - 01/07 0700 In: 1592.7 [P.O.:480; I.V.:352.9; IV Piggyback:759.8] Out: 2775 [Urine:2775] Intake/Output this shift: Total I/O In: -  Out: 1650 [Urine:1650]   General appearance: alert, cooperative and very pleasant, at baseline.  Eyes: negative Nose: Nares normal. Septum midline. Mucosa normal. No drainage or sinus tenderness. Throat: lips, mucosa, and tongue normal; teeth and gums normal Neck: supple, symmetrical, trachea midline Back: symmetric, no curvature. ROM normal. No CVA tenderness. Cardio: regular tachycardia GI: soft, non-tender; bowel sounds normal; no masses,  no organomegaly Male genitalia: less penile base and perineal induration, but some still present. Perineal penrose in expected position to woudn drainage with scant output. Clear  urine in bedside urinal.   Pulses: 2+ and symmetric Lymph nodes: Cervical, supraclavicular, and axillary nodes normal. Neurologic: Grossly normal  Lab Results:  Recent Labs    10/22/20 0707 10/23/20 0606  WBC 22.4* 11.7*  HGB 12.4* 13.0  HCT 38.6* 40.0  PLT 264 313   BMET Recent Labs    10/22/20 0707 10/23/20 0606  NA 134* 135  K 3.8 4.2  CL 98 99  CO2 25 26  GLUCOSE 96 109*  BUN 9 10  CREATININE 1.11 1.13  CALCIUM 8.7* 8.8*   PT/INR Recent Labs    10/21/20 0029  LABPROT 15.4*  INR 1.3*   ABG No results for input(s): PHART, HCO3 in the last 72 hours.  Invalid input(s): PCO2, PO2  Studies/Results: No results found.  Anti-infectives: Anti-infectives (From admission, onward)   Start     Dose/Rate Route Frequency Ordered Stop   10/20/20 2200  vancomycin (VANCOREADY) IVPB 1500 mg/300 mL        1,500 mg 150 mL/hr over 120 Minutes Intravenous Every 12 hours 10/20/20 1017     10/20/20 1800  vancomycin (VANCOREADY) IVPB 1500 mg/300 mL  Status:  Discontinued        1,500 mg 150 mL/hr over 120 Minutes Intravenous Every 12 hours 10/20/20 1015 10/20/20 1017   10/20/20 1400  piperacillin-tazobactam (ZOSYN) IVPB 3.375 g        3.375 g 12.5 mL/hr over 240 Minutes Intravenous Every 8 hours 10/20/20 1015     10/20/20 1200  vancomycin (VANCOREADY) IVPB 500 mg/100 mL        500 mg 100 mL/hr over 60 Minutes Intravenous  Once 10/20/20 1017 10/20/20 1620   10/20/20 0615  ceFEPIme (MAXIPIME) 2 g in sodium  chloride 0.9 % 100 mL IVPB        2 g 200 mL/hr over 30 Minutes Intravenous STAT 10/20/20 0609 10/20/20 0936   10/20/20 0615  vancomycin (VANCOREADY) IVPB 1500 mg/300 mL        1,500 mg 150 mL/hr over 120 Minutes Intravenous STAT 10/20/20 0610 10/20/20 0815   10/20/20 0600  metroNIDAZOLE (FLAGYL) IVPB 500 mg        500 mg 100 mL/hr over 60 Minutes Intravenous  Once 10/20/20 0551 10/20/20 1035      Assessment/Plan:  1-  Recurrent peri-prostatic, perineal, penile  abscess - now s/p aggressive surgical debridement.  As his discomfort remain out of proportion to exam / objetive findings, will consider repeat CT pelvis and/or repeat OR debridement likely early next week before DC home.   Agree with current ABX pending additional CX data.  Keep foley out, but continue perineal wound drain for now.  Greatly appreciate medical team comanagement.   Please call me directly anytime to discuss.   Sebastian Ache 10/23/2020

## 2020-10-23 NOTE — Plan of Care (Signed)
  Problem: Education: Goal: Knowledge of General Education information will improve Description: Including pain rating scale, medication(s)/side effects and non-pharmacologic comfort measures Outcome: Progressing   Problem: Clinical Measurements: Goal: Will remain free from infection Outcome: Progressing Goal: Diagnostic test results will improve Outcome: Progressing   Problem: Coping: Goal: Level of anxiety will decrease Outcome: Progressing   Problem: Safety: Goal: Ability to remain free from injury will improve Outcome: Progressing   Problem: Activity: Goal: Risk for activity intolerance will decrease Outcome: Not Progressing

## 2020-10-23 NOTE — Progress Notes (Signed)
[facility]  PROGRESS NOTE  Tyler [name] WPY:[id number] DOB: 03-19-1986 DOA: 10/20/2020 PCP: Patient, No Pcp Per Admit date - 10/20/2020   Admitting Physician Jae Dire, MD  Outpatient Primary MD for the patient is Patient, No Pcp Per  LOS - 3 Brief Narrative   Tyler [name] is a 35 y.o. year old male with medical history significant for Recurrent prostate abscess (he has had 2 prior admissions: 10/4-10/8 with unsuccessful transurethral moving followed by transrectal drain placement with wound cultures growing strep angina gnosis, followed by hospitalization 10/19-2010/25 for sepsis due to perineal/right scrotal abscess requiring I&D).  During last hospitalization patient underwent cystourethroscopy and I&D of abscess on 10/19 completed a week of empiric IV vancomycin, cefepime and Flagyl, blood cultures remain unremarkable at that time and abscess culture showed mixed anaerobic flora and was discharged 2 to 3 weeks of oral Augmentin.  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  He is admitted with working diagnosis sepsis secondary to recurrent multiloculated perirectal abscesses, given his history of polymicrobial abscesses he was started on empiric vancomycin and Zosyn and underwent operative I&D of perineal abscess by urology as well as transurethral resection of the prostate abscess on 1/4.  Hospital course complicated by persistent tachycardia slowly downtrending fever and persistent perineal/prostate pain.    Subjective  Reports persistent worsening of posterior/buttocks/rectum pain  A & P   Sepsis secondary to recurrent prostate and perineal abscess, status post TURP abscess and I&D of perineal abscess on 1/4 improving. The patient defervesced, minimal tachycardic, sepsis physiology  essentially resolved.  The patient still having a lot of exquisite pain directly in the posterior perineal area still no growth on blood Intraoperative cultures.  On examination no appreciable fluctuance or induration to suggest abscess -monitor admission and repeat blood cultures from 1/5 blood cultures - GPC/GNR's in the surgical wound culture, awaiting further results -continue vancomycin, Zosyn - Appreciate urology assistance, managing perineal Penrose drainage for perineal wound, Foley catheter removed - Repeat CBC in a.m. -Given exquisite tenderness out of proportion to lab parameters improvement may warrant repeat CT abdomen imaging or repeat urologic intervention, will continue monitor -Pain control IV morphine as needed severe pain, add Tylenol for mild, oxycodone for moderate as needed pain.  Optimize bowel regimen  Recurrent prostate/perineal abscesses, unclear risk factors.  Patient has no known history of immunosuppression.  Last HIV test in October was negative.  HIV antibody and GC chlamydia both negative  Tobacco use - Nicotine patch in place - Cessation emphasized on admission     Family Communication  : None  Code Status : Full  Disposition Plan  :  Patient is from home. Anticipated d/c date: 2 to 3 days. Barriers to d/c or necessity for inpatient status:  Still requiring.  IV antibiotics, IV pain control for significant perineal pain Consults  : Urology  Procedures  : TURP and I&D of perineal abscess on 1/4  DVT Prophylaxis  : Heparin MDM: The below labs and imaging reports were reviewed and summarized above.  Medication management as above.  Lab Results  Component Value Date   PLT 313 10/23/2020    Diet :  Diet Order            Diet regular Room service appropriate? Yes; Fluid consistency: Thin  Diet effective now  Inpatient Medications Scheduled Meds: . Chlorhexidine Gluconate Cloth  6 each Topical Daily  . nicotine  21 mg  Transdermal Daily  . senna-docusate  2 tablet Oral BID   Continuous Infusions: . lactated ringers    . piperacillin-tazobactam (ZOSYN)  IV 3.375 g (10/23/20 1635)  . vancomycin 1,500 mg (10/23/20 1230)   PRN Meds:.acetaminophen, lip balm, morphine injection, ondansetron **OR** ondansetron (ZOFRAN) IV, oxyCODONE, polyethylene glycol  Antibiotics  :   Anti-infectives (From admission, onward)   Start     Dose/Rate Route Frequency Ordered Stop   10/20/20 2200  vancomycin (VANCOREADY) IVPB 1500 mg/300 mL        1,500 mg 150 mL/hr over 120 Minutes Intravenous Every 12 hours 10/20/20 1017     10/20/20 1800  vancomycin (VANCOREADY) IVPB 1500 mg/300 mL  Status:  Discontinued        1,500 mg 150 mL/hr over 120 Minutes Intravenous Every 12 hours 10/20/20 1015 10/20/20 1017   10/20/20 1400  piperacillin-tazobactam (ZOSYN) IVPB 3.375 g        3.375 g 12.5 mL/hr over 240 Minutes Intravenous Every 8 hours 10/20/20 1015     10/20/20 1200  vancomycin (VANCOREADY) IVPB 500 mg/100 mL        500 mg 100 mL/hr over 60 Minutes Intravenous  Once 10/20/20 1017 10/20/20 1620   10/20/20 0615  ceFEPIme (MAXIPIME) 2 g in sodium chloride 0.9 % 100 mL IVPB        2 g 200 mL/hr over 30 Minutes Intravenous STAT 10/20/20 0609 10/20/20 0936   10/20/20 0615  vancomycin (VANCOREADY) IVPB 1500 mg/300 mL        1,500 mg 150 mL/hr over 120 Minutes Intravenous STAT 10/20/20 0610 10/20/20 0815   10/20/20 0600  metroNIDAZOLE (FLAGYL) IVPB 500 mg        500 mg 100 mL/hr over 60 Minutes Intravenous  Once 10/20/20 0551 10/20/20 1035       Objective   Vitals:   10/22/20 1224 10/22/20 2055 10/23/20 0505 10/23/20 1330  BP: 126/81 129/85 131/75 124/74  Pulse: 93 (!) 103 90 90  Resp: (!) 22 18 19 19   Temp: 99.2 F (37.3 C) 100.3 F (37.9 C) 98.5 F (36.9 C) 98.4 F (36.9 C)  TempSrc: Oral Oral Oral Oral  SpO2: 97% 96% 97% 98%  Weight:   88.6 kg   Height:        SpO2: 98 % O2 Flow Rate (L/min): 2 L/min  Wt  Readings from Last 3 Encounters:  10/23/20 88.6 kg  08/04/20 81.6 kg  07/21/20 85.9 kg     Intake/Output Summary (Last 24 hours) at 10/23/2020 1934 Last data filed at 10/23/2020 1745 Gross per 24 hour  Intake 1121.41 ml  Output 3550 ml  Net -2428.59 ml    Physical Exam:   Awake Alert, Oriented X 3, Normal affect No new F.N deficits,  Gordon.AT, Normal respiratory effort on room air, CTAB Regular rate regular rhythm,No Gallops,Rubs or new Murmurs,  +ve B.Sounds, Abd Soft, No tenderness, No rebound, guarding or rigidity. Penrose drain in place Exquisite tenderness in perineum and perirectum with slight erythema but no induration or fluctuance noted      I have personally reviewed the following:   Data Reviewed:  CBC Recent Labs  Lab 10/18/20 1245 10/20/20 0420 10/21/20 0029 10/22/20 0707 10/23/20 0606  WBC 19.4* 26.0* 30.4* 22.4* 11.7*  HGB 14.4 13.6 12.4* 12.4* 13.0  HCT 43.9 41.1 37.9* 38.6* 40.0  PLT 275 273 269 264  313  MCV 81.8 81.1 81.0 82.8 83.3  MCH 26.8 26.8 26.5 26.6 27.1  MCHC 32.8 33.1 32.7 32.1 32.5  RDW 15.0 14.8 14.7 15.0 14.7  LYMPHSABS 1.8 1.7  --   --   --   MONOABS 1.7* 1.9*  --   --   --   EOSABS 0.0 0.0  --   --   --   BASOSABS 0.0 0.1  --   --   --     Chemistries  Recent Labs  Lab 10/18/20 1245 10/20/20 0420 10/21/20 0029 10/22/20 0707 10/23/20 0606  NA 134* 131* 134* 134* 135  K 3.9 3.8 4.1 3.8 4.2  CL 97* 94* 99 98 99  CO2 24 22 24 25 26   GLUCOSE 94 152* 145* 96 109*  BUN 9 8 8 9 10   CREATININE 1.11 0.94 1.03 1.11 1.13  CALCIUM 9.3 9.2 8.4* 8.7* 8.8*  AST 19 15  --   --   --   ALT 14 13  --   --   --   ALKPHOS 59 52  --   --   --   BILITOT 0.7 0.6  --   --   --    ------------------------------------------------------------------------------------------------------------------ No results for input(s): CHOL, HDL, LDLCALC, TRIG, CHOLHDL, LDLDIRECT in the last 72 hours.  No results found for:  HGBA1C ------------------------------------------------------------------------------------------------------------------ No results for input(s): TSH, T4TOTAL, T3FREE, THYROIDAB in the last 72 hours.  Invalid input(s): FREET3 ------------------------------------------------------------------------------------------------------------------ No results for input(s): VITAMINB12, FOLATE, FERRITIN, TIBC, IRON, RETICCTPCT in the last 72 hours.  Coagulation profile Recent Labs  Lab 10/20/20 0620 10/21/20 0029  INR 1.2 1.3*    No results for input(s): DDIMER in the last 72 hours.  Cardiac Enzymes No results for input(s): CKMB, TROPONINI, MYOGLOBIN in the last 168 hours.  Invalid input(s): CK ------------------------------------------------------------------------------------------------------------------ No results found for: BNP  Micro Results Recent Results (from the past 240 hour(s))  Urine culture     Status: None   Collection Time: 10/20/20  6:00 AM   Specimen: Urine, Clean Catch  Result Value Ref Range Status   Specimen Description   Final    URINE, CLEAN CATCH Performed at [facility], [place] [address], [place], [place] [address]    Special Requests   Final    NONE Performed at [facility], [place] [address], [place], [place] [address]    Culture   Final    NO GROWTH Performed at [facility], [place] [address], [place], [place] [address]    Report Status 10/21/2020 FINAL  Final  Resp Panel by RT-PCR (Flu A&B, Covid) Nasopharyngeal Swab     Status: None   Collection Time: 10/20/20  6:18 AM   Specimen: Nasopharyngeal Swab; Nasopharyngeal(NP) swabs in vial transport medium  Result Value Ref Range Status   SARS Coronavirus 2 by RT PCR NEGATIVE NEGATIVE Final    Comment: (NOTE) SARS-CoV-2 target nucleic acids are NOT DETECTED.  The SARS-CoV-2 RNA is generally detectable in upper respiratory specimens during the acute phase of  infection. The lowest concentration of SARS-CoV-2 viral copies this assay can detect is 138 copies/mL. A negative result does not preclude SARS-Cov-2 infection and should not be used as the sole basis for treatment or other patient management decisions. A negative result may occur with  improper specimen collection/handling, submission of specimen other than nasopharyngeal swab, presence of viral mutation(s) within the areas targeted by this assay, and inadequate number of viral copies(<138 copies/mL). A  negative result must be combined with clinical observations, patient history, and epidemiological information. The expected result is Negative.  Fact Sheet for Patients:  BloggerCourse.com  Fact Sheet for Healthcare Providers:  SeriousBroker.it  This test is no t yet approved or cleared by the [place] FDA and  has been authorized for detection and/or diagnosis of SARS-CoV-2 by FDA under an Emergency Use Authorization (EUA). This EUA will remain  in effect (meaning this test can be used) for the duration of the COVID-19 declaration under Section 564(b)(1) of the Act, 21 U.S.C.section 360bbb-3(b)(1), unless the authorization is terminated  or revoked sooner.       Influenza A by PCR NEGATIVE NEGATIVE Final   Influenza B by PCR NEGATIVE NEGATIVE Final    Comment: (NOTE) The Xpert Xpress SARS-CoV-2/FLU/RSV plus assay is intended as an aid in the diagnosis of influenza from Nasopharyngeal swab specimens and should not be used as a sole basis for treatment. Nasal washings and aspirates are unacceptable for Xpert Xpress SARS-CoV-2/FLU/RSV testing.  Fact Sheet for Patients: BloggerCourse.com  Fact Sheet for Healthcare Providers: SeriousBroker.it  This test is not yet approved or cleared by the [place] FDA and has been authorized for detection and/or diagnosis of SARS-CoV-2  by FDA under an Emergency Use Authorization (EUA). This EUA will remain in effect (meaning this test can be used) for the duration of the COVID-19 declaration under Section 564(b)(1) of the Act, 21 U.S.C. section 360bbb-3(b)(1), unless the authorization is terminated or revoked.  Performed at [facility], [address] [place], [place] [address]   Blood culture (routine x 2)     Status: None (Preliminary result)   Collection Time: 10/20/20  6:20 AM   Specimen: BLOOD  Result Value Ref Range Status   Specimen Description   Final    BLOOD LEFT ANTECUBITAL Performed at [facility], [address], [place], [place] [address]    Special Requests   Final    BOTTLES DRAWN AEROBIC ONLY Blood Culture results may not be optimal due to an inadequate volume of blood received in culture bottles Performed at [facility], [address] [place], [place] [address]    Culture   Final    NO GROWTH 2 DAYS Performed at [facility], [address] [place], [place] [address]    Report Status PENDING  Incomplete  Blood culture (routine x 2)     Status: None (Preliminary result)   Collection Time: 10/20/20  8:55 AM   Specimen: BLOOD  Result Value Ref Range Status   Specimen Description   Final    BLOOD RIGHT WRIST Performed at [facility], [address] [place], [place] [address]    Special Requests   Final    BOTTLES DRAWN AEROBIC AND ANAEROBIC Blood Culture results may not be optimal due to an excessive volume of blood received in culture bottles Performed at [facility], [address], [place], [place] [address]    Culture   Final    NO GROWTH 2 DAYS Performed at [facility], [address] [place], [place] [address]    Report Status PENDING  Incomplete  Surgical pcr screen     Status: None   Collection Time: 10/20/20  1:24 PM   Specimen: Nasal Mucosa; Nasal Swab  Result  Value Ref Range Status   MRSA, PCR NEGATIVE NEGATIVE Final   Staphylococcus aureus NEGATIVE NEGATIVE Final    Comment: (  NOTE) The Xpert SA Assay (FDA approved for NASAL specimens in patients 33 years of age and older), is one component of a comprehensive surveillance program. It is not intended to diagnose infection nor to guide or monitor treatment. Performed at [facility], [address] [place], [place] [address]   Aerobic/Anaerobic Culture (surgical/deep wound)     Status: None (Preliminary result)   Collection Time: 10/20/20  5:07 PM   Specimen: PATH Soft tissue  Result Value Ref Range Status   Specimen Description PERINEAL ABSCESS  Final   Special Requests DRAINAGE  Final   Gram Stain   Final    MODERATE WBC PRESENT,BOTH PMN AND MONONUCLEAR FEW GRAM POSITIVE COCCI IN PAIRS IN CHAINS MODERATE GRAM NEGATIVE RODS RARE GRAM POSITIVE RODS    Culture   Final    HOLDING FOR POSSIBLE ANAEROBE Performed at [facility], [address] [place], [place] [address]    Report Status PENDING  Incomplete  Aerobic Culture (superficial specimen)     Status: None   Collection Time: 10/20/20  5:07 PM   Specimen: Wound  Result Value Ref Range Status   Specimen Description WOUND PERINEAL ABSCESS  Final   Special Requests SWAB  Final   Gram Stain NO WBC SEEN NO ORGANISMS SEEN   Final   Culture   Final    FEW NORMAL SKIN FLORA Performed at [facility], [address] [place], [place] [address]    Report Status 10/23/2020 FINAL  Final  Culture, blood (routine x 2)     Status: None (Preliminary result)   Collection Time: 10/21/20  1:35 PM   Specimen: BLOOD RIGHT HAND  Result Value Ref Range Status   Specimen Description   Final    BLOOD RIGHT HAND Performed at [facility], [name] [place], [place] [address]    Special Requests   Final    BOTTLES DRAWN AEROBIC ONLY Blood Culture adequate volume Performed at [facility], [address] [place], [place] [address]    Culture   Final    NO GROWTH < 24 HOURS Performed at [facility], [address] [place], [place] [address]    Report Status PENDING  Incomplete  Culture, blood (routine x 2)     Status: None (Preliminary result)   Collection Time: 10/21/20  1:35 PM   Specimen: BLOOD  Result Value Ref Range Status   Specimen Description   Final    BLOOD RIGHT ANTECUBITAL Performed at [facility], [address] [place], [place] [address]    Special Requests   Final    BOTTLES DRAWN AEROBIC AND ANAEROBIC Blood Culture adequate volume Performed at [facility], [address] [place], [place] [address]    Culture   Final    NO GROWTH < 24 HOURS Performed at [facility], [address] [place], [place] [address]    Report Status PENDING  Incomplete    Radiology Reports CT ABDOMEN PELVIS W CONTRAST  Result Date: 10/20/2020 CLINICAL DATA:  Rectal pain, rectal abscess, leukocytosis EXAM: CT ABDOMEN AND PELVIS WITH CONTRAST TECHNIQUE: Multidetector CT imaging of the abdomen and pelvis was performed using the standard protocol following bolus administration of intravenous contrast. CONTRAST:  100mL OMNIPAQUE IOHEXOL 300 MG/ML  SOLN COMPARISON:  07/20/2020, 07/26/2020 FINDINGS: Lower chest: The visualized lung bases are clear bilaterally. The visualized heart and pericardium are unremarkable. Hepatobiliary: No focal  liver abnormality is seen. No gallstones, gallbladder wall thickening, or biliary dilatation. Pancreas: Unremarkable Spleen: Unremarkable Adrenals/Urinary Tract: The adrenal glands are unremarkable. The kidneys are normal. The bladder is largely decompressed. Previously noted bladder wall thickening has improved in the interval. There is a defect seen at the bladder base involving the prostatic urethra in keeping with the given history of trans urethral resection. Stomach/Bowel:  The stomach, small bowel, and large bowel are unremarkable. No free intraperitoneal gas or fluid. Vascular/Lymphatic: There is shotty bilateral external iliac lymphadenopathy, likely reactive in nature. No frankly pathologic adenopathy within the abdomen and pelvis. The vascular structures are unremarkable. Reproductive: As noted above, a defect is seen within the prostate gland in keeping with the given history of trans urethral resection. Other: Just inferior to the prostate gland, surrounding the expected membranous urethra is a recurrent perirectal abscess measuring 3.8 x 4.4 cm at axial image # 80/2. This component above the levator ani appears separate from 2 additional components involving the crus of the corpus cavernosa bilaterally. On the right, this measures 2.0 x 6.6 cm on axial image # 87/2. On the left, this multiloculated collection is less well-defined measuring roughly 1.5 x 4.1 cm at axial image # 86/2. Musculoskeletal: No acute bone abnormality. IMPRESSION: Recurrent multiloculated perirectal abscesses demonstrating components both above the levator ani as well as below the levator involving the crus of the corpus cavernosa bilaterally. Interval changes of trans urethral resection of the prostate with improved bladder wall thickening. Electronically Signed   By: Helyn Numbers MD   On: 10/20/2020 06:46     Time Spent in minutes  30     Laverna Peace M.D on 10/23/2020 at 7:34 PM  To page go to [email or web address] - password [email or web address]

## 2020-10-23 NOTE — Plan of Care (Signed)
  Problem: Education: Goal: Knowledge of General Education information will improve Description: Including pain rating scale, medication(s)/side effects and non-pharmacologic comfort measures Outcome: Progressing   Problem: Clinical Measurements: Goal: Ability to maintain clinical measurements within normal limits will improve Outcome: Progressing Goal: Cardiovascular complication will be avoided Outcome: Progressing   Problem: Elimination: Goal: Will not experience complications related to urinary retention Outcome: Progressing   Problem: Safety: Goal: Ability to remain free from injury will improve Outcome: Progressing

## 2020-10-24 DIAGNOSIS — R102 Pelvic and perineal pain: Secondary | ICD-10-CM

## 2020-10-24 LAB — BASIC METABOLIC PANEL
Anion gap: 9 (ref 5–15)
BUN: 11 mg/dL (ref 6–20)
CO2: 25 mmol/L (ref 22–32)
Calcium: 8.9 mg/dL (ref 8.9–10.3)
Chloride: 101 mmol/L (ref 98–111)
Creatinine, Ser: 0.96 mg/dL (ref 0.61–1.24)
GFR, Estimated: >60 mL/min (ref >60)
Glucose, Bld: 112 mg/dL — ABNORMAL HIGH (ref 70–99)
Potassium: 4 mmol/L (ref 3.5–5.1)
Sodium: 135 mmol/L (ref 135–145)

## 2020-10-24 LAB — VANCOMYCIN, PEAK: Vancomycin Pk: 27 ug/mL — ABNORMAL LOW (ref 30–40)

## 2020-10-24 LAB — CBC
HCT: 38.7 % — ABNORMAL LOW (ref 39.0–52.0)
Hemoglobin: 12.6 g/dL — ABNORMAL LOW (ref 13.0–17.0)
MCH: 26.6 pg (ref 26.0–34.0)
MCHC: 32.6 g/dL (ref 30.0–36.0)
MCV: 81.6 fL (ref 80.0–100.0)
Platelets: 366 10*3/uL (ref 150–400)
RBC: 4.74 MIL/uL (ref 4.22–5.81)
RDW: 14.8 % (ref 11.5–15.5)
WBC: 8.7 10*3/uL (ref 4.0–10.5)
nRBC: 0 % (ref 0.0–0.2)

## 2020-10-24 MED ORDER — HYDROMORPHONE HCL 1 MG/ML IJ SOLN
0.5000 mg | INTRAMUSCULAR | Status: DC | PRN
  Administered 2020-10-24 – 2020-10-28 (×27): 0.5 mg via INTRAVENOUS
  Filled 2020-10-24 (×28): qty 0.5

## 2020-10-24 NOTE — Progress Notes (Signed)
[facility]  PROGRESS NOTE  Tyler CoopDerek [name] JXB:[id number] DOB: 12-05-1985 DOA: 10/20/2020 PCP: Patient, No Pcp Per Admit date - 10/20/2020   Admitting Physician Jae DireJared E Segal, MD  Outpatient Primary MD for the patient is Patient, No Pcp Per  LOS - 4 Brief Narrative   Tyler CoopDerek [name] is a 35 y.o. year old male with medical history significant for Recurrent prostate abscess (he has had 2 prior admissions: 10/4-10/8 with unsuccessful transurethral moving followed by transrectal drain placement with wound cultures growing strep angina gnosis, followed by hospitalization 10/19-2010/25 for sepsis due to perineal/right scrotal abscess requiring I&D).  During last hospitalization patient underwent cystourethroscopy and I&D of abscess on 10/19 completed a week of empiric IV vancomycin, cefepime and Flagyl, blood cultures remain unremarkable at that time and abscess culture showed mixed anaerobic flora and was discharged 2 to 3 weeks of oral Augmentin.  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  He is admitted with working diagnosis sepsis secondary to recurrent multiloculated perirectal abscesses, given his history of polymicrobial abscesses he was started on empiric vancomycin and Zosyn and underwent operative I&D of perineal abscess by urology as well as transurethral resection of the prostate abscess on 1/4.  Hospital course complicated by persistent tachycardia slowly downtrending fever and persistent perineal/prostate pain.    Subjective  Continues to report pain in posterior area mainly in the left buttocks that brings him close to tears  A & P   Sepsis secondary to recurrent prostate and perineal abscess, status post TURP abscess and I&D of perineal abscess on 1/4, sepsis resolved. SIRS physiology now  resolved.  But the patient still has exquisite amount of pain in the posterior perineal area towards the left buttocks.  No induration, fluctuance or abscesses noted on my examination.  This could be related to ongoing pain some type of nerve involvement, doubt untreated infection given improvement in SIRS parameters.  Still no growth on blood or intraoperative cultures.  -monitor admission and repeat blood cultures from 1/5 blood cultures - GPCs in pairs consistent with strep/GNR's in the surgical wound culture, awaiting further sensitivities for micro results -continue vancomycin ( appreciate pharmacology assistance with trough), Zosyn - Appreciate urology assistance, managing perineal Penrose drainage for perineal wound, Foley catheter removed -CBC daily -Given exquisite tenderness out of proportion to lab parameters improvement may warrant repeat CT abdomen imaging or repeat urologic intervention, will continue monitor -Optimize pain control discontinue IV morphine for IV dilaudid as needed severe pain,  Tylenol for mild, oxycodone for moderate as needed pain.  Optimize bowel regimen  Recurrent prostate/perineal abscesses, unclear risk factors.  Patient has no known history of immunosuppression.  Last HIV test in October was negative.  HIV antibody and GC chlamydia both negative  Tobacco use - Nicotine patch in place - Cessation emphasized on admission     Family Communication  : None  Code Status : Full  Disposition Plan  :  Patient is from home. Anticipated d/c date: 2 to 3 days. Barriers to d/c or necessity for inpatient status:  Still requiring  IV antibiotics while awaiting intraoperative culture sensitivities, IV pain control for significant perineal pain Consults  : Urology  Procedures  : TURP and I&D of perineal abscess on 1/4  DVT Prophylaxis  : Heparin MDM: The below labs and imaging reports were reviewed  and summarized above.  Medication management as above.  Lab Results   Component Value Date   PLT 366 10/24/2020    Diet :  Diet Order            Diet regular Room service appropriate? Yes; Fluid consistency: Thin  Diet effective now                  Inpatient Medications Scheduled Meds: . Chlorhexidine Gluconate Cloth  6 each Topical Daily  . nicotine  21 mg Transdermal Daily  . senna-docusate  2 tablet Oral BID   Continuous Infusions: . lactated ringers    . piperacillin-tazobactam (ZOSYN)  IV 3.375 g (10/24/20 0628)  . vancomycin 1,500 mg (10/24/20 0922)   PRN Meds:.acetaminophen, HYDROmorphone (DILAUDID) injection, lip balm, ondansetron **OR** ondansetron (ZOFRAN) IV, oxyCODONE, polyethylene glycol  Antibiotics  :   Anti-infectives (From admission, onward)   Start     Dose/Rate Route Frequency Ordered Stop   10/20/20 2200  vancomycin (VANCOREADY) IVPB 1500 mg/300 mL        1,500 mg 150 mL/hr over 120 Minutes Intravenous Every 12 hours 10/20/20 1017     10/20/20 1800  vancomycin (VANCOREADY) IVPB 1500 mg/300 mL  Status:  Discontinued        1,500 mg 150 mL/hr over 120 Minutes Intravenous Every 12 hours 10/20/20 1015 10/20/20 1017   10/20/20 1400  piperacillin-tazobactam (ZOSYN) IVPB 3.375 g        3.375 g 12.5 mL/hr over 240 Minutes Intravenous Every 8 hours 10/20/20 1015     10/20/20 1200  vancomycin (VANCOREADY) IVPB 500 mg/100 mL        500 mg 100 mL/hr over 60 Minutes Intravenous  Once 10/20/20 1017 10/20/20 1620   10/20/20 0615  ceFEPIme (MAXIPIME) 2 g in sodium chloride 0.9 % 100 mL IVPB        2 g 200 mL/hr over 30 Minutes Intravenous STAT 10/20/20 0609 10/20/20 0936   10/20/20 0615  vancomycin (VANCOREADY) IVPB 1500 mg/300 mL        1,500 mg 150 mL/hr over 120 Minutes Intravenous STAT 10/20/20 0610 10/20/20 0815   10/20/20 0600  metroNIDAZOLE (FLAGYL) IVPB 500 mg        500 mg 100 mL/hr over 60 Minutes Intravenous  Once 10/20/20 0551 10/20/20 1035       Objective   Vitals:   10/23/20 1330 10/23/20 2147 10/24/20 0554  10/24/20 1317  BP: 124/74 137/84 127/76 122/76  Pulse: 90 76 85 87  Resp: 19 17 19 14   Temp: 98.4 F (36.9 C) 98.2 F (36.8 C) 98.6 F (37 C) 98.6 F (37 C)  TempSrc: Oral Oral Oral Oral  SpO2: 98% 97% 97% 100%  Weight:      Height:        SpO2: 100 % O2 Flow Rate (L/min): 2 L/min  Wt Readings from Last 3 Encounters:  10/23/20 88.6 kg  08/04/20 81.6 kg  07/21/20 85.9 kg     Intake/Output Summary (Last 24 hours) at 10/24/2020 1349 Last data filed at 10/24/2020 1100 Gross per 24 hour  Intake 1283.89 ml  Output 3425 ml  Net -2141.11 ml    Physical Exam:   Awake Alert, Oriented X 3, Normal affect No new F.N deficits,  [place].AT, Normal respiratory effort on room air, CTAB Regular rate regular rhythm,No Gallops,Rubs or new Murmurs,  +ve B.Sounds, Abd Soft, No tenderness, No rebound, guarding or rigidity. Penrose drain in place Exquisite tenderness in perineum and perirectum  with slight erythema but no induration or fluctuance noted 10/23/2020      I have personally reviewed the following:   Data Reviewed:  CBC Recent Labs  Lab 10/18/20 1245 10/20/20 0420 10/21/20 0029 10/22/20 0707 10/23/20 0606 10/24/20 0620  WBC 19.4* 26.0* 30.4* 22.4* 11.7* 8.7  HGB 14.4 13.6 12.4* 12.4* 13.0 12.6*  HCT 43.9 41.1 37.9* 38.6* 40.0 38.7*  PLT 275 273 269 264 313 366  MCV 81.8 81.1 81.0 82.8 83.3 81.6  MCH 26.8 26.8 26.5 26.6 27.1 26.6  MCHC 32.8 33.1 32.7 32.1 32.5 32.6  RDW 15.0 14.8 14.7 15.0 14.7 14.8  LYMPHSABS 1.8 1.7  --   --   --   --   MONOABS 1.7* 1.9*  --   --   --   --   EOSABS 0.0 0.0  --   --   --   --   BASOSABS 0.0 0.1  --   --   --   --     Chemistries  Recent Labs  Lab 10/18/20 1245 10/20/20 0420 10/21/20 0029 10/22/20 0707 10/23/20 0606 10/24/20 0620  NA 134* 131* 134* 134* 135 135  K 3.9 3.8 4.1 3.8 4.2 4.0  CL 97* 94* 99 98 99 101  CO2 24 22 24 25 26 25   GLUCOSE 94 152* 145* 96 109* 112*  BUN 9 8 8 9 10 11   CREATININE 1.11 0.94 1.03  1.11 1.13 0.96  CALCIUM 9.3 9.2 8.4* 8.7* 8.8* 8.9  AST 19 15  --   --   --   --   ALT 14 13  --   --   --   --   ALKPHOS 59 52  --   --   --   --   BILITOT 0.7 0.6  --   --   --   --    ------------------------------------------------------------------------------------------------------------------ No results for input(s): CHOL, HDL, LDLCALC, TRIG, CHOLHDL, LDLDIRECT in the last 72 hours.  No results found for: HGBA1C ------------------------------------------------------------------------------------------------------------------ No results for input(s): TSH, T4TOTAL, T3FREE, THYROIDAB in the last 72 hours.  Invalid input(s): FREET3 ------------------------------------------------------------------------------------------------------------------ No results for input(s): VITAMINB12, FOLATE, FERRITIN, TIBC, IRON, RETICCTPCT in the last 72 hours.  Coagulation profile Recent Labs  Lab 10/20/20 0620 10/21/20 0029  INR 1.2 1.3*    No results for input(s): DDIMER in the last 72 hours.  Cardiac Enzymes No results for input(s): CKMB, TROPONINI, MYOGLOBIN in the last 168 hours.  Invalid input(s): CK ------------------------------------------------------------------------------------------------------------------ No results found for: BNP  Micro Results Recent Results (from the past 240 hour(s))  Urine culture     Status: None   Collection Time: 10/20/20  6:00 AM   Specimen: Urine, Clean Catch  Result Value Ref Range Status   Specimen Description   Final    URINE, CLEAN CATCH Performed at [facility], [address], [place], [place] [address]    Special Requests   Final    NONE Performed at [facility], [address] [place], [place] [address]    Culture   Final    NO GROWTH Performed at [facility], [address], [place], [place] [address]    Report Status 10/21/2020 FINAL  Final  Resp Panel by RT-PCR (Flu A&B, Covid)  Nasopharyngeal Swab     Status: None   Collection Time: 10/20/20  6:18 AM   Specimen: Nasopharyngeal Swab; Nasopharyngeal(NP) swabs in vial transport medium  Result Value Ref Range Status  SARS Coronavirus 2 by RT PCR NEGATIVE NEGATIVE Final    Comment: (NOTE) SARS-CoV-2 target nucleic acids are NOT DETECTED.  The SARS-CoV-2 RNA is generally detectable in upper respiratory specimens during the acute phase of infection. The lowest concentration of SARS-CoV-2 viral copies this assay can detect is 138 copies/mL. A negative result does not preclude SARS-Cov-2 infection and should not be used as the sole basis for treatment or other patient management decisions. A negative result may occur with  improper specimen collection/handling, submission of specimen other than nasopharyngeal swab, presence of viral mutation(s) within the areas targeted by this assay, and inadequate number of viral copies(<138 copies/mL). A negative result must be combined with clinical observations, patient history, and epidemiological information. The expected result is Negative.  Fact Sheet for Patients:  BloggerCourse.com  Fact Sheet for Healthcare Providers:  SeriousBroker.it  This test is no t yet approved or cleared by the [place] FDA and  has been authorized for detection and/or diagnosis of SARS-CoV-2 by FDA under an Emergency Use Authorization (EUA). This EUA will remain  in effect (meaning this test can be used) for the duration of the COVID-19 declaration under Section 564(b)(1) of the Act, 21 U.S.C.section 360bbb-3(b)(1), unless the authorization is terminated  or revoked sooner.       Influenza A by PCR NEGATIVE NEGATIVE Final   Influenza B by PCR NEGATIVE NEGATIVE Final    Comment: (NOTE) The Xpert Xpress SARS-CoV-2/FLU/RSV plus assay is intended as an aid in the diagnosis of influenza from Nasopharyngeal swab specimens and should not be  used as a sole basis for treatment. Nasal washings and aspirates are unacceptable for Xpert Xpress SARS-CoV-2/FLU/RSV testing.  Fact Sheet for Patients: BloggerCourse.com  Fact Sheet for Healthcare Providers: SeriousBroker.it  This test is not yet approved or cleared by the [place] FDA and has been authorized for detection and/or diagnosis of SARS-CoV-2 by FDA under an Emergency Use Authorization (EUA). This EUA will remain in effect (meaning this test can be used) for the duration of the COVID-19 declaration under Section 564(b)(1) of the Act, 21 U.S.C. section 360bbb-3(b)(1), unless the authorization is terminated or revoked.  Performed at [facility], [address], [place], [place] [address]   Blood culture (routine x 2)     Status: None (Preliminary result)   Collection Time: 10/20/20  6:20 AM   Specimen: BLOOD  Result Value Ref Range Status   Specimen Description   Final    BLOOD LEFT ANTECUBITAL Performed at [facility], [address] [place], [place] [address]    Special Requests   Final    BOTTLES DRAWN AEROBIC ONLY Blood Culture results may not be optimal due to an inadequate volume of blood received in culture bottles Performed at [facility], [address] [place], [place] [address]    Culture   Final    NO GROWTH 4 DAYS Performed at [facility], [address], [place], [place] [address]    Report Status PENDING  Incomplete  Blood culture (routine x 2)     Status: None (Preliminary result)   Collection Time: 10/20/20  8:55 AM   Specimen: BLOOD  Result Value Ref Range Status   Specimen Description   Final    BLOOD RIGHT WRIST Performed at [facility], [address], [place], [place] [address]    Special Requests   Final    BOTTLES DRAWN AEROBIC AND ANAEROBIC Blood Culture results may  not be optimal due to an  excessive volume of blood received in culture bottles Performed at [facility], [address] [place], [place] [address]    Culture   Final    NO GROWTH 4 DAYS Performed at [facility], [address], [place], [place] [address]    Report Status PENDING  Incomplete  Surgical pcr screen     Status: None   Collection Time: 10/20/20  1:24 PM   Specimen: Nasal Mucosa; Nasal Swab  Result Value Ref Range Status   MRSA, PCR NEGATIVE NEGATIVE Final   Staphylococcus aureus NEGATIVE NEGATIVE Final    Comment: (NOTE) The Xpert SA Assay (FDA approved for NASAL specimens in patients 57 years of age and older), is one component of a comprehensive surveillance program. It is not intended to diagnose infection nor to guide or monitor treatment. Performed at [facility], [address] [place], [place] [address]   Aerobic/Anaerobic Culture (surgical/deep wound)     Status: None (Preliminary result)   Collection Time: 10/20/20  5:07 PM   Specimen: PATH Soft tissue  Result Value Ref Range Status   Specimen Description PERINEAL ABSCESS  Final   Special Requests DRAINAGE  Final   Gram Stain   Final    MODERATE WBC PRESENT,BOTH PMN AND MONONUCLEAR FEW GRAM POSITIVE COCCI IN PAIRS IN CHAINS MODERATE GRAM NEGATIVE RODS RARE GRAM POSITIVE RODS    Culture   Final    HOLDING FOR POSSIBLE ANAEROBE Performed at [facility], [address] [place], [place] [address]    Report Status PENDING  Incomplete  Aerobic Culture (superficial specimen)     Status: None   Collection Time: 10/20/20  5:07 PM   Specimen: Wound  Result Value Ref Range Status   Specimen Description WOUND PERINEAL ABSCESS  Final   Special Requests SWAB  Final   Gram Stain NO WBC SEEN NO ORGANISMS SEEN   Final   Culture   Final    FEW NORMAL SKIN FLORA Performed at [facility], [address], [place], [place] [address]    Report Status 10/23/2020 FINAL  Final   Culture, blood (routine x 2)     Status: None (Preliminary result)   Collection Time: 10/21/20  1:35 PM   Specimen: BLOOD RIGHT HAND  Result Value Ref Range Status   Specimen Description   Final    BLOOD RIGHT HAND Performed at [facility], [address] [place], [place] [address]    Special Requests   Final    BOTTLES DRAWN AEROBIC ONLY Blood Culture adequate volume Performed at [facility], [address] [place], [place] [address]    Culture   Final    NO GROWTH 3 DAYS Performed at [facility], [address], [place], [place] [address]    Report Status PENDING  Incomplete  Culture, blood (routine x 2)     Status: None (Preliminary result)   Collection Time: 10/21/20  1:35 PM   Specimen: BLOOD  Result Value Ref Range Status   Specimen Description   Final    BLOOD RIGHT ANTECUBITAL Performed at [facility], [address], [place], [place] [address]    Special Requests   Final    BOTTLES DRAWN AEROBIC AND ANAEROBIC Blood Culture adequate volume Performed at [facility], [address], [place], [place] [address]    Culture   Final  NO GROWTH 3 DAYS Performed at [facility], [address] [place], [place] [address]    Report Status PENDING  Incomplete    Radiology Reports CT ABDOMEN PELVIS W CONTRAST  Result Date: 10/20/2020 CLINICAL DATA:  Rectal pain, rectal abscess, leukocytosis EXAM: CT ABDOMEN AND PELVIS WITH CONTRAST TECHNIQUE: Multidetector CT imaging of the abdomen and pelvis was performed using the standard protocol following bolus administration of intravenous contrast. CONTRAST:  OMNIPAQUE IOHEXOL 300 MG/ML  SOLN COMPARISON:  07/20/2020, 07/26/2020 FINDINGS: Lower chest: The visualized lung bases are clear bilaterally. The visualized heart and pericardium are unremarkable. Hepatobiliary: No focal liver abnormality is seen. No gallstones, gallbladder wall  thickening, or biliary dilatation. Pancreas: Unremarkable Spleen: Unremarkable Adrenals/Urinary Tract: The adrenal glands are unremarkable. The kidneys are normal. The bladder is largely decompressed. Previously noted bladder wall thickening has improved in the interval. There is a defect seen at the bladder base involving the prostatic urethra in keeping with the given history of trans urethral resection. Stomach/Bowel: The stomach, small bowel, and large bowel are unremarkable. No free intraperitoneal gas or fluid. Vascular/Lymphatic: There is shotty bilateral external iliac lymphadenopathy, likely reactive in nature. No frankly pathologic adenopathy within the abdomen and pelvis. The vascular structures are unremarkable. Reproductive: As noted above, a defect is seen within the prostate gland in keeping with the given history of trans urethral resection. Other: Just inferior to the prostate gland, surrounding the expected membranous urethra is a recurrent perirectal abscess measuring 3.8 x 4.4 cm at axial image # 80/2. This component above the levator ani appears separate from 2 additional components involving the crus of the corpus cavernosa bilaterally. On the right, this measures 2.0 x 6.6 cm on axial image # 87/2. On the left, this multiloculated collection is less well-defined measuring roughly 1.5 x 4.1 cm at axial image # 86/2. Musculoskeletal: No acute bone abnormality. IMPRESSION: Recurrent multiloculated perirectal abscesses demonstrating components both above the levator ani as well as below the levator involving the crus of the corpus cavernosa bilaterally. Interval changes of trans urethral resection of the prostate with improved bladder wall thickening. Electronically Signed   By: Helyn Numbers MD   On: 10/20/2020 06:46     Time Spent in minutes  30     Laverna Peace M.D on 10/24/2020 at 1:49 PM  To page go to [email or web address] - password [email or web address]

## 2020-10-24 NOTE — Progress Notes (Signed)
Pharmacy Antibiotic Note  Tyler [name] is a 35 y.o. male admitted on 10/20/2020 with perineal abscess. Pharmacy has been consulted for vancomycin and Zosyn dosing.  Admitted 10/4-10/8 for prostate abscess s/p unsuccessful transurethral unroofing followed by transrectal drain placement with cultures growing S anginosus; discharged on Levaquin. Subsequently had recurrence of pain and swelling and was hospitalized from 10/18-10/25 for sepsis secondary to perineal/right scrotal abscess at which time he underwent cystourethroscopy and I&D 10/19 and discharged with Augmentin to complete 3 wks abx.  Plan:  Continue Vancomycin 1500 mg IV q12 hr (est AUC 473 based on SCr 0.94; Vd 0.72)  Measure vancomycin peak and trough today to calculate AUC, goal 400-550  SCr daily while on vanc + Zosyn  Continue Zosyn 3.375 g IV given once over 30 minutes, then every 8 hrs by 4-hr infusion  Follow up cultures and ability to narrow  Height: 6' (182.9 cm) Weight: 88.6 kg (195 lb 5.2 oz) IBW/kg (Calculated) : 77.6  Temp (24hrs), Avg:98.4 F (36.9 C), Min:98.2 F (36.8 C), Max:98.6 F (37 C)  Recent Labs  Lab 10/18/20 1245 10/20/20 0420 10/20/20 0620 10/20/20 0855 10/20/20 2216 10/21/20 0029 10/22/20 0707 10/23/20 0606 10/24/20 0620  WBC 19.4* 26.0*  --   --   --  30.4* 22.4* 11.7* 8.7  CREATININE 1.11 0.94  --   --   --  1.03 1.11 1.13 0.96  LATICACIDVEN 1.3  --  1.7 0.9 2.5* 1.1  --   --   --     Estimated Creatinine Clearance: 121.3 mL/min (by C-G formula based on SCr of 0.96 mg/dL).    No Known Allergies  Antimicrobials this admission: 1/4 Cefepime/Flagyl x 1  1/4 vanc >> 1/4 Zosyn >>   Dose adjustments this admission:  1/8 VPk 1300 = ___, VTr 2130 = ___, calculated AUC + ___  Microbiology results: 1/4 BCx: ngtd 1/4 UCx: NGF 1/4 MRSA surgical PCR: neg 1/4 Perineal abscess swab: normal skin flora 1/4 Abscess drainage: holding for possible anaerobe 1/5 BCx: ngtd 1/5 GC/chlamydia  probe: neg 1/5 HIV Ab: neg Original drain Cx grew S anginosus only R-erythro  Thank you for allowing pharmacy to be a part of this patient's care.  Loralee Pacas, PharmD, BCPS Pharmacy: [phone] 10/24/2020 10:31 AM

## 2020-10-24 NOTE — Progress Notes (Signed)
4 Days Post-Op   Subjective/Chief Complaint:  1-  Recurrent peri-prostatic, perineal, penile abscess - smoldering abscess initially managed 07/2020 with recurrence by imaging and exam 10/2020 involving  base of penis, prostate. Prior CX 07/2020 polymicrobial (e.coli, actinomyces, strep) sens Vanc + Gent. HIV negative, no h/o diabetes or diverticulitis.   Repeat cysto TURP (no intraprostatic abscess), perineal I+D (large peri-prostatic / perineal abscess + drain placement) 10/21/19. Tyler [name], [facility] 10/20/2020 pending. Placed on Vanc + Zosyn. Foley removed 1/6.    Today "Tyler [name]" is improving but continues to complain of left buttock pain radiating to his left thigh. Fever and WBC curve continue to improve. No other complaints  Objective: Vital signs in last 24 hours: Temp:  [98.2 F (36.8 C)-98.6 F (37 C)] 98.6 F (37 C) (01/08 1317) Pulse Rate:  [76-87] 87 (01/08 1317) Resp:  [14-19] 14 (01/08 1317) BP: (122-137)/(76-84) 122/76 (01/08 1317) SpO2:  [97 %-100 %] 100 % (01/08 1317) Last BM Date: 10/19/20  Intake/Output from previous day: 01/07 0701 - 01/08 0700 In: 2246.4 [P.O.:1440; IV Piggyback:806.4] Out: 4050 [Urine:4050] Intake/Output this shift: Total I/O In: 715.6 [P.O.:360; IV Piggyback:355.6] Out: 675 [Urine:675]   General appearance: alert, cooperative and very pleasant, at baseline.  Eyes: negative Nose: Nares normal. Septum midline. Mucosa normal. No drainage or sinus tenderness. Throat: lips, mucosa, and tongue normal; teeth and gums normal Neck: supple, symmetrical, trachea midline Back: symmetric, no curvature. ROM normal. No CVA tenderness. Cardio: regular tachycardia GI: soft, non-tender; bowel sounds normal; no masses,  no organomegaly Male genitalia: less penile base and perineal induration, but some still present. Perineal penrose in expected position to woudn drainage with scant output. Clear urine in bedside urinal.   Pulses: 2+ and symmetric Lymph nodes:  Cervical, supraclavicular, and axillary nodes normal. Neurologic: Grossly normal  Lab Results:  Recent Labs    10/23/20 0606 10/24/20 0620  WBC 11.7* 8.7  HGB 13.0 12.6*  HCT 40.0 38.7*  PLT 313 366   BMET Recent Labs    10/23/20 0606 10/24/20 0620  NA 135 135  K 4.2 4.0  CL 99 101  CO2 26 25  GLUCOSE 109* 112*  BUN 10 11  CREATININE 1.13 0.96  CALCIUM 8.8* 8.9   PT/INR No results for input(s): LABPROT, INR in the last 72 hours. ABG No results for input(s): PHART, HCO3 in the last 72 hours.  Invalid input(s): PCO2, PO2  Studies/Results: No results found.  Anti-infectives: Anti-infectives (From admission, onward)   Start     Dose/Rate Route Frequency Ordered Stop   10/20/20 2200  vancomycin (VANCOREADY) IVPB 1500 mg/300 mL        1,500 mg 150 mL/hr over 120 Minutes Intravenous Every 12 hours 10/20/20 1017     10/20/20 1800  vancomycin (VANCOREADY) IVPB 1500 mg/300 mL  Status:  Discontinued        1,500 mg 150 mL/hr over 120 Minutes Intravenous Every 12 hours 10/20/20 1015 10/20/20 1017   10/20/20 1400  piperacillin-tazobactam (ZOSYN) IVPB 3.375 g        3.375 g 12.5 mL/hr over 240 Minutes Intravenous Every 8 hours 10/20/20 1015     10/20/20 1200  vancomycin (VANCOREADY) IVPB 500 mg/100 mL        500 mg 100 mL/hr over 60 Minutes Intravenous  Once 10/20/20 1017 10/20/20 1620   10/20/20 0615  ceFEPIme (MAXIPIME) 2 g in sodium chloride 0.9 % 100 mL IVPB        2 g 200 mL/hr over 30  Minutes Intravenous STAT 10/20/20 0609 10/20/20 0936   10/20/20 0615  vancomycin (VANCOREADY) IVPB 1500 mg/300 mL        1,500 mg 150 mL/hr over 120 Minutes Intravenous STAT 10/20/20 0610 10/20/20 0815   10/20/20 0600  metroNIDAZOLE (FLAGYL) IVPB 500 mg        500 mg 100 mL/hr over 60 Minutes Intravenous  Once 10/20/20 0551 10/20/20 1035      Assessment/Plan:  1-  Recurrent peri-prostatic, perineal, penile abscess - now s/p aggressive surgical debridement.  As his discomfort  remain out of proportion to exam / objetive findings, will consider repeat CT pelvis and/or repeat OR debridement likely early next week before DC home. Continue current antibiotic therapy. Wilkie Aye 10/24/2020

## 2020-10-25 LAB — CBC
HCT: 45 % (ref 39.0–52.0)
Hemoglobin: 14.9 g/dL (ref 13.0–17.0)
MCH: 26.7 pg (ref 26.0–34.0)
MCHC: 33.1 g/dL (ref 30.0–36.0)
MCV: 80.6 fL (ref 80.0–100.0)
Platelets: 372 10*3/uL (ref 150–400)
RBC: 5.58 MIL/uL (ref 4.22–5.81)
RDW: 14.8 % (ref 11.5–15.5)
WBC: 7.2 10*3/uL (ref 4.0–10.5)
nRBC: 0 % (ref 0.0–0.2)

## 2020-10-25 LAB — CULTURE, BLOOD (ROUTINE X 2)
Culture: NO GROWTH
Culture: NO GROWTH

## 2020-10-25 LAB — AEROBIC/ANAEROBIC CULTURE W GRAM STAIN (SURGICAL/DEEP WOUND)

## 2020-10-25 LAB — BASIC METABOLIC PANEL
Anion gap: 12 (ref 5–15)
BUN: 14 mg/dL (ref 6–20)
CO2: 23 mmol/L (ref 22–32)
Calcium: 9.2 mg/dL (ref 8.9–10.3)
Chloride: 100 mmol/L (ref 98–111)
Creatinine, Ser: 0.95 mg/dL (ref 0.61–1.24)
GFR, Estimated: >60 mL/min (ref >60)
Glucose, Bld: 105 mg/dL — ABNORMAL HIGH (ref 70–99)
Potassium: 4.6 mmol/L (ref 3.5–5.1)
Sodium: 135 mmol/L (ref 135–145)

## 2020-10-25 LAB — VANCOMYCIN, TROUGH: Vancomycin Tr: 10 ug/mL — ABNORMAL LOW (ref 15–20)

## 2020-10-25 MED ORDER — VANCOMYCIN HCL 1250 MG/250ML IV SOLN
1250.0000 mg | Freq: Two times a day (BID) | INTRAVENOUS | Status: DC
  Administered 2020-10-25: 1250 mg via INTRAVENOUS
  Filled 2020-10-25 (×2): qty 250

## 2020-10-25 MED ORDER — POLYETHYLENE GLYCOL 3350 17 G PO PACK
17.0000 g | PACK | Freq: Two times a day (BID) | ORAL | Status: DC
  Administered 2020-10-25 – 2020-10-31 (×9): 17 g via ORAL
  Filled 2020-10-25 (×14): qty 1

## 2020-10-25 NOTE — Progress Notes (Signed)
[facility]  PROGRESS NOTE  Tyler [name] EYC:[id number] DOB: [birth year] DOA: 10/20/2020 PCP: Patient, No Pcp Per Admit date - 10/20/2020   Admitting Physician Jae Dire, MD  Outpatient Primary MD for the patient is Patient, No Pcp Per  LOS - 5 Brief Narrative   Tyler [name] is a 35 y.o. year old male with medical history significant for Recurrent prostate abscess (he has had 2 prior admissions: 10/4-10/8 with unsuccessful transurethral moving followed by transrectal drain placement with wound cultures growing strep angina gnosis, followed by hospitalization 10/19-2010/25 for sepsis due to perineal/right scrotal abscess requiring I&D).  During last hospitalization patient underwent cystourethroscopy and I&D of abscess on 10/19 completed a week of empiric IV vancomycin, cefepime and Flagyl, blood cultures remain unremarkable at that time and abscess culture showed mixed anaerobic flora and was discharged 2 to 3 weeks of oral Augmentin.  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  He is admitted with working diagnosis sepsis secondary to recurrent multiloculated perirectal abscesses, given his history of polymicrobial abscesses he was started on empiric vancomycin and Zosyn and underwent operative I&D of perineal abscess by urology as well as transurethral resection of the prostate abscess on 1/4.  Hospital course complicated by persistent tachycardia slowly downtrending fever and persistent perineal/prostate pain.    Subjective  Continues to report pain in posterior area mainly in the left buttocks that brings him close to tears  A & P   Sepsis secondary to recurrent prostate and perineal abscess, status post TURP abscess and I&D of perineal abscess on 1/4, sepsis resolved. SIRS physiology now  resolved.  But the patient still has exquisite amount of pain in the posterior perineal area towards the left buttocks.  No induration, fluctuance or abscesses noted on my examination.  This could be related to ongoing pain some type of nerve involvement, doubt untreated infection given improvement in SIRS parameters.  Still no growth on blood or intraoperative cultures.  -monitor admission and repeat blood cultures from 1/5 blood cultures - GPCs in pairs consistent with strep/GNR's in the surgical wound culture, awaiting further sensitivities for micro results -continue vancomycin ( appreciate pharmacology assistance with trough), Zosyn - Appreciate urology assistance, managing perineal Penrose drainage for perineal wound, Foley catheter removed -CBC daily -Given exquisite tenderness out of proportion to lab parameters improvement may warrant repeat CT abdomen imaging or repeat urologic intervention, will continue monitor -Optimize pain control discontinue IV morphine for IV dilaudid as needed severe pain,  Tylenol for mild, oxycodone for moderate as needed pain.  Optimize bowel regimen  Recurrent prostate/perineal abscesses, unclear risk factors.  Patient has no known history of immunosuppression.  Last HIV test in October was negative.  HIV antibody and GC chlamydia both negative  Tobacco use - Nicotine patch in place - Cessation emphasized on admission     Family Communication  : None  Code Status : Full  Disposition Plan  :  Patient is from home. Anticipated d/c date: 2 to 3 days. Barriers to d/c or necessity for inpatient status:  Still requiring  IV antibiotics while awaiting intraoperative culture sensitivities, IV pain control for significant perineal pain Consults  : Urology  Procedures  : TURP and I&D of perineal abscess on 1/4  DVT Prophylaxis  : Heparin MDM: The below labs and imaging reports were reviewed  and summarized above.  Medication management as above.  Lab Results   Component Value Date   PLT 372 10/25/2020    Diet :  Diet Order            Diet regular Room service appropriate? Yes; Fluid consistency: Thin  Diet effective now                  Inpatient Medications Scheduled Meds: . Chlorhexidine Gluconate Cloth  6 each Topical Daily  . nicotine  21 mg Transdermal Daily  . polyethylene glycol  17 g Oral BID  . senna-docusate  2 tablet Oral BID   Continuous Infusions: . lactated ringers    . piperacillin-tazobactam (ZOSYN)  IV 3.375 g (10/25/20 1404)  . vancomycin     PRN Meds:.acetaminophen, HYDROmorphone (DILAUDID) injection, lip balm, ondansetron **OR** ondansetron (ZOFRAN) IV, oxyCODONE  Antibiotics  :   Anti-infectives (From admission, onward)   Start     Dose/Rate Route Frequency Ordered Stop   10/25/20 2200  vancomycin (VANCOREADY) IVPB 1250 mg/250 mL        1,250 mg 166.7 mL/hr over 90 Minutes Intravenous Every 12 hours 10/25/20 1225     10/20/20 2200  vancomycin (VANCOREADY) IVPB 1500 mg/300 mL  Status:  Discontinued        1,500 mg 150 mL/hr over 120 Minutes Intravenous Every 12 hours 10/20/20 1017 10/25/20 1225   10/20/20 1800  vancomycin (VANCOREADY) IVPB 1500 mg/300 mL  Status:  Discontinued        1,500 mg 150 mL/hr over 120 Minutes Intravenous Every 12 hours 10/20/20 1015 10/20/20 1017   10/20/20 1400  piperacillin-tazobactam (ZOSYN) IVPB 3.375 g        3.375 g 12.5 mL/hr over 240 Minutes Intravenous Every 8 hours 10/20/20 1015     10/20/20 1200  vancomycin (VANCOREADY) IVPB 500 mg/100 mL        500 mg 100 mL/hr over 60 Minutes Intravenous  Once 10/20/20 1017 10/20/20 1620   10/20/20 0615  ceFEPIme (MAXIPIME) 2 g in sodium chloride 0.9 % 100 mL IVPB        2 g 200 mL/hr over 30 Minutes Intravenous STAT 10/20/20 0609 10/20/20 0936   10/20/20 0615  vancomycin (VANCOREADY) IVPB 1500 mg/300 mL        1,500 mg 150 mL/hr over 120 Minutes Intravenous STAT 10/20/20 0610 10/20/20 0815   10/20/20 0600  metroNIDAZOLE  (FLAGYL) IVPB 500 mg        500 mg 100 mL/hr over 60 Minutes Intravenous  Once 10/20/20 0551 10/20/20 1035       Objective   Vitals:   10/24/20 2128 10/25/20 0500 10/25/20 0530 10/25/20 1344  BP: 113/75  132/77 130/84  Pulse: 88  71 91  Resp: 14  14 (!) 24  Temp: 98.8 F (37.1 C)  97.9 F (36.6 C) 99 F (37.2 C)  TempSrc: Oral  Oral Oral  SpO2: 97%  99% 98%  Weight:  86.1 kg    Height:        SpO2: 98 % O2 Flow Rate (L/min): 2 L/min  Wt Readings from Last 3 Encounters:  10/25/20 86.1 kg  08/04/20 81.6 kg  07/21/20 85.9 kg     Intake/Output Summary (Last 24 hours) at 10/25/2020 1820 Last data filed at 10/25/2020 1700 Gross per 24 hour  Intake 2142.78 ml  Output 2700 ml  Net -557.22 ml    Physical Exam:   Awake Alert, Oriented X 3, Normal affect No new  F.N deficits,  [place].AT, Normal respiratory effort on room air, CTAB Regular rate regular rhythm,No Gallops,Rubs or new Murmurs,  +ve B.Sounds, Abd Soft, No tenderness, No rebound, guarding or rigidity. Penrose drain in place Exquisite tenderness in perineum and perirectum with slight erythema but no induration or fluctuance noted 10/23/2020      I have personally reviewed the following:   Data Reviewed:  CBC Recent Labs  Lab 10/20/20 0420 10/21/20 0029 10/22/20 0707 10/23/20 0606 10/24/20 0620 10/25/20 0629  WBC 26.0* 30.4* 22.4* 11.7* 8.7 7.2  HGB 13.6 12.4* 12.4* 13.0 12.6* 14.9  HCT 41.1 37.9* 38.6* 40.0 38.7* 45.0  PLT 273 269 264 313 366 372  MCV 81.1 81.0 82.8 83.3 81.6 80.6  MCH 26.8 26.5 26.6 27.1 26.6 26.7  MCHC 33.1 32.7 32.1 32.5 32.6 33.1  RDW 14.8 14.7 15.0 14.7 14.8 14.8  LYMPHSABS 1.7  --   --   --   --   --   MONOABS 1.9*  --   --   --   --   --   EOSABS 0.0  --   --   --   --   --   BASOSABS 0.1  --   --   --   --   --     Chemistries  Recent Labs  Lab 10/20/20 0420 10/21/20 0029 10/22/20 0707 10/23/20 0606 10/24/20 0620 10/25/20 0629  NA 131* 134* 134* 135 135 135   K 3.8 4.1 3.8 4.2 4.0 4.6  CL 94* 99 98 99 101 100  CO2 22 24 25 26 25 23   GLUCOSE 152* 145* 96 109* 112* 105*  BUN 8 8 9 10 11 14   CREATININE 0.94 1.03 1.11 1.13 0.96 0.95  CALCIUM 9.2 8.4* 8.7* 8.8* 8.9 9.2  AST 15  --   --   --   --   --   ALT 13  --   --   --   --   --   ALKPHOS 52  --   --   --   --   --   BILITOT 0.6  --   --   --   --   --    ------------------------------------------------------------------------------------------------------------------ No results for input(s): CHOL, HDL, LDLCALC, TRIG, CHOLHDL, LDLDIRECT in the last 72 hours.  No results found for: HGBA1C ------------------------------------------------------------------------------------------------------------------ No results for input(s): TSH, T4TOTAL, T3FREE, THYROIDAB in the last 72 hours.  Invalid input(s): FREET3 ------------------------------------------------------------------------------------------------------------------ No results for input(s): VITAMINB12, FOLATE, FERRITIN, TIBC, IRON, RETICCTPCT in the last 72 hours.  Coagulation profile Recent Labs  Lab 10/20/20 0620 10/21/20 0029  INR 1.2 1.3*    No results for input(s): DDIMER in the last 72 hours.  Cardiac Enzymes No results for input(s): CKMB, TROPONINI, MYOGLOBIN in the last 168 hours.  Invalid input(s): CK ------------------------------------------------------------------------------------------------------------------ No results found for: BNP  Micro Results Recent Results (from the past 240 hour(s))  Urine culture     Status: None   Collection Time: 10/20/20  6:00 AM   Specimen: Urine, Clean Catch  Result Value Ref Range Status   Specimen Description   Final    URINE, CLEAN CATCH Performed at [facility], [address], [place], [place] [address]    Special Requests   Final    NONE Performed at [facility], [address], [place], [place] [address]    Culture   Final     NO GROWTH Performed at [facility], [address], [place], [place] [address]    Report Status 10/21/2020 FINAL  Final  Resp Panel by RT-PCR (Flu A&B, Covid) Nasopharyngeal Swab     Status: None   Collection Time: 10/20/20  6:18 AM   Specimen: Nasopharyngeal Swab; Nasopharyngeal(NP) swabs in vial transport medium  Result Value Ref Range Status   SARS Coronavirus 2 by RT PCR NEGATIVE NEGATIVE Final    Comment: (NOTE) SARS-CoV-2 target nucleic acids are NOT DETECTED.  The SARS-CoV-2 RNA is generally detectable in upper respiratory specimens during the acute phase of infection. The lowest concentration of SARS-CoV-2 viral copies this assay can detect is 138 copies/mL. A negative result does not preclude SARS-Cov-2 infection and should not be used as the sole basis for treatment or other patient management decisions. A negative result may occur with  improper specimen collection/handling, submission of specimen other than nasopharyngeal swab, presence of viral mutation(s) within the areas targeted by this assay, and inadequate number of viral copies(<138 copies/mL). A negative result must be combined with clinical observations, patient history, and epidemiological information. The expected result is Negative.  Fact Sheet for Patients:  BloggerCourse.com  Fact Sheet for Healthcare Providers:  SeriousBroker.it  This test is no t yet approved or cleared by the [place] FDA and  has been authorized for detection and/or diagnosis of SARS-CoV-2 by FDA under an Emergency Use Authorization (EUA). This EUA will remain  in effect (meaning this test can be used) for the duration of the COVID-19 declaration under Section 564(b)(1) of the Act, 21 U.S.C.section 360bbb-3(b)(1), unless the authorization is terminated  or revoked sooner.       Influenza A by PCR NEGATIVE NEGATIVE Final   Influenza B by PCR NEGATIVE NEGATIVE Final     Comment: (NOTE) The Xpert Xpress SARS-CoV-2/FLU/RSV plus assay is intended as an aid in the diagnosis of influenza from Nasopharyngeal swab specimens and should not be used as a sole basis for treatment. Nasal washings and aspirates are unacceptable for Xpert Xpress SARS-CoV-2/FLU/RSV testing.  Fact Sheet for Patients: BloggerCourse.com  Fact Sheet for Healthcare Providers: SeriousBroker.it  This test is not yet approved or cleared by the [place] FDA and has been authorized for detection and/or diagnosis of SARS-CoV-2 by FDA under an Emergency Use Authorization (EUA). This EUA will remain in effect (meaning this test can be used) for the duration of the COVID-19 declaration under Section 564(b)(1) of the Act, 21 U.S.C. section 360bbb-3(b)(1), unless the authorization is terminated or revoked.  Performed at [facility], [address], [place], [place] [address]   Blood culture (routine x 2)     Status: None   Collection Time: 10/20/20  6:20 AM   Specimen: BLOOD  Result Value Ref Range Status   Specimen Description   Final    BLOOD LEFT ANTECUBITAL Performed at [facility], [address] [place], [place] [address]    Special Requests   Final    BOTTLES DRAWN AEROBIC ONLY Blood Culture results may not be optimal due to an inadequate volume of blood received in culture bottles Performed at [facility], [address], [place], [place] [address]    Culture   Final    NO GROWTH 5 DAYS Performed at [facility], [address] [place], [place] [address]    Report Status 10/25/2020 FINAL  Final  Blood culture (routine x 2)     Status: None   Collection Time: 10/20/20  8:55 AM  Specimen: BLOOD  Result Value Ref Range Status   Specimen Description   Final    BLOOD RIGHT WRIST Performed at [facility], [address] [place],  [place] [address]    Special Requests   Final    BOTTLES DRAWN AEROBIC AND ANAEROBIC Blood Culture results may not be optimal due to an excessive volume of blood received in culture bottles Performed at [facility], [address] [place], [place] [address]    Culture   Final    NO GROWTH 5 DAYS Performed at [facility], [address], [place], [place] [address]    Report Status 10/25/2020 FINAL  Final  Surgical pcr screen     Status: None   Collection Time: 10/20/20  1:24 PM   Specimen: Nasal Mucosa; Nasal Swab  Result Value Ref Range Status   MRSA, PCR NEGATIVE NEGATIVE Final   Staphylococcus aureus NEGATIVE NEGATIVE Final    Comment: (NOTE) The Xpert SA Assay (FDA approved for NASAL specimens in patients 23 years of age and older), is one component of a comprehensive surveillance program. It is not intended to diagnose infection nor to guide or monitor treatment. Performed at [facility], [address] [place], [place] [address]   Aerobic/Anaerobic Culture (surgical/deep wound)     Status: None   Collection Time: 10/20/20  5:07 PM   Specimen: PATH Soft tissue  Result Value Ref Range Status   Specimen Description PERINEAL ABSCESS  Final   Special Requests DRAINAGE  Final   Gram Stain   Final    MODERATE WBC PRESENT,BOTH PMN AND MONONUCLEAR FEW GRAM POSITIVE COCCI IN PAIRS IN CHAINS MODERATE GRAM NEGATIVE RODS RARE GRAM POSITIVE RODS Performed at [facility], [address] [place], [place] [address]    Culture   Final    MODERATE BACTEROIDES ORALIS BETA LACTAMASE POSITIVE MODERATE FUSOBACTERIUM SPECIES    Report Status 10/25/2020 FINAL  Final  Aerobic Culture (superficial specimen)     Status: None   Collection Time: 10/20/20  5:07 PM   Specimen: Wound  Result Value Ref Range Status   Specimen Description WOUND PERINEAL ABSCESS  Final   Special Requests SWAB  Final   Gram Stain NO WBC SEEN NO ORGANISMS SEEN    Final   Culture   Final    FEW NORMAL SKIN FLORA Performed at [facility], [address], [place], [place] [address]    Report Status 10/23/2020 FINAL  Final  Culture, blood (routine x 2)     Status: None (Preliminary result)   Collection Time: 10/21/20  1:35 PM   Specimen: BLOOD RIGHT HAND  Result Value Ref Range Status   Specimen Description   Final    BLOOD RIGHT HAND Performed at [facility], [address], [place], [place] [address]    Special Requests   Final    BOTTLES DRAWN AEROBIC ONLY Blood Culture adequate volume Performed at [facility], [address] [place], [place] [address]    Culture   Final    NO GROWTH 4 DAYS Performed at [facility], [address], [place], [place] [address]    Report Status PENDING  Incomplete  Culture, blood (routine x 2)     Status: None (Preliminary result)   Collection Time: 10/21/20  1:35 PM   Specimen: BLOOD  Result Value Ref Range Status   Specimen Description   Final  BLOOD RIGHT ANTECUBITAL Performed at [facility], [address] [place], [place] [address]    Special Requests   Final    BOTTLES DRAWN AEROBIC AND ANAEROBIC Blood Culture adequate volume Performed at [facility], [address] [place], [place] [address]    Culture   Final    NO GROWTH 4 DAYS Performed at [facility], [address] [place], [place] [address]    Report Status PENDING  Incomplete    Radiology Reports CT ABDOMEN PELVIS W CONTRAST  Result Date: 10/20/2020 CLINICAL DATA:  Rectal pain, rectal abscess, leukocytosis EXAM: CT ABDOMEN AND PELVIS WITH CONTRAST TECHNIQUE: Multidetector CT imaging of the abdomen and pelvis was performed using the standard protocol following bolus administration of intravenous contrast. CONTRAST:  100mL OMNIPAQUE IOHEXOL 300 MG/ML  SOLN COMPARISON:  07/20/2020, 07/26/2020 FINDINGS: Lower chest: The visualized lung  bases are clear bilaterally. The visualized heart and pericardium are unremarkable. Hepatobiliary: No focal liver abnormality is seen. No gallstones, gallbladder wall thickening, or biliary dilatation. Pancreas: Unremarkable Spleen: Unremarkable Adrenals/Urinary Tract: The adrenal glands are unremarkable. The kidneys are normal. The bladder is largely decompressed. Previously noted bladder wall thickening has improved in the interval. There is a defect seen at the bladder base involving the prostatic urethra in keeping with the given history of trans urethral resection. Stomach/Bowel: The stomach, small bowel, and large bowel are unremarkable. No free intraperitoneal gas or fluid. Vascular/Lymphatic: There is shotty bilateral external iliac lymphadenopathy, likely reactive in nature. No frankly pathologic adenopathy within the abdomen and pelvis. The vascular structures are unremarkable. Reproductive: As noted above, a defect is seen within the prostate gland in keeping with the given history of trans urethral resection. Other: Just inferior to the prostate gland, surrounding the expected membranous urethra is a recurrent perirectal abscess measuring 3.8 x 4.4 cm at axial image # 80/2. This component above the levator ani appears separate from 2 additional components involving the crus of the corpus cavernosa bilaterally. On the right, this measures 2.0 x 6.6 cm on axial image # 87/2. On the left, this multiloculated collection is less well-defined measuring roughly 1.5 x 4.1 cm at axial image # 86/2. Musculoskeletal: No acute bone abnormality. IMPRESSION: Recurrent multiloculated perirectal abscesses demonstrating components both above the levator ani as well as below the levator involving the crus of the corpus cavernosa bilaterally. Interval changes of trans urethral resection of the prostate with improved bladder wall thickening. Electronically Signed   By: Helyn NumbersAshesh  Parikh MD   On: 10/20/2020 06:46     Time  Spent in minutes  30     Laverna PeaceShayla D Kynzlie Hilleary M.D on 10/25/2020 at 6:20 PM  To page go to [email or web address] - password [email or web address]

## 2020-10-25 NOTE — Plan of Care (Signed)

## 2020-10-25 NOTE — Progress Notes (Signed)
Pharmacy Antibiotic Note  Tyler [name] is a 35 y.o. male admitted on 10/20/2020 with perineal abscess. Pharmacy has been consulted for vancomycin and Zosyn dosing.  Vancomycin peak = 27, trough = 10 mcg/ml Calculated AUC = 594 (goal 400-550) SCr 0.95, stable  Plan:  Adjust Vancomycin 1250 mg IV q12 hr for est AUC 495, Cmin 17.4  SCr daily while on vanc + Zosyn  Continue Zosyn 3.375 g IV given once over 30 minutes, then every 8 hrs by 4-hr infusion  Follow up cultures and ability to narrow  Height: 6' (182.9 cm) Weight: 86.1 kg (189 lb 13.1 oz) IBW/kg (Calculated) : 77.6  Temp (24hrs), Avg:98.4 F (36.9 C), Min:97.9 F (36.6 C), Max:98.8 F (37.1 C)  Recent Labs  Lab 10/18/20 1245 10/20/20 0420 10/20/20 0620 10/20/20 0855 10/20/20 2216 10/21/20 0029 10/22/20 0707 10/23/20 0606 10/24/20 0620 10/24/20 1309 10/25/20 0629 10/25/20 0915  WBC 19.4*   < >  --   --   --  30.4* 22.4* 11.7* 8.7  --  7.2  --   CREATININE 1.11   < >  --   --   --  1.03 1.11 1.13 0.96  --  0.95  --   LATICACIDVEN 1.3  --  1.7 0.9 2.5* 1.1  --   --   --   --   --   --   VANCOTROUGH  --   --   --   --   --   --   --   --   --   --   --  10*  VANCOPEAK  --   --   --   --   --   --   --   --   --  27*  --   --    < > = values in this interval not displayed.    Estimated Creatinine Clearance: 122.5 mL/min (by C-G formula based on SCr of 0.95 mg/dL).    No Known Allergies  Antimicrobials this admission: 1/4 Cefepime/Flagyl x 1  1/4 vanc >> 1/4 Zosyn >>   Dose adjustments this admission:  1/8 VPk 1300 = 27, VTr 0915 = 10, calculated AUC = 594, decrease 1250mg  q12h  Microbiology results: 1/4 BCx: ngtd 1/4 UCx: NGF 1/4 MRSA surgical PCR: neg 1/4 Perineal abscess swab: normal skin flora 1/4 Abscess drainage: holding for possible anaerobe 1/5 BCx: ngtd 1/5 GC/chlamydia probe: neg 1/5 HIV Ab: neg Original drain Cx grew S anginosus only R-erythro  Thank you for allowing pharmacy to be a part  of this patient's care.  , PharmD, BCPS Pharmacy: [phone] 10/25/2020 12:22 PM

## 2020-10-26 LAB — CBC
HCT: 41 % (ref 39.0–52.0)
Hemoglobin: 13.3 g/dL (ref 13.0–17.0)
MCH: 26.5 pg (ref 26.0–34.0)
MCHC: 32.4 g/dL (ref 30.0–36.0)
MCV: 81.7 fL (ref 80.0–100.0)
Platelets: 529 10*3/uL — ABNORMAL HIGH (ref 150–400)
RBC: 5.02 MIL/uL (ref 4.22–5.81)
RDW: 15 % (ref 11.5–15.5)
WBC: 7.4 10*3/uL (ref 4.0–10.5)
nRBC: 0 % (ref 0.0–0.2)

## 2020-10-26 LAB — CULTURE, BLOOD (ROUTINE X 2)
Culture: NO GROWTH
Culture: NO GROWTH
Special Requests: ADEQUATE
Special Requests: ADEQUATE

## 2020-10-26 LAB — CREATININE, SERUM
Creatinine, Ser: 0.95 mg/dL (ref 0.61–1.24)
GFR, Estimated: >60 mL/min (ref >60)

## 2020-10-26 MED ORDER — SODIUM CHLORIDE 0.9 % IV SOLN: INTRAVENOUS | Status: DC | PRN

## 2020-10-26 MED ORDER — SODIUM CHLORIDE 0.9 % IV SOLN
3.0000 g | Freq: Four times a day (QID) | INTRAVENOUS | Status: DC
  Administered 2020-10-26 – 2020-10-31 (×20): 3 g via INTRAVENOUS
  Filled 2020-10-26: qty 8
  Filled 2020-10-26 (×2): qty 0.15
  Filled 2020-10-26 (×2): qty 3
  Filled 2020-10-26: qty 0.15
  Filled 2020-10-26: qty 3
  Filled 2020-10-26: qty 0.15
  Filled 2020-10-26: qty 3
  Filled 2020-10-26: qty 0.15
  Filled 2020-10-26: qty 3
  Filled 2020-10-26: qty 0.15
  Filled 2020-10-26: qty 3
  Filled 2020-10-26: qty 8
  Filled 2020-10-26 (×2): qty 0.15
  Filled 2020-10-26 (×2): qty 3
  Filled 2020-10-26 (×4): qty 0.15

## 2020-10-26 NOTE — Progress Notes (Signed)
[facility]  PROGRESS NOTE  Tyler CoopDerek [name] UEA:[id number] DOB: [birth year] DOA: 10/20/2020 PCP: Patient, No Pcp Per Admit date - 10/20/2020   Admitting Physician Jae DireJared E Segal, MD  Outpatient Primary MD for the patient is Patient, No Pcp Per  LOS - 6 Brief Narrative   Tyler CoopDerek Lyman is a 35 y.o. year old male with medical history significant for Recurrent prostate abscess (he has had 2 prior admissions: 10/4-10/8 with unsuccessful transurethral moving followed by transrectal drain placement with wound cultures growing strep angina gnosis, followed by hospitalization 10/19-2010/25 for sepsis due to perineal/right scrotal abscess requiring I&D).  During last hospitalization patient underwent cystourethroscopy and I&D of abscess on 10/19 completed a week of empiric IV vancomycin, cefepime and Flagyl, blood cultures remain unremarkable at that time and abscess culture showed mixed anaerobic flora and was discharged 2 to 3 weeks of oral Augmentin.  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  He is admitted with working diagnosis sepsis secondary to recurrent multiloculated perirectal abscesses, given his history of polymicrobial abscesses he was started on empiric vancomycin and Zosyn and underwent operative I&D of perineal abscess by urology as well as transurethral resection of the prostate abscess on 1/4.  Hospital course complicated by persistent tachycardia slowly downtrending fever and persistent perineal/prostate pain.    Subjective  Feels pain controlled on regimen but it is still very limiting in movement  A & P   Sepsis secondary to recurrent prostate and perineal abscess, status post TURP abscess and I&D of perineal abscess on 1/4, sepsis resolved. SIRS physiology now resolved.  But the patient  still has exquisite amount of pain in the posterior perineal area towards the left buttocks.   Intraoperative cultures positive for fusobacterium and bacteroides --dc empirica abx for unasyn and monitor --given persistent pain requiring IV meds will obtain repeat CT imaging.   - Appreciate urology assistance, managing perineal Penrose drainage for perineal wound, Foley catheter removed -CBC daily -Optimize pain control discontinue IV morphine for IV dilaudid as needed severe pain,  Tylenol for mild, oxycodone for moderate as needed pain.  Optimize bowel regimen  Recurrent prostate/perineal abscesses, unclear risk factors.  Patient has no known history of immunosuppression.  Last HIV test in October was negative.  HIV antibody and GC chlamydia both negative  Tobacco use - Nicotine patch in place - Cessation emphasized on admission     Family Communication  : None  Code Status : Full  Disposition Plan  :  Patient is from home. Anticipated d/c date: 2 to 3 days. Barriers to d/c or necessity for inpatient status:  IV unaysn, repeat CT abd, IV pain control for significant perineal pain Consults  : Urology  Procedures  : TURP and I&D of perineal abscess on 1/4  DVT Prophylaxis  : Heparin MDM: The below labs and imaging reports were reviewed and summarized above.  Medication management as above.  Lab Results  Component Value Date   PLT 529 (H) 10/26/2020    Diet :  Diet Order            Diet regular Room service appropriate? Yes; Fluid consistency: Thin  Diet effective now                  Inpatient Medications Scheduled Meds: . Chlorhexidine Gluconate Cloth  6 each Topical  Daily  . nicotine  21 mg Transdermal Daily  . polyethylene glycol  17 g Oral BID  . senna-docusate  2 tablet Oral BID   Continuous Infusions: . sodium chloride 10 mL/hr at 10/26/20 1400  . ampicillin-sulbactam (UNASYN) IV 3 g (10/26/20 2124)  . lactated ringers     PRN Meds:.sodium chloride,  acetaminophen, HYDROmorphone (DILAUDID) injection, lip balm, ondansetron **OR** ondansetron (ZOFRAN) IV, oxyCODONE  Antibiotics  :   Anti-infectives (From admission, onward)   Start     Dose/Rate Route Frequency Ordered Stop   10/26/20 1400  Ampicillin-Sulbactam (UNASYN) 3 g in sodium chloride 0.9 % 100 mL IVPB        3 g 200 mL/hr over 30 Minutes Intravenous Every 6 hours 10/26/20 0840     10/25/20 2200  vancomycin (VANCOREADY) IVPB 1250 mg/250 mL  Status:  Discontinued        1,250 mg 166.7 mL/hr over 90 Minutes Intravenous Every 12 hours 10/25/20 1225 10/26/20 0840   10/20/20 2200  vancomycin (VANCOREADY) IVPB 1500 mg/300 mL  Status:  Discontinued        1,500 mg 150 mL/hr over 120 Minutes Intravenous Every 12 hours 10/20/20 1017 10/25/20 1225   10/20/20 1800  vancomycin (VANCOREADY) IVPB 1500 mg/300 mL  Status:  Discontinued        1,500 mg 150 mL/hr over 120 Minutes Intravenous Every 12 hours 10/20/20 1015 10/20/20 1017   10/20/20 1400  piperacillin-tazobactam (ZOSYN) IVPB 3.375 g  Status:  Discontinued        3.375 g 12.5 mL/hr over 240 Minutes Intravenous Every 8 hours 10/20/20 1015 10/26/20 0840   10/20/20 1200  vancomycin (VANCOREADY) IVPB 500 mg/100 mL        500 mg 100 mL/hr over 60 Minutes Intravenous  Once 10/20/20 1017 10/20/20 1620   10/20/20 0615  ceFEPIme (MAXIPIME) 2 g in sodium chloride 0.9 % 100 mL IVPB        2 g 200 mL/hr over 30 Minutes Intravenous STAT 10/20/20 0609 10/20/20 0936   10/20/20 0615  vancomycin (VANCOREADY) IVPB 1500 mg/300 mL        1,500 mg 150 mL/hr over 120 Minutes Intravenous STAT 10/20/20 0610 10/20/20 0815   10/20/20 0600  metroNIDAZOLE (FLAGYL) IVPB 500 mg        500 mg 100 mL/hr over 60 Minutes Intravenous  Once 10/20/20 0551 10/20/20 1035       Objective   Vitals:   10/26/20 0500 10/26/20 0625 10/26/20 1300 10/26/20 2105  BP:  124/81 126/85 130/78  Pulse:  80 95 (!) 107  Resp:  18 14 16   Temp:  98.2 F (36.8 C) (!) 97.4 F  (36.3 C) 98.9 F (37.2 C)  TempSrc:    Oral  SpO2:  96% 100% 98%  Weight: 84.4 kg     Height:        SpO2: 98 % O2 Flow Rate (L/min): 2 L/min  Wt Readings from Last 3 Encounters:  10/26/20 84.4 kg  08/04/20 81.6 kg  07/21/20 85.9 kg     Intake/Output Summary (Last 24 hours) at 10/26/2020 2136 Last data filed at 10/26/2020 1802 Gross per 24 hour  Intake 1318.36 ml  Output 1775 ml  Net -456.64 ml    Physical Exam:   Awake Alert, Oriented X 3, Normal affect, in pain whenever he moves No new F.N deficits,  [place].AT, Normal respiratory effort on room air, CTAB Regular rate regular rhythm,No Gallops,Rubs or new Murmurs,  +ve B.Sounds, Abd  Soft, No tenderness, No rebound, guarding or rigidity. Penrose drain in place Exquisite tenderness in perineum and perirectum with slight erythema but no induration or fluctuance noted 10/23/2020      I have personally reviewed the following:   Data Reviewed:  CBC Recent Labs  Lab 10/20/20 0420 10/21/20 0029 10/22/20 0707 10/23/20 0606 10/24/20 0620 10/25/20 0629 10/26/20 0958  WBC 26.0*   < > 22.4* 11.7* 8.7 7.2 7.4  HGB 13.6   < > 12.4* 13.0 12.6* 14.9 13.3  HCT 41.1   < > 38.6* 40.0 38.7* 45.0 41.0  PLT 273   < > 264 313 366 372 529*  MCV 81.1   < > 82.8 83.3 81.6 80.6 81.7  MCH 26.8   < > 26.6 27.1 26.6 26.7 26.5  MCHC 33.1   < > 32.1 32.5 32.6 33.1 32.4  RDW 14.8   < > 15.0 14.7 14.8 14.8 15.0  LYMPHSABS 1.7  --   --   --   --   --   --   MONOABS 1.9*  --   --   --   --   --   --   EOSABS 0.0  --   --   --   --   --   --   BASOSABS 0.1  --   --   --   --   --   --    < > = values in this interval not displayed.    Chemistries  Recent Labs  Lab 10/20/20 0420 10/21/20 0029 10/22/20 0707 10/23/20 0606 10/24/20 0620 10/25/20 0629 10/26/20 0539  NA 131* 134* 134* 135 135 135  --   K 3.8 4.1 3.8 4.2 4.0 4.6  --   CL 94* 99 98 99 101 100  --   CO2 22 24 25 26 25 23   --   GLUCOSE 152* 145* 96 109* 112* 105*  --    BUN 8 8 9 10 11 14   --   CREATININE 0.94 1.03 1.11 1.13 0.96 0.95 0.95  CALCIUM 9.2 8.4* 8.7* 8.8* 8.9 9.2  --   AST 15  --   --   --   --   --   --   ALT 13  --   --   --   --   --   --   ALKPHOS 52  --   --   --   --   --   --   BILITOT 0.6  --   --   --   --   --   --    ------------------------------------------------------------------------------------------------------------------ No results for input(s): CHOL, HDL, LDLCALC, TRIG, CHOLHDL, LDLDIRECT in the last 72 hours.  No results found for: HGBA1C ------------------------------------------------------------------------------------------------------------------ No results for input(s): TSH, T4TOTAL, T3FREE, THYROIDAB in the last 72 hours.  Invalid input(s): FREET3 ------------------------------------------------------------------------------------------------------------------ No results for input(s): VITAMINB12, FOLATE, FERRITIN, TIBC, IRON, RETICCTPCT in the last 72 hours.  Coagulation profile Recent Labs  Lab 10/20/20 0620 10/21/20 0029  INR 1.2 1.3*    No results for input(s): DDIMER in the last 72 hours.  Cardiac Enzymes No results for input(s): CKMB, TROPONINI, MYOGLOBIN in the last 168 hours.  Invalid input(s): CK ------------------------------------------------------------------------------------------------------------------ No results found for: BNP  Micro Results Recent Results (from the past 240 hour(s))  Urine culture     Status: None   Collection Time: 10/20/20  6:00 AM   Specimen: Urine, Clean Catch  Result Value Ref Range Status  Specimen Description   Final    URINE, CLEAN CATCH Performed at [facility], [address] [place], [place] [address]    Special Requests   Final    NONE Performed at [facility], [address] [place], [place] [address]    Culture   Final    NO GROWTH Performed at [facility], [address] [place],  [place] [address]    Report Status 10/21/2020 FINAL  Final  Resp Panel by RT-PCR (Flu A&B, Covid) Nasopharyngeal Swab     Status: None   Collection Time: 10/20/20  6:18 AM   Specimen: Nasopharyngeal Swab; Nasopharyngeal(NP) swabs in vial transport medium  Result Value Ref Range Status   SARS Coronavirus 2 by RT PCR NEGATIVE NEGATIVE Final    Comment: (NOTE) SARS-CoV-2 target nucleic acids are NOT DETECTED.  The SARS-CoV-2 RNA is generally detectable in upper respiratory specimens during the acute phase of infection. The lowest concentration of SARS-CoV-2 viral copies this assay can detect is 138 copies/mL. A negative result does not preclude SARS-Cov-2 infection and should not be used as the sole basis for treatment or other patient management decisions. A negative result may occur with  improper specimen collection/handling, submission of specimen other than nasopharyngeal swab, presence of viral mutation(s) within the areas targeted by this assay, and inadequate number of viral copies(<138 copies/mL). A negative result must be combined with clinical observations, patient history, and epidemiological information. The expected result is Negative.  Fact Sheet for Patients:  BloggerCourse.comhttps://www.fda.gov/media/152166/download  Fact Sheet for Healthcare Providers:  SeriousBroker.ithttps://www.fda.gov/media/152162/download  This test is no t yet approved or cleared by the [place] FDA and  has been authorized for detection and/or diagnosis of SARS-CoV-2 by FDA under an Emergency Use Authorization (EUA). This EUA will remain  in effect (meaning this test can be used) for the duration of the COVID-19 declaration under Section 564(b)(1) of the Act, 21 U.S.C.section 360bbb-3(b)(1), unless the authorization is terminated  or revoked sooner.       Influenza A by PCR NEGATIVE NEGATIVE Final   Influenza B by PCR NEGATIVE NEGATIVE Final    Comment: (NOTE) The Xpert Xpress SARS-CoV-2/FLU/RSV plus assay is intended as  an aid in the diagnosis of influenza from Nasopharyngeal swab specimens and should not be used as a sole basis for treatment. Nasal washings and aspirates are unacceptable for Xpert Xpress SARS-CoV-2/FLU/RSV testing.  Fact Sheet for Patients: BloggerCourse.comhttps://www.fda.gov/media/152166/download  Fact Sheet for Healthcare Providers: SeriousBroker.ithttps://www.fda.gov/media/152162/download  This test is not yet approved or cleared by the [place] FDA and has been authorized for detection and/or diagnosis of SARS-CoV-2 by FDA under an Emergency Use Authorization (EUA). This EUA will remain in effect (meaning this test can be used) for the duration of the COVID-19 declaration under Section 564(b)(1) of the Act, 21 U.S.C. section 360bbb-3(b)(1), unless the authorization is terminated or revoked.  Performed at [facility], [address] [place], [place] [address]   Blood culture (routine x 2)     Status: None   Collection Time: 10/20/20  6:20 AM   Specimen: BLOOD  Result Value Ref Range Status   Specimen Description   Final    BLOOD LEFT ANTECUBITAL Performed at [facility], [address] [place], [place] [address]    Special Requests   Final    BOTTLES DRAWN AEROBIC ONLY Blood Culture results may not be optimal due to an inadequate volume of blood received in culture bottles Performed  at [facility], [address], [place], [place] [address]    Culture   Final    NO GROWTH 5 DAYS Performed at [facility], [address] [place], [place] [address]    Report Status 10/25/2020 FINAL  Final  Blood culture (routine x 2)     Status: None   Collection Time: 10/20/20  8:55 AM   Specimen: BLOOD  Result Value Ref Range Status   Specimen Description   Final    BLOOD RIGHT WRIST Performed at [facility], [address], [place], [place] [address]    Special Requests   Final    BOTTLES DRAWN AEROBIC AND ANAEROBIC  Blood Culture results may not be optimal due to an excessive volume of blood received in culture bottles Performed at [facility], [address], [place], [place] [address]    Culture   Final    NO GROWTH 5 DAYS Performed at [facility], [address] [place], [place] [address]    Report Status 10/25/2020 FINAL  Final  Surgical pcr screen     Status: None   Collection Time: 10/20/20  1:24 PM   Specimen: Nasal Mucosa; Nasal Swab  Result Value Ref Range Status   MRSA, PCR NEGATIVE NEGATIVE Final   Staphylococcus aureus NEGATIVE NEGATIVE Final    Comment: (NOTE) The Xpert SA Assay (FDA approved for NASAL specimens in patients 34 years of age and older), is one component of a comprehensive surveillance program. It is not intended to diagnose infection nor to guide or monitor treatment. Performed at [facility], [address], [place], [place] [address]   Aerobic/Anaerobic Culture (surgical/deep wound)     Status: None   Collection Time: 10/20/20  5:07 PM   Specimen: PATH Soft tissue  Result Value Ref Range Status   Specimen Description PERINEAL ABSCESS  Final   Special Requests DRAINAGE  Final   Gram Stain   Final    MODERATE WBC PRESENT,BOTH PMN AND MONONUCLEAR FEW GRAM POSITIVE COCCI IN PAIRS IN CHAINS MODERATE GRAM NEGATIVE RODS RARE GRAM POSITIVE RODS Performed at [facility], [address], [place], [place] [address]    Culture   Final    MODERATE BACTEROIDES ORALIS BETA LACTAMASE POSITIVE MODERATE FUSOBACTERIUM SPECIES    Report Status 10/25/2020 FINAL  Final  Aerobic Culture (superficial specimen)     Status: None   Collection Time: 10/20/20  5:07 PM   Specimen: Wound  Result Value Ref Range Status   Specimen Description WOUND PERINEAL ABSCESS  Final   Special Requests SWAB  Final   Gram Stain NO WBC SEEN NO ORGANISMS SEEN   Final   Culture   Final    FEW NORMAL SKIN FLORA Performed at [facility], [address] [place], [place] [address]    Report Status 10/23/2020 FINAL  Final  Culture, blood (routine x 2)     Status: None   Collection Time: 10/21/20  1:35 PM   Specimen: BLOOD RIGHT HAND  Result Value Ref Range Status   Specimen Description   Final    BLOOD RIGHT HAND Performed at [facility], [address], [place], [place] [address]    Special Requests   Final    BOTTLES DRAWN AEROBIC ONLY Blood Culture adequate volume Performed at [facility], [address] [place], [place] [address]    Culture   Final  NO GROWTH 5 DAYS Performed at [facility], [address] [place], [place] [address]    Report Status 10/26/2020 FINAL  Final  Culture, blood (routine x 2)     Status: None   Collection Time: 10/21/20  1:35 PM   Specimen: BLOOD  Result Value Ref Range Status   Specimen Description   Final    BLOOD RIGHT ANTECUBITAL Performed at [facility], [address], [place], [place] [address]    Special Requests   Final    BOTTLES DRAWN AEROBIC AND ANAEROBIC Blood Culture adequate volume Performed at [facility], [address] [place], [place] [address]    Culture   Final    NO GROWTH 5 DAYS Performed at [facility], [address], [place], [place] [address]    Report Status 10/26/2020 FINAL  Final    Radiology Reports CT ABDOMEN PELVIS W CONTRAST  Result Date: 10/20/2020 CLINICAL DATA:  Rectal pain, rectal abscess, leukocytosis EXAM: CT ABDOMEN AND PELVIS WITH CONTRAST TECHNIQUE: Multidetector CT imaging of the abdomen and pelvis was performed using the standard protocol following bolus administration of intravenous contrast. CONTRAST:  OMNIPAQUE IOHEXOL 300 MG/ML  SOLN COMPARISON:  07/20/2020, 07/26/2020 FINDINGS: Lower chest: The visualized lung bases are clear bilaterally. The visualized heart and pericardium are unremarkable. Hepatobiliary: No focal  liver abnormality is seen. No gallstones, gallbladder wall thickening, or biliary dilatation. Pancreas: Unremarkable Spleen: Unremarkable Adrenals/Urinary Tract: The adrenal glands are unremarkable. The kidneys are normal. The bladder is largely decompressed. Previously noted bladder wall thickening has improved in the interval. There is a defect seen at the bladder base involving the prostatic urethra in keeping with the given history of trans urethral resection. Stomach/Bowel: The stomach, small bowel, and large bowel are unremarkable. No free intraperitoneal gas or fluid. Vascular/Lymphatic: There is shotty bilateral external iliac lymphadenopathy, likely reactive in nature. No frankly pathologic adenopathy within the abdomen and pelvis. The vascular structures are unremarkable. Reproductive: As noted above, a defect is seen within the prostate gland in keeping with the given history of trans urethral resection. Other: Just inferior to the prostate gland, surrounding the expected membranous urethra is a recurrent perirectal abscess measuring 3.8 x 4.4 cm at axial image # 80/2. This component above the levator ani appears separate from 2 additional components involving the crus of the corpus cavernosa bilaterally. On the right, this measures 2.0 x 6.6 cm on axial image # 87/2. On the left, this multiloculated collection is less well-defined measuring roughly 1.5 x 4.1 cm at axial image # 86/2. Musculoskeletal: No acute bone abnormality. IMPRESSION: Recurrent multiloculated perirectal abscesses demonstrating components both above the levator ani as well as below the levator involving the crus of the corpus cavernosa bilaterally. Interval changes of trans urethral resection of the prostate with improved bladder wall thickening. Electronically Signed   By: Helyn Numbers MD   On: 10/20/2020 06:46     Time Spent in minutes  30     Laverna Peace M.D on 10/26/2020 at 9:36 PM  To page go to [email or web address] -  password [facility]

## 2020-10-26 NOTE — Plan of Care (Signed)
  Problem: Clinical Measurements: Goal: Will remain free from infection Outcome: Progressing   Problem: Activity: Goal: Risk for activity intolerance will decrease Outcome: Progressing   Problem: Pain Managment: Goal: General experience of comfort will improve Outcome: Progressing   

## 2020-10-27 ENCOUNTER — Inpatient Hospital Stay (HOSPITAL_COMMUNITY): Payer: Self-pay

## 2020-10-27 ENCOUNTER — Encounter (HOSPITAL_COMMUNITY): Payer: Self-pay | Admitting: Internal Medicine

## 2020-10-27 LAB — BASIC METABOLIC PANEL
Anion gap: 10 (ref 5–15)
BUN: 15 mg/dL (ref 6–20)
CO2: 28 mmol/L (ref 22–32)
Calcium: 9.4 mg/dL (ref 8.9–10.3)
Chloride: 98 mmol/L (ref 98–111)
Creatinine, Ser: 1.01 mg/dL (ref 0.61–1.24)
GFR, Estimated: >60 mL/min (ref >60)
Glucose, Bld: 92 mg/dL (ref 70–99)
Potassium: 4.2 mmol/L (ref 3.5–5.1)
Sodium: 136 mmol/L (ref 135–145)

## 2020-10-27 LAB — CBC
HCT: 42.3 % (ref 39.0–52.0)
Hemoglobin: 14 g/dL (ref 13.0–17.0)
MCH: 26.7 pg (ref 26.0–34.0)
MCHC: 33.1 g/dL (ref 30.0–36.0)
MCV: 80.7 fL (ref 80.0–100.0)
Platelets: 558 10*3/uL — ABNORMAL HIGH (ref 150–400)
RBC: 5.24 MIL/uL (ref 4.22–5.81)
RDW: 14.8 % (ref 11.5–15.5)
WBC: 6.6 10*3/uL (ref 4.0–10.5)
nRBC: 0 % (ref 0.0–0.2)

## 2020-10-27 MED ORDER — IOHEXOL 300 MG/ML  SOLN
100.0000 mL | Freq: Once | INTRAMUSCULAR | Status: AC | PRN
  Administered 2020-10-27: 100 mL via INTRAVENOUS

## 2020-10-27 NOTE — Plan of Care (Signed)
  Problem: Health Behavior/Discharge Planning: Goal: Ability to manage health-related needs will improve Outcome: Progressing   Problem: Clinical Measurements: Goal: Diagnostic test results will improve Outcome: Progressing   Problem: Elimination: Goal: Will not experience complications related to bowel motility Outcome: Progressing Goal: Will not experience complications related to urinary retention Outcome: Progressing   Problem: Safety: Goal: Ability to remain free from injury will improve Outcome: Progressing

## 2020-10-27 NOTE — Anesthesia Preprocedure Evaluation (Addendum)
Anesthesia Evaluation  Patient identified by MRN, date of birth, ID band Patient awake    Reviewed: Allergy & Precautions, NPO status , Patient's Chart, lab work & pertinent test results  Airway Mallampati: II  TM Distance: >3 FB Neck ROM: Full    Dental no notable dental hx. (+) Teeth Intact, Dental Advisory Given   Pulmonary Current Smoker and Patient abstained from smoking.,    Pulmonary exam normal breath sounds clear to auscultation       Cardiovascular Exercise Tolerance: Good negative cardio ROS Normal cardiovascular exam Rhythm:Regular Rate:Normal     Neuro/Psych negative neurological ROS     GI/Hepatic negative GI ROS, Neg liver ROS,   Endo/Other    Renal/GU Renal disease  negative genitourinary   Musculoskeletal negative musculoskeletal ROS (+)   Abdominal   Peds  Hematology negative hematology ROS (+)   Anesthesia Other Findings   Reproductive/Obstetrics                          Anesthesia Physical Anesthesia Plan  ASA: II  Anesthesia Plan: General   Post-op Pain Management:    Induction: Intravenous  PONV Risk Score and Plan: 2 and Treatment may vary due to age or medical condition, Ondansetron and Midazolam  Airway Management Planned: LMA  Additional Equipment: None  Intra-op Plan:   Post-operative Plan:   Informed Consent: I have reviewed the patients History and Physical, chart, labs and discussed the procedure including the risks, benefits and alternatives for the proposed anesthesia with the patient or authorized representative who has indicated his/her understanding and acceptance.     Dental advisory given  Plan Discussed with: CRNA and Anesthesiologist  Anesthesia Plan Comments: (GA)      Anesthesia Quick Evaluation

## 2020-10-27 NOTE — Consult Note (Signed)
Consult Note  Tyler [name] [birth year]  [id number].    Requesting MD: Dr. Roberto Scales Chief Complaint/Reason for Consult: perianal abscess  HPI:  Patient is a 35 year old male who is currently admitted to [facility] for recurrent prostate abscess (he has had 2 prior admissions: 10/4-10/8 with unsuccessful transurethral moving followed by transrectal drain placement with wound cultures growing strep angina gnosis, followed by hospitalization 10/19-2010/25 for sepsis due to perineal/right scrotal abscess requiring I&D). Returned to the hospital with peroneal discomfort 1/4 and was taken for I&D by urology. He over the last few days has developed worsening pain in left buttock and radiating down into L thigh. CT scan obtained which shows improvement in prostate abscess but now left perianal phlegmon. Patient is afebrile and without leukocytosis but has significant pain with trying to mobilize at all. Denies abdominal pain, nausea, vomiting, diarrhea, constipation. No significant PMH. He has had skin abscesses drained in the past but no hx of IBD or diabetes, no rectal trauma. HIV and GC chlamydia have all been negative. He is married and denies any new sexual partners. NKDA. He reports occasional alcohol use, smokes tobacco and denies illicit drug use.    ROS: Review of Systems  Constitutional: Negative for chills and fever.  Respiratory: Negative for shortness of breath and wheezing.   Cardiovascular: Negative for chest pain and palpitations.  Gastrointestinal: Negative for abdominal pain, constipation, diarrhea, nausea and vomiting.  Genitourinary: Negative for dysuria, frequency and urgency.       Perianal pain   All other systems reviewed and are negative.   History reviewed. No pertinent family history.  History reviewed. No pertinent past medical history.  Past Surgical History:  Procedure Laterality Date  . IRRIGATION AND DEBRIDEMENT ABSCESS N/A 08/04/2020   Procedure: IRRIGATION  AND DEBRIDEMENT PERINEAL ABSCESS, Lovett Sox;  Surgeon: Bjorn Pippin, MD;  Location: [facility];  Service: Urology;  Laterality: N/A;  . IRRIGATION AND DEBRIDEMENT ABSCESS N/A 10/20/2020   Procedure: IRRIGATION AND DEBRIDEMENT PERINEAL  ABSCESS;  Surgeon: Sebastian Ache, MD;  Location: [facility];  Service: Urology;  Laterality: N/A;  . TRANSURETHRAL RESECTION OF PROSTATE  07/21/2020   Procedure: TRANSURETHRAL RESECTION OF THE PROSTATE (TURP);  Surgeon: Rene Paci, MD;  Location: [facility];  Service: Urology;;  . Rodman Key RESECTION OF PROSTATE N/A 10/20/2020   Procedure: CYSTOSCOPY, TRANSURETHRAL RESECTION OF THE PROSTATE ABCESS(TURP);  Surgeon: Sebastian Ache, MD;  Location: [facility];  Service: Urology;  Laterality: N/A;    Social History:  reports that he has been smoking cigarettes. He has been smoking about 1.00 pack per day. He has never used smokeless tobacco. He reports current alcohol use. He reports current drug use. Drug: Marijuana.  Allergies: No Known Allergies  Medications Prior to Admission  Medication Sig Dispense Refill  . acetaminophen (TYLENOL) 325 MG tablet Take 2 tablets (650 mg total) by mouth every 6 (six) hours as needed for mild pain or fever (or Fever >/= 101). (Patient not taking: Reported on 10/20/2020)    . oxyCODONE (OXY IR/ROXICODONE) 5 MG immediate release tablet Take 1 tablet (5 mg total) by mouth every 8 (eight) hours as needed for moderate pain or severe pain. (Patient not taking: Reported on 10/20/2020) 10 tablet 0  . polyethylene glycol (MIRALAX / GLYCOLAX) 17 g packet Take 17 g by mouth 2 (two) times daily. (Patient not taking: Reported on 10/20/2020) 14 each 0  . senna (SENOKOT) 8.6 MG TABS tablet Take 2 tablets (17.2 mg total)  by mouth daily as needed for mild constipation or moderate constipation. (Patient not taking: Reported on 10/20/2020) 30 tablet 0    Blood pressure 138/74, pulse 84, temperature 98.3 F (36.8 C), resp. rate 20, height 6'  (1.829 m), weight 82.6 kg, SpO2 100 %. Physical Exam:  General: pleasant, WD, WN male who is laying in bed in NAD HEENT: head is normocephalic, atraumatic.  Sclera are noninjected.  PERRL.  Ears and nose without any masses or lesions.  Mouth is pink and moist Heart: regular, rate, and rhythm.  Normal s1,s2. No obvious murmurs, gallops, or rubs noted.  Palpable radial and pedal pulses bilaterally Lungs: CTAB, no wheezes, rhonchi, or rales noted.  Respiratory effort nonlabored Abd: soft, NT, ND, +BS, no masses, hernias, or organomegaly GU: penrose present in perineum, significant induration and tenderness of left buttock and perianal area MS: all 4 extremities are symmetrical with no cyanosis, clubbing, or edema. Skin: warm and dry with no masses, lesions, or rashes Neuro: Cranial nerves 2-12 grossly intact, sensation is normal throughout Psych: A&Ox3 with an appropriate affect.   Results for orders placed or performed during the hospital encounter of 10/20/20 (from the past 48 hour(s))  Creatinine, serum     Status: None   Collection Time: 10/26/20  5:39 AM  Result Value Ref Range   Creatinine, Ser 0.95 0.61 - 1.24 mg/dL   GFR, Estimated >76 >73 mL/min    Comment: (NOTE) Calculated using the CKD-EPI Creatinine Equation (2021) Performed at [facility], [address] [place], [place] [address]   CBC     Status: Abnormal   Collection Time: 10/26/20  9:58 AM  Result Value Ref Range   WBC 7.4 4.0 - 10.5 K/uL   RBC 5.02 4.22 - 5.81 MIL/uL   Hemoglobin 13.3 13.0 - 17.0 g/dL   HCT 90.2 40.9 - 73.5 %   MCV 81.7 80.0 - 100.0 fL   MCH 26.5 26.0 - 34.0 pg   MCHC 32.4 30.0 - 36.0 g/dL   RDW 32.9 92.4 - 26.8 %   Platelets 529 (H) 150 - 400 K/uL   nRBC 0.0 0.0 - 0.2 %    Comment: Performed at [facility], [address] [place], [place] [address]  Basic metabolic panel     Status: None   Collection Time: 10/27/20  5:45 AM  Result Value Ref Range    Sodium 136 135 - 145 mmol/L   Potassium 4.2 3.5 - 5.1 mmol/L   Chloride 98 98 - 111 mmol/L   CO2 28 22 - 32 mmol/L   Glucose, Bld 92 70 - 99 mg/dL    Comment: Glucose reference range applies only to samples taken after fasting for at least 8 hours.   BUN 15 6 - 20 mg/dL   Creatinine, Ser 2.22 0.61 - 1.24 mg/dL   Calcium 9.4 8.9 - 97.9 mg/dL   GFR, Estimated >89 >21 mL/min    Comment: (NOTE) Calculated using the CKD-EPI Creatinine Equation (2021)    Anion gap 10 5 - 15    Comment: Performed at [facility], [address], [place], [place] [address]  CBC     Status: Abnormal   Collection Time: 10/27/20  5:45 AM  Result Value Ref Range   WBC 6.6 4.0 - 10.5 K/uL   RBC 5.24 4.22 - 5.81 MIL/uL   Hemoglobin 14.0 13.0 - 17.0 g/dL   HCT 40.8 14.4 - 81.8 %   MCV 80.7 80.0 -  100.0 fL   MCH 26.7 26.0 - 34.0 pg   MCHC 33.1 30.0 - 36.0 g/dL   RDW 37.1 06.2 - 69.4 %   Platelets 558 (H) 150 - 400 K/uL   nRBC 0.0 0.0 - 0.2 %    Comment: Performed at [facility], [address] [place], [place] [address]   CT ABDOMEN PELVIS W CONTRAST  Result Date: 10/27/2020 CLINICAL DATA:  Post I and D of perineal/rectal abscess. Perineal pain. EXAM: CT ABDOMEN AND PELVIS WITH CONTRAST TECHNIQUE: Multidetector CT imaging of the abdomen and pelvis was performed using the standard protocol following bolus administration of intravenous contrast. CONTRAST:  OMNIPAQUE IOHEXOL 300 MG/ML  SOLN COMPARISON:  10/20/2020 FINDINGS: Lower chest: Bibasilar subsegmental atelectasis. Normal heart size without pericardial or pleural effusion. Hepatobiliary: Normal liver. Normal gallbladder, without biliary ductal dilatation. Pancreas: Normal, without mass or ductal dilatation. Spleen: Normal in size, without focal abnormality. Adrenals/Urinary Tract: Normal adrenal glands. Normal kidneys, without hydronephrosis. Normal urinary bladder. Stomach/Bowel: Normal stomach, without wall  thickening. Normal colon and terminal ileum. Normal small bowel. Vascular/Lymphatic: Normal caliber of the aorta and branch vessels. No abdominal adenopathy. Right inguinal node of 11 mm on 93/2 is similar to the 12 mm on the prior, favored to be reactive. Reproductive: Significantly improved appearance of the prostate. A drain is identified about the apex of the prostate. Trace fluid about the pelvic floor, just caudal to the apex of the prostate. Example 11 mm on 97/2. At the site of a 4.4 x 3.9 cm collection on the prior exam. Equivocal hypoattenuation just cephalad to this, in the region of the prostatic apex including on 94/2 is likely postoperative. Other: Soft tissue fullness within the ischioanal fossa. A left perianal collection is favored to represent phlegmon, measuring greater than fluid density centrally. Example 4.2 x 2.6 cm on 103/2. This is posterior and caudal to a smaller abscess on the prior exam. Right sided perianal fluid collection has resolved or been drained. Musculoskeletal: No acute osseous abnormality. IMPRESSION: 1. Since 10/20/2020, surgical debridement of periprosthetic and perianal abscesses. Trace residual periprostatic fluid with areas of subtle hypoattenuation which are likely postoperative. 2. Primarily resolved/drained perianal abscesses. A new or increased left perianal low-density collection is favored to represent phlegmon. Electronically Signed   By: Jeronimo Greaves M.D.   On: 10/27/2020 08:59      Assessment/Plan Tobacco use  Recurrent prostate/perineal abscess - s/p I&D and TURP by urology 1/4, per urology   Left perianal abscess - CT today shows 4.2 cm x 2.6 cm collection in left perianal region - no leukocytosis or fever, but cellulitis present on exam and significant ttp - will plan for I&D in the OR tomorrow   FEN: NPO after midnight tonight VTE: SCDs ID: [email or web address] 1/4; Zosyn 1/4>1/10, Vanc 1/4>1/9; unasyn 1/10>>  Juliet Rude, [facility] Surgery 10/27/2020, 1:13 PM Please see Amion for pager number during day hours 7:00am-4:30pm

## 2020-10-27 NOTE — Progress Notes (Signed)
[facility]  PROGRESS NOTE  Aragon Scarantino UTM:[id number] DOB: 12/10/1985 DOA: 10/20/2020 PCP: Patient, No Pcp Per Admit date - 10/20/2020   Admitting Physician Jae Dire, MD  Outpatient Primary MD for the patient is Patient, No Pcp Per  LOS - 7 Brief Narrative   Curley Hogen is a 35 y.o. year old male with medical history significant for Recurrent prostate abscess (he has had 2 prior admissions: 10/4-10/8 with unsuccessful transurethral moving followed by transrectal drain placement with wound cultures growing strep angina gnosis, followed by hospitalization 10/19-2010/25 for sepsis due to perineal/right scrotal abscess requiring I&D).  During last hospitalization patient underwent cystourethroscopy and I&D of abscess on 10/19 completed a week of empiric IV vancomycin, cefepime and Flagyl, blood cultures remain unremarkable at that time and abscess culture showed mixed anaerobic flora and was discharged 2 to 3 weeks of oral Augmentin.  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  He is admitted with working diagnosis sepsis secondary to recurrent multiloculated perirectal abscesses, given his history of polymicrobial abscesses he was started on empiric vancomycin and Zosyn and underwent operative I&D of perineal abscess by urology as well as transurethral resection of the prostate abscess on 1/4.  Hospital course complicated by persistent tachycardia slowly downtrending fever and persistent perineal/prostate pain.    Subjective  Feels pain controlled on regimen but it is still very limiting in movement in bed  A & P   Sepsis secondary to recurrent prostate and perineal abscess, status post TURP abscess and I&D of perineal abscess on 1/4, sepsis resolved. SIRS physiology now resolved.  But the  patient still has exquisite amount of pain in the posterior perineal area towards the left buttocks.   Intraoperative cultures positive for fusobacterium and bacteroides. Due to persistent pain obtained repeat CT scan on 10/27/20 shows new left perianal phlegmon --dc empirica abx for unasyn and monitor --discussed case with urology who recommend general surgery involvement for likely I and D given location more posterior to prostate and involving perianal area.  - Appreciate urology assistance, managing perineal Penrose drainage for perineal wound, Foley catheter removed -CBC daily -Optimize pain control discontinue IV morphine for IV dilaudid as needed severe pain,  Tylenol for mild, oxycodone for moderate as needed pain.  Optimize bowel regimen  Recurrent prostate/perineal abscesses, unclear risk factors.  Patient has no known history of immunosuppression.  Last HIV test in October was negative.  HIV antibody and GC chlamydia both negative  Tobacco use - Nicotine patch in place - Cessation emphasized on admission     Family Communication  : None  Code Status : Full  Disposition Plan  :  Patient is from home. Anticipated d/c date: 2 to 3 days. Barriers to d/c or necessity for inpatient status:  IV unaysn, , IV pain control for significant perineal pain Consults  : Urology, will consult general surgery  Procedures  : TURP and I&D of perineal abscess on 1/4  DVT Prophylaxis  : Heparin MDM: The below labs and imaging reports were reviewed and summarized above.  Medication management as above.  Lab Results  Component Value Date   PLT 558 (H) 10/27/2020    Diet :  Diet Order            Diet regular Room service appropriate? Yes; Fluid consistency: Thin  Diet  effective now                  Inpatient Medications Scheduled Meds: . Chlorhexidine Gluconate Cloth  6 each Topical Daily  . nicotine  21 mg Transdermal Daily  . polyethylene glycol  17 g Oral BID  . senna-docusate  2  tablet Oral BID   Continuous Infusions: . sodium chloride 10 mL/hr at 10/26/20 1400  . ampicillin-sulbactam (UNASYN) IV 3 g (10/27/20 0906)  . lactated ringers     PRN Meds:.sodium chloride, acetaminophen, HYDROmorphone (DILAUDID) injection, lip balm, ondansetron **OR** ondansetron (ZOFRAN) IV, oxyCODONE  Antibiotics  :   Anti-infectives (From admission, onward)   Start     Dose/Rate Route Frequency Ordered Stop   10/26/20 1400  Ampicillin-Sulbactam (UNASYN) 3 g in sodium chloride 0.9 % 100 mL IVPB        3 g 200 mL/hr over 30 Minutes Intravenous Every 6 hours 10/26/20 0840     10/25/20 2200  vancomycin (VANCOREADY) IVPB 1250 mg/250 mL  Status:  Discontinued        1,250 mg 166.7 mL/hr over 90 Minutes Intravenous Every 12 hours 10/25/20 1225 10/26/20 0840   10/20/20 2200  vancomycin (VANCOREADY) IVPB 1500 mg/300 mL  Status:  Discontinued        1,500 mg 150 mL/hr over 120 Minutes Intravenous Every 12 hours 10/20/20 1017 10/25/20 1225   10/20/20 1800  vancomycin (VANCOREADY) IVPB 1500 mg/300 mL  Status:  Discontinued        1,500 mg 150 mL/hr over 120 Minutes Intravenous Every 12 hours 10/20/20 1015 10/20/20 1017   10/20/20 1400  piperacillin-tazobactam (ZOSYN) IVPB 3.375 g  Status:  Discontinued        3.375 g 12.5 mL/hr over 240 Minutes Intravenous Every 8 hours 10/20/20 1015 10/26/20 0840   10/20/20 1200  vancomycin (VANCOREADY) IVPB 500 mg/100 mL        500 mg 100 mL/hr over 60 Minutes Intravenous  Once 10/20/20 1017 10/20/20 1620   10/20/20 0615  ceFEPIme (MAXIPIME) 2 g in sodium chloride 0.9 % 100 mL IVPB        2 g 200 mL/hr over 30 Minutes Intravenous STAT 10/20/20 0609 10/20/20 0936   10/20/20 0615  vancomycin (VANCOREADY) IVPB 1500 mg/300 mL        1,500 mg 150 mL/hr over 120 Minutes Intravenous STAT 10/20/20 0610 10/20/20 0815   10/20/20 0600  metroNIDAZOLE (FLAGYL) IVPB 500 mg        500 mg 100 mL/hr over 60 Minutes Intravenous  Once 10/20/20 0551 10/20/20 1035        Objective   Vitals:   10/26/20 1300 10/26/20 2105 10/27/20 0452 10/27/20 0516  BP: 126/85 130/78 121/73   Pulse: 95 (!) 107 65   Resp: 14 16 18    Temp: (!) 97.4 F (36.3 C) 98.9 F (37.2 C) 97.9 F (36.6 C)   TempSrc:  Oral    SpO2: 100% 98% 100%   Weight:    82.6 kg  Height:        SpO2: 100 % O2 Flow Rate (L/min): 2 L/min  Wt Readings from Last 3 Encounters:  10/27/20 82.6 kg  08/04/20 81.6 kg  07/21/20 85.9 kg     Intake/Output Summary (Last 24 hours) at 10/27/2020 1150 Last data filed at 10/27/2020 0900 Gross per 24 hour  Intake 1020.39 ml  Output 1700 ml  Net -679.61 ml    Physical Exam:   Awake Alert, Oriented X 3, Normal  affect, in pain whenever he moves No new F.N deficits,  [place].AT, Normal respiratory effort on room air, CTAB Regular rate regular rhythm,No Gallops,Rubs or new Murmurs,  +ve B.Sounds, Abd Soft, No tenderness, No rebound, guarding or rigidity. Penrose drain in place       I have personally reviewed the following:   Data Reviewed:  CBC Recent Labs  Lab 10/23/20 0606 10/24/20 0620 10/25/20 0629 10/26/20 0958 10/27/20 0545  WBC 11.7* 8.7 7.2 7.4 6.6  HGB 13.0 12.6* 14.9 13.3 14.0  HCT 40.0 38.7* 45.0 41.0 42.3  PLT 313 366 372 529* 558*  MCV 83.3 81.6 80.6 81.7 80.7  MCH 27.1 26.6 26.7 26.5 26.7  MCHC 32.5 32.6 33.1 32.4 33.1  RDW 14.7 14.8 14.8 15.0 14.8    Chemistries  Recent Labs  Lab 10/22/20 0707 10/23/20 0606 10/24/20 0620 10/25/20 0629 10/26/20 0539 10/27/20 0545  NA 134* 135 135 135  --  136  K 3.8 4.2 4.0 4.6  --  4.2  CL 98 99 101 100  --  98  CO2 25 26 25 23   --  28  GLUCOSE 96 109* 112* 105*  --  92  BUN 9 10 11 14   --  15  CREATININE 1.11 1.13 0.96 0.95 0.95 1.01  CALCIUM 8.7* 8.8* 8.9 9.2  --  9.4   ------------------------------------------------------------------------------------------------------------------ No results for input(s): CHOL, HDL, LDLCALC, TRIG, CHOLHDL, LDLDIRECT in the  last 72 hours.  No results found for: HGBA1C ------------------------------------------------------------------------------------------------------------------ No results for input(s): TSH, T4TOTAL, T3FREE, THYROIDAB in the last 72 hours.  Invalid input(s): FREET3 ------------------------------------------------------------------------------------------------------------------ No results for input(s): VITAMINB12, FOLATE, FERRITIN, TIBC, IRON, RETICCTPCT in the last 72 hours.  Coagulation profile Recent Labs  Lab 10/21/20 0029  INR 1.3*    No results for input(s): DDIMER in the last 72 hours.  Cardiac Enzymes No results for input(s): CKMB, TROPONINI, MYOGLOBIN in the last 168 hours.  Invalid input(s): CK ------------------------------------------------------------------------------------------------------------------ No results found for: BNP  Micro Results Recent Results (from the past 240 hour(s))  Urine culture     Status: None   Collection Time: 10/20/20  6:00 AM   Specimen: Urine, Clean Catch  Result Value Ref Range Status   Specimen Description   Final    URINE, CLEAN CATCH Performed at [facility], [address] [place], [place]    Special Requests   Final    NONE Performed at [facility], [address] [place], [place]    Culture   Final    NO GROWTH Performed at [facility], [address], [place], [address] [place]    Report Status 10/21/2020 FINAL  Final  Resp Panel by RT-PCR (Flu A&B, Covid) Nasopharyngeal Swab     Status: None   Collection Time: 10/20/20  6:18 AM   Specimen: Nasopharyngeal Swab; Nasopharyngeal(NP) swabs in vial transport medium  Result Value Ref Range Status   SARS Coronavirus 2 by RT PCR NEGATIVE NEGATIVE Final    Comment: (NOTE) SARS-CoV-2 target nucleic acids are NOT DETECTED.  The SARS-CoV-2 RNA is generally detectable in upper respiratory specimens during the  acute phase of infection. The lowest concentration of SARS-CoV-2 viral copies this assay can detect is 138 copies/mL. A negative result does not preclude SARS-Cov-2 infection and should not be used as the sole basis for treatment or other patient management decisions. A negative result may occur with  improper specimen collection/handling, submission of specimen other than nasopharyngeal swab,  presence of viral mutation(s) within the areas targeted by this assay, and inadequate number of viral copies(<138 copies/mL). A negative result must be combined with clinical observations, patient history, and epidemiological information. The expected result is Negative.  Fact Sheet for Patients:  BloggerCourse.comhttps://www.fda.gov/media/152166/download  Fact Sheet for Healthcare Providers:  SeriousBroker.ithttps://www.fda.gov/media/152162/download  This test is no t yet approved or cleared by the [place] FDA and  has been authorized for detection and/or diagnosis of SARS-CoV-2 by FDA under an Emergency Use Authorization (EUA). This EUA will remain  in effect (meaning this test can be used) for the duration of the COVID-19 declaration under Section 564(b)(1) of the Act, 21 U.S.C.section 360bbb-3(b)(1), unless the authorization is terminated  or revoked sooner.       Influenza A by PCR NEGATIVE NEGATIVE Final   Influenza B by PCR NEGATIVE NEGATIVE Final    Comment: (NOTE) The Xpert Xpress SARS-CoV-2/FLU/RSV plus assay is intended as an aid in the diagnosis of influenza from Nasopharyngeal swab specimens and should not be used as a sole basis for treatment. Nasal washings and aspirates are unacceptable for Xpert Xpress SARS-CoV-2/FLU/RSV testing.  Fact Sheet for Patients: BloggerCourse.comhttps://www.fda.gov/media/152166/download  Fact Sheet for Healthcare Providers: SeriousBroker.ithttps://www.fda.gov/media/152162/download  This test is not yet approved or cleared by the [place] FDA and has been authorized for detection and/or  diagnosis of SARS-CoV-2 by FDA under an Emergency Use Authorization (EUA). This EUA will remain in effect (meaning this test can be used) for the duration of the COVID-19 declaration under Section 564(b)(1) of the Act, 21 U.S.C. section 360bbb-3(b)(1), unless the authorization is terminated or revoked.  Performed at [facility], [address] [place], [place] [address]   Blood culture (routine x 2)     Status: None   Collection Time: 10/20/20  6:20 AM   Specimen: BLOOD  Result Value Ref Range Status   Specimen Description   Final    BLOOD LEFT ANTECUBITAL Performed at [facility], [address] [place], [place] [address]    Special Requests   Final    BOTTLES DRAWN AEROBIC ONLY Blood Culture results may not be optimal due to an inadequate volume of blood received in culture bottles Performed at [facility], [address] [place], [place] [address]    Culture   Final    NO GROWTH 5 DAYS Performed at [facility], [address] [place], [place] [address]    Report Status 10/25/2020 FINAL  Final  Blood culture (routine x 2)     Status: None   Collection Time: 10/20/20  8:55 AM   Specimen: BLOOD  Result Value Ref Range Status   Specimen Description   Final    BLOOD RIGHT WRIST Performed at [facility], [address] [place], [place] [address]    Special Requests   Final    BOTTLES DRAWN AEROBIC AND ANAEROBIC Blood Culture results may not be optimal due to an excessive volume of blood received in culture bottles Performed at [facility], [address] [place], [place] [address]    Culture   Final    NO GROWTH 5 DAYS Performed at [facility], [address] [place], [place] [address]    Report Status 10/25/2020 FINAL  Final  Surgical pcr screen     Status: None   Collection Time: 10/20/20  1:24 PM   Specimen: Nasal Mucosa; Nasal Swab  Result Value Ref  Range Status  MRSA, PCR NEGATIVE NEGATIVE Final   Staphylococcus aureus NEGATIVE NEGATIVE Final    Comment: (NOTE) The Xpert SA Assay (FDA approved for NASAL specimens in patients 33 years of age and older), is one component of a comprehensive surveillance program. It is not intended to diagnose infection nor to guide or monitor treatment. Performed at [facility], [address] [place], [place] [address]   Aerobic/Anaerobic Culture (surgical/deep wound)     Status: None   Collection Time: 10/20/20  5:07 PM   Specimen: PATH Soft tissue  Result Value Ref Range Status   Specimen Description PERINEAL ABSCESS  Final   Special Requests DRAINAGE  Final   Gram Stain   Final    MODERATE WBC PRESENT,BOTH PMN AND MONONUCLEAR FEW GRAM POSITIVE COCCI IN PAIRS IN CHAINS MODERATE GRAM NEGATIVE RODS RARE GRAM POSITIVE RODS Performed at [facility], [address] [place], [place] [address]    Culture   Final    MODERATE BACTEROIDES ORALIS BETA LACTAMASE POSITIVE MODERATE FUSOBACTERIUM SPECIES    Report Status 10/25/2020 FINAL  Final  Aerobic Culture (superficial specimen)     Status: None   Collection Time: 10/20/20  5:07 PM   Specimen: Wound  Result Value Ref Range Status   Specimen Description WOUND PERINEAL ABSCESS  Final   Special Requests SWAB  Final   Gram Stain NO WBC SEEN NO ORGANISMS SEEN   Final   Culture   Final    FEW NORMAL SKIN FLORA Performed at [facility], [address] [place], [place] [address]    Report Status 10/23/2020 FINAL  Final  Culture, blood (routine x 2)     Status: None   Collection Time: 10/21/20  1:35 PM   Specimen: BLOOD RIGHT HAND  Result Value Ref Range Status   Specimen Description   Final    BLOOD RIGHT HAND Performed at [facility], [address] [place], [place] [address]    Special Requests   Final    BOTTLES DRAWN AEROBIC ONLY Blood Culture adequate volume Performed at [facility], [address] [place], [place] [address]    Culture   Final    NO GROWTH 5 DAYS Performed at [facility], [address] [place], [place] [address]    Report Status 10/26/2020 FINAL  Final  Culture, blood (routine x 2)     Status: None   Collection Time: 10/21/20  1:35 PM   Specimen: BLOOD  Result Value Ref Range Status   Specimen Description   Final    BLOOD RIGHT ANTECUBITAL Performed at [facility], [address] [place], [place] [address]    Special Requests   Final    BOTTLES DRAWN AEROBIC AND ANAEROBIC Blood Culture adequate volume Performed at [facility], [address] [place], [place] [address]    Culture   Final    NO GROWTH 5 DAYS Performed at [facility], [address] [place], [place] [address]    Report Status 10/26/2020 FINAL  Final    Radiology Reports CT ABDOMEN PELVIS W CONTRAST  Result Date: 10/27/2020 CLINICAL DATA:  Post I and D of perineal/rectal abscess. Perineal pain. EXAM: CT ABDOMEN AND PELVIS WITH CONTRAST TECHNIQUE: Multidetector CT imaging of the abdomen and pelvis was performed using the standard protocol following bolus administration of intravenous contrast. CONTRAST:  100mL OMNIPAQUE IOHEXOL 300 MG/ML  SOLN COMPARISON:  10/20/2020 FINDINGS:  Lower chest: Bibasilar subsegmental atelectasis. Normal heart size without pericardial or pleural effusion. Hepatobiliary: Normal liver. Normal gallbladder, without biliary ductal dilatation. Pancreas: Normal, without mass or ductal dilatation. Spleen: Normal in size, without focal abnormality. Adrenals/Urinary Tract: Normal adrenal glands. Normal kidneys, without hydronephrosis. Normal urinary bladder. Stomach/Bowel: Normal stomach, without wall thickening. Normal colon and terminal ileum. Normal small bowel. Vascular/Lymphatic: Normal caliber of the aorta and branch vessels. No abdominal adenopathy. Right inguinal node of 11 mm  on 93/2 is similar to the 12 mm on the prior, favored to be reactive. Reproductive: Significantly improved appearance of the prostate. A drain is identified about the apex of the prostate. Trace fluid about the pelvic floor, just caudal to the apex of the prostate. Example 11 mm on 97/2. At the site of a 4.4 x 3.9 cm collection on the prior exam. Equivocal hypoattenuation just cephalad to this, in the region of the prostatic apex including on 94/2 is likely postoperative. Other: Soft tissue fullness within the ischioanal fossa. A left perianal collection is favored to represent phlegmon, measuring greater than fluid density centrally. Example 4.2 x 2.6 cm on 103/2. This is posterior and caudal to a smaller abscess on the prior exam. Right sided perianal fluid collection has resolved or been drained. Musculoskeletal: No acute osseous abnormality. IMPRESSION: 1. Since 10/20/2020, surgical debridement of periprosthetic and perianal abscesses. Trace residual periprostatic fluid with areas of subtle hypoattenuation which are likely postoperative. 2. Primarily resolved/drained perianal abscesses. A new or increased left perianal low-density collection is favored to represent phlegmon. Electronically Signed   By: Jeronimo Greaves M.D.   On: 10/27/2020 08:59   CT ABDOMEN PELVIS W CONTRAST  Result Date: 10/20/2020 CLINICAL DATA:  Rectal pain, rectal abscess, leukocytosis EXAM: CT ABDOMEN AND PELVIS WITH CONTRAST TECHNIQUE: Multidetector CT imaging of the abdomen and pelvis was performed using the standard protocol following bolus administration of intravenous contrast. CONTRAST:  OMNIPAQUE IOHEXOL 300 MG/ML  SOLN COMPARISON:  07/20/2020, 07/26/2020 FINDINGS: Lower chest: The visualized lung bases are clear bilaterally. The visualized heart and pericardium are unremarkable. Hepatobiliary: No focal liver abnormality is seen. No gallstones, gallbladder wall thickening, or biliary dilatation. Pancreas: Unremarkable Spleen:  Unremarkable Adrenals/Urinary Tract: The adrenal glands are unremarkable. The kidneys are normal. The bladder is largely decompressed. Previously noted bladder wall thickening has improved in the interval. There is a defect seen at the bladder base involving the prostatic urethra in keeping with the given history of trans urethral resection. Stomach/Bowel: The stomach, small bowel, and large bowel are unremarkable. No free intraperitoneal gas or fluid. Vascular/Lymphatic: There is shotty bilateral external iliac lymphadenopathy, likely reactive in nature. No frankly pathologic adenopathy within the abdomen and pelvis. The vascular structures are unremarkable. Reproductive: As noted above, a defect is seen within the prostate gland in keeping with the given history of trans urethral resection. Other: Just inferior to the prostate gland, surrounding the expected membranous urethra is a recurrent perirectal abscess measuring 3.8 x 4.4 cm at axial image # 80/2. This component above the levator ani appears separate from 2 additional components involving the crus of the corpus cavernosa bilaterally. On the right, this measures 2.0 x 6.6 cm on axial image # 87/2. On the left, this multiloculated collection is less well-defined measuring roughly 1.5 x 4.1 cm at axial image # 86/2. Musculoskeletal: No acute bone abnormality. IMPRESSION: Recurrent multiloculated perirectal abscesses demonstrating components both above the levator ani as well as below the levator involving the crus of the corpus cavernosa  bilaterally. Interval changes of trans urethral resection of the prostate with improved bladder wall thickening. Electronically Signed   By: Helyn Numbers MD   On: 10/20/2020 06:46     Time Spent in minutes  30     Laverna Peace M.D on 10/27/2020 at 11:50 AM  To page go to [email or web address] - password [email or web address]

## 2020-10-27 NOTE — Plan of Care (Signed)
  Problem: Education: Goal: Knowledge of General Education information will improve Description: Including pain rating scale, medication(s)/side effects and non-pharmacologic comfort measures Outcome: Progressing   Problem: Clinical Measurements: Goal: Ability to maintain clinical measurements within normal limits will improve Outcome: Progressing Goal: Cardiovascular complication will be avoided Outcome: Progressing   Problem: Elimination: Goal: Will not experience complications related to urinary retention Outcome: Progressing

## 2020-10-28 ENCOUNTER — Inpatient Hospital Stay (HOSPITAL_COMMUNITY): Payer: Self-pay | Admitting: Anesthesiology

## 2020-10-28 ENCOUNTER — Encounter (HOSPITAL_COMMUNITY)
Admission: EM | Disposition: A | Discharge status: Home / Self Care | Payer: Self-pay | Source: Home / Self Care | Attending: Internal Medicine

## 2020-10-28 ENCOUNTER — Encounter (HOSPITAL_COMMUNITY): Payer: Self-pay | Admitting: Internal Medicine

## 2020-10-28 HISTORY — PX: IRRIGATION AND DEBRIDEMENT BUTTOCKS: SHX6601

## 2020-10-28 LAB — CBC WITH DIFFERENTIAL/PLATELET
Abs Immature Granulocytes: 0.02 10*3/uL (ref 0.00–0.07)
Basophils Absolute: 0 10*3/uL (ref 0.0–0.1)
Basophils Relative: 1 %
Eosinophils Absolute: 0.1 10*3/uL (ref 0.0–0.5)
Eosinophils Relative: 3 %
HCT: 41.2 % (ref 39.0–52.0)
Hemoglobin: 13.2 g/dL (ref 13.0–17.0)
Immature Granulocytes: 0 %
Lymphocytes Relative: 34 %
Lymphs Abs: 1.9 10*3/uL (ref 0.7–4.0)
MCH: 26.5 pg (ref 26.0–34.0)
MCHC: 32 g/dL (ref 30.0–36.0)
MCV: 82.6 fL (ref 80.0–100.0)
Monocytes Absolute: 0.6 10*3/uL (ref 0.1–1.0)
Monocytes Relative: 11 %
Neutro Abs: 2.8 10*3/uL (ref 1.7–7.7)
Neutrophils Relative %: 51 %
Platelets: 545 10*3/uL — ABNORMAL HIGH (ref 150–400)
RBC: 4.99 MIL/uL (ref 4.22–5.81)
RDW: 15 % (ref 11.5–15.5)
WBC: 5.5 10*3/uL (ref 4.0–10.5)
nRBC: 0 % (ref 0.0–0.2)

## 2020-10-28 LAB — BASIC METABOLIC PANEL
Anion gap: 11 (ref 5–15)
BUN: 13 mg/dL (ref 6–20)
CO2: 26 mmol/L (ref 22–32)
Calcium: 9.2 mg/dL (ref 8.9–10.3)
Chloride: 100 mmol/L (ref 98–111)
Creatinine, Ser: 0.89 mg/dL (ref 0.61–1.24)
GFR, Estimated: >60 mL/min (ref >60)
Glucose, Bld: 92 mg/dL (ref 70–99)
Potassium: 4.2 mmol/L (ref 3.5–5.1)
Sodium: 137 mmol/L (ref 135–145)

## 2020-10-28 SURGERY — IRRIGATION AND DEBRIDEMENT BUTTOCKS
Anesthesia: General | Site: Perineum

## 2020-10-28 MED ORDER — DEXAMETHASONE SODIUM PHOSPHATE 4 MG/ML IJ SOLN
INTRAMUSCULAR | Status: DC | PRN
  Administered 2020-10-28: 4 mg via INTRAVENOUS

## 2020-10-28 MED ORDER — MIDAZOLAM HCL 2 MG/2ML IJ SOLN
INTRAMUSCULAR | Status: AC
  Filled 2020-10-28: qty 2

## 2020-10-28 MED ORDER — FENTANYL CITRATE (PF) 100 MCG/2ML IJ SOLN
INTRAMUSCULAR | Status: DC | PRN
  Administered 2020-10-28 (×4): 50 ug via INTRAVENOUS

## 2020-10-28 MED ORDER — OXYCODONE HCL 5 MG/5ML PO SOLN: 5.0000 mg | Freq: Once | ORAL | Status: DC | PRN

## 2020-10-28 MED ORDER — ONDANSETRON HCL 4 MG/2ML IJ SOLN: 4.0000 mg | Freq: Once | INTRAMUSCULAR | Status: DC | PRN

## 2020-10-28 MED ORDER — BUPIVACAINE HCL 0.25 % IJ SOLN
INTRAMUSCULAR | Status: AC
  Filled 2020-10-28: qty 1

## 2020-10-28 MED ORDER — DEXMEDETOMIDINE (PRECEDEX) IN NS 20 MCG/5ML (4 MCG/ML) IV SYRINGE
PREFILLED_SYRINGE | INTRAVENOUS | Status: DC | PRN
  Administered 2020-10-28: 4 ug via INTRAVENOUS
  Administered 2020-10-28: 12 ug via INTRAVENOUS
  Administered 2020-10-28: 4 ug via INTRAVENOUS

## 2020-10-28 MED ORDER — PROPOFOL 10 MG/ML IV BOLUS
INTRAVENOUS | Status: DC | PRN
  Administered 2020-10-28: 200 mg via INTRAVENOUS

## 2020-10-28 MED ORDER — AMISULPRIDE (ANTIEMETIC) 5 MG/2ML IV SOLN: 10.0000 mg | Freq: Once | INTRAVENOUS | Status: DC | PRN

## 2020-10-28 MED ORDER — KETOROLAC TROMETHAMINE 30 MG/ML IJ SOLN
30.0000 mg | Freq: Once | INTRAMUSCULAR | Status: AC | PRN
  Administered 2020-10-28: 30 mg via INTRAVENOUS

## 2020-10-28 MED ORDER — HYDROMORPHONE HCL 1 MG/ML IJ SOLN
INTRAMUSCULAR | Status: AC
  Filled 2020-10-28: qty 1

## 2020-10-28 MED ORDER — HYDROMORPHONE HCL 1 MG/ML IJ SOLN
0.2500 mg | INTRAMUSCULAR | Status: DC | PRN
  Administered 2020-10-28: 0.25 mg via INTRAVENOUS

## 2020-10-28 MED ORDER — LIDOCAINE HCL (CARDIAC) PF 100 MG/5ML IV SOSY: PREFILLED_SYRINGE | INTRAVENOUS | Status: DC | PRN

## 2020-10-28 MED ORDER — LIDOCAINE 2% (20 MG/ML) 5 ML SYRINGE
INTRAMUSCULAR | Status: DC | PRN
  Administered 2020-10-28: 100 mg via INTRAVENOUS

## 2020-10-28 MED ORDER — PHENYLEPHRINE 40 MCG/ML (10ML) SYRINGE FOR IV PUSH (FOR BLOOD PRESSURE SUPPORT)
PREFILLED_SYRINGE | INTRAVENOUS | Status: DC | PRN
  Administered 2020-10-28: 120 ug via INTRAVENOUS

## 2020-10-28 MED ORDER — BUPIVACAINE HCL (PF) 0.5 % IJ SOLN
INTRAMUSCULAR | Status: DC | PRN
  Administered 2020-10-28: 20 mL

## 2020-10-28 MED ORDER — 0.9 % SODIUM CHLORIDE (POUR BTL) OPTIME
TOPICAL | Status: DC | PRN
  Administered 2020-10-28: 1000 mL

## 2020-10-28 MED ORDER — FENTANYL CITRATE (PF) 100 MCG/2ML IJ SOLN
INTRAMUSCULAR | Status: AC
  Filled 2020-10-28: qty 2

## 2020-10-28 MED ORDER — ONDANSETRON HCL 4 MG/2ML IJ SOLN
INTRAMUSCULAR | Status: DC | PRN
  Administered 2020-10-28: 4 mg via INTRAVENOUS

## 2020-10-28 MED ORDER — MIDAZOLAM HCL 5 MG/5ML IJ SOLN
INTRAMUSCULAR | Status: DC | PRN
  Administered 2020-10-28: 2 mg via INTRAVENOUS

## 2020-10-28 MED ORDER — PHENYLEPHRINE 40 MCG/ML (10ML) SYRINGE FOR IV PUSH (FOR BLOOD PRESSURE SUPPORT)
PREFILLED_SYRINGE | INTRAVENOUS | Status: AC
  Filled 2020-10-28: qty 10

## 2020-10-28 MED ORDER — LACTATED RINGERS IV SOLN: INTRAVENOUS | Status: DC | PRN

## 2020-10-28 MED ORDER — KETOROLAC TROMETHAMINE 30 MG/ML IJ SOLN
INTRAMUSCULAR | Status: AC
  Filled 2020-10-28: qty 1

## 2020-10-28 MED ORDER — OXYCODONE HCL 5 MG PO TABS: 5.0000 mg | ORAL_TABLET | Freq: Once | ORAL | Status: DC | PRN

## 2020-10-28 MED ORDER — MEPERIDINE HCL 50 MG/ML IJ SOLN: 6.2500 mg | INTRAMUSCULAR | Status: DC | PRN

## 2020-10-28 MED ORDER — BUPIVACAINE LIPOSOME 1.3 % IJ SUSP
20.0000 mL | Freq: Once | INTRAMUSCULAR | Status: AC
  Administered 2020-10-28: 20 mL
  Filled 2020-10-28: qty 20

## 2020-10-28 SURGICAL SUPPLY — 28 items
BLADE HEX COATED 2.75 (ELECTRODE) ×2 IMPLANT
BLADE SURG 15 STRL LF DISP TIS (BLADE) ×1 IMPLANT
BLADE SURG 15 STRL SS (BLADE) ×1
COVER WAND RF STERILE (DRAPES) IMPLANT
DRSG PAD ABDOMINAL 8X10 ST (GAUZE/BANDAGES/DRESSINGS) ×2 IMPLANT
ELECT REM PT RETURN 15FT ADLT (MISCELLANEOUS) ×2 IMPLANT
GAUZE 4X4 16PLY RFD (DISPOSABLE) ×2 IMPLANT
GAUZE PACKING 2X5 YD STRL (GAUZE/BANDAGES/DRESSINGS) ×2 IMPLANT
GAUZE PACKING IODOFORM 1/4X15 (PACKING) IMPLANT
GAUZE SPONGE 4X4 12PLY STRL (GAUZE/BANDAGES/DRESSINGS) ×2 IMPLANT
GLOVE BIO SURGEON STRL SZ7.5 (GLOVE) ×2 IMPLANT
GLOVE SURG ENC MOIS LTX SZ7 (GLOVE) ×4 IMPLANT
GLOVE SURG UNDER POLY LF SZ7 (GLOVE) ×2 IMPLANT
GOWN STRL REUS W/TWL LRG LVL3 (GOWN DISPOSABLE) ×2 IMPLANT
GOWN STRL REUS W/TWL XL LVL3 (GOWN DISPOSABLE) ×4 IMPLANT
KIT BASIN OR (CUSTOM PROCEDURE TRAY) ×2 IMPLANT
KIT TURNOVER KIT A (KITS) IMPLANT
NEEDLE HYPO 25X1 1.5 SAFETY (NEEDLE) IMPLANT
PACK LITHOTOMY IV (CUSTOM PROCEDURE TRAY) ×2 IMPLANT
PENCIL SMOKE EVACUATOR (MISCELLANEOUS) IMPLANT
SOL PREP PROV IODINE SCRUB 4OZ (MISCELLANEOUS) ×2 IMPLANT
SURGILUBE 2OZ TUBE FLIPTOP (MISCELLANEOUS) ×2 IMPLANT
SUT CHROMIC 2 0 SH (SUTURE) IMPLANT
SUT CHROMIC 3 0 SH 27 (SUTURE) IMPLANT
SWAB COLLECTION DEVICE MRSA (MISCELLANEOUS) IMPLANT
SYR CONTROL 10ML LL (SYRINGE) IMPLANT
TOWEL OR 17X26 10 PK STRL BLUE (TOWEL DISPOSABLE) ×2 IMPLANT
UNDERPAD 30X36 HEAVY ABSORB (UNDERPADS AND DIAPERS) IMPLANT

## 2020-10-28 NOTE — Progress Notes (Signed)
PROGRESS NOTE    Tyler CoopDerek [name]  WJX:[id number] DOB: 10/03/1986 DOA: 10/20/2020 PCP: Patient, No Pcp Per     Brief Narrative:  Tyler CoopDerek [name] is a 35 y.o. year old male with medical history significant for recurrent prostate abscess (he has had 2 prior admissions: 10/4-10/8 with unsuccessful transurethral moving followed by transrectal drain placement with wound cultures growing strep angina gnosis, followed by hospitalization 10/19-10/25 for sepsis due to perineal/right scrotal abscess requiring I&D).  During last hospitalization patient underwent cystourethroscopy and I&D of abscess on 10/19, completed a week of empiric IV vancomycin, cefepime and Flagyl, blood cultures remain unremarkable at that time and abscess culture showed mixed anaerobic flora and was discharged 2 to 3 weeks of oral Augmentin.  Patient presented on 1/4 with recurrent episode of peroneal discomfort starting since Christmas, pain radiating to right testicle with associated nausea, chills and night sweats and fever of 103 at home.  He was admitted for sepsis secondary to recurrent multiloculated perirectal abscesses, given his history of polymicrobial abscesses he was started on empiric vancomycin and Zosyn and underwent operative I&D of perineal abscess by urology as well as transurethral resection of the prostate abscess on 1/4.  Repeat CT on 1/11 showed new left perianal phlegmon.  General surgery was consulted.  New events last 24 hours / Subjective: Very angry regarding his n.p.o. status, states that he did not need to be n.p.o. since midnight, but only 6 to 8 hours prior to his surgery.  Does not answer any other medical questions pertaining to his exam today  Assessment & Plan:   Principal Problem:   Sepsis (HCC) Active Problems:   Perineal abscess   Tobacco abuse   Sepsis secondary to recurrent peri-prostatic and perineal abscess status post TURP and I&D of perineal abscess on 1/4 -Intraoperative wound culture  positive for Fusobacterium, Bacteroides -Repeat CT scan revealed new left perianal phlegmon, general surgery planning on I&D 1/12 -Appreciate urology -On Unasyn -Pain control  Tobacco use -Nicotine patch, cessation counseling   DVT prophylaxis: SCDs   Code Status: Full code Family Communication: No family at bedside Disposition Plan:  Status is: Inpatient  Remains inpatient appropriate because:IV treatments appropriate due to intensity of illness or inability to take PO and Inpatient level of care appropriate due to severity of illness   Dispo: The patient is from: Home              Anticipated d/c is to: Home              Anticipated d/c date is: 2 days              Patient currently is not medically stable to d/c.  OR today for I&D of perineal abscess.  Remains on IV antibiotics.    Antimicrobials:  Anti-infectives (From admission, onward)   Start     Dose/Rate Route Frequency Ordered Stop   10/26/20 1400  [MAR Hold]  Ampicillin-Sulbactam (UNASYN) 3 g in sodium chloride 0.9 % 100 mL IVPB        (MAR Hold since Wed 10/28/2020 at 1016.Hold Reason: Transfer to a Procedural area.)   3 g 200 mL/hr over 30 Minutes Intravenous Every 6 hours 10/26/20 0840     10/25/20 2200  vancomycin (VANCOREADY) IVPB 1250 mg/250 mL  Status:  Discontinued        1,250 mg 166.7 mL/hr over 90 Minutes Intravenous Every 12 hours 10/25/20 1225 10/26/20 0840   10/20/20 2200  vancomycin (VANCOREADY) IVPB 1500 mg/300 mL  Status:  Discontinued        1,500 mg 150 mL/hr over 120 Minutes Intravenous Every 12 hours 10/20/20 1017 10/25/20 1225   10/20/20 1800  vancomycin (VANCOREADY) IVPB 1500 mg/300 mL  Status:  Discontinued        1,500 mg 150 mL/hr over 120 Minutes Intravenous Every 12 hours 10/20/20 1015 10/20/20 1017   10/20/20 1400  piperacillin-tazobactam (ZOSYN) IVPB 3.375 g  Status:  Discontinued        3.375 g 12.5 mL/hr over 240 Minutes Intravenous Every 8 hours 10/20/20 1015 10/26/20 0840    10/20/20 1200  vancomycin (VANCOREADY) IVPB 500 mg/100 mL        500 mg 100 mL/hr over 60 Minutes Intravenous  Once 10/20/20 1017 10/20/20 1620   10/20/20 0615  ceFEPIme (MAXIPIME) 2 g in sodium chloride 0.9 % 100 mL IVPB        2 g 200 mL/hr over 30 Minutes Intravenous STAT 10/20/20 0609 10/20/20 0936   10/20/20 0615  vancomycin (VANCOREADY) IVPB 1500 mg/300 mL        1,500 mg 150 mL/hr over 120 Minutes Intravenous STAT 10/20/20 0610 10/20/20 0815   10/20/20 0600  metroNIDAZOLE (FLAGYL) IVPB 500 mg        500 mg 100 mL/hr over 60 Minutes Intravenous  Once 10/20/20 0551 10/20/20 1035        Objective: Vitals:   10/27/20 1240 10/27/20 2117 10/28/20 0450 10/28/20 1018  BP: 138/74 125/76 108/76 111/65  Pulse: 84 100 (!) 55 73  Resp: 20 18 18 20   Temp: 98.3 F (36.8 C) 98.2 F (36.8 C) 97.9 F (36.6 C)   TempSrc:      SpO2: 100% 100% 100% 100%  Weight:      Height:        Intake/Output Summary (Last 24 hours) at 10/28/2020 1131 Last data filed at 10/28/2020 12/26/2020 Gross per 24 hour  Intake 2189.58 ml  Output 1975 ml  Net 214.58 ml   Filed Weights   10/25/20 0500 10/26/20 0500 10/27/20 0516  Weight: 86.1 kg 84.4 kg 82.6 kg    Examination:  General exam: Appears calm and comfortable  Respiratory system: Clear to auscultation. Respiratory effort normal. No respiratory distress.  Cardiovascular system: S1 & S2 heard, RRR. No murmurs.  Gastrointestinal system: Abdomen is nondistended, soft Central nervous system: Alert and oriented. No focal neurological deficits. Speech clear.  Extremities: Symmetric in appearance   Data Reviewed: I have personally reviewed following labs and imaging studies  CBC: Recent Labs  Lab 10/24/20 0620 10/25/20 0629 10/26/20 0958 10/27/20 0545 10/28/20 0557  WBC 8.7 7.2 7.4 6.6 5.5  NEUTROABS  --   --   --   --  2.8  HGB 12.6* 14.9 13.3 14.0 13.2  HCT 38.7* 45.0 41.0 42.3 41.2  MCV 81.6 80.6 81.7 80.7 82.6  PLT 366 372 529* 558* 545*    Basic Metabolic Panel: Recent Labs  Lab 10/23/20 0606 10/24/20 0620 10/25/20 0629 10/26/20 0539 10/27/20 0545 10/28/20 0557  NA 135 135 135  --  136 137  K 4.2 4.0 4.6  --  4.2 4.2  CL 99 101 100  --  98 100  CO2 26 25 23   --  28 26  GLUCOSE 109* 112* 105*  --  92 92  BUN 10 11 14   --  15 13  CREATININE 1.13 0.96 0.95 0.95 1.01 0.89  CALCIUM 8.8* 8.9 9.2  --  9.4 9.2   GFR: Estimated  Creatinine Clearance: 130.8 mL/min (by C-G formula based on SCr of 0.89 mg/dL). Liver Function Tests: No results for input(s): AST, ALT, ALKPHOS, BILITOT, PROT, ALBUMIN in the last 168 hours. No results for input(s): LIPASE, AMYLASE in the last 168 hours. No results for input(s): AMMONIA in the last 168 hours. Coagulation Profile: No results for input(s): INR, PROTIME in the last 168 hours. Cardiac Enzymes: No results for input(s): CKTOTAL, CKMB, CKMBINDEX, TROPONINI in the last 168 hours. BNP (last 3 results) No results for input(s): PROBNP in the last 8760 hours. HbA1C: No results for input(s): HGBA1C in the last 72 hours. CBG: Recent Labs  Lab 10/23/20 2205  GLUCAP 106*   Lipid Profile: No results for input(s): CHOL, HDL, LDLCALC, TRIG, CHOLHDL, LDLDIRECT in the last 72 hours. Thyroid Function Tests: No results for input(s): TSH, T4TOTAL, FREET4, T3FREE, THYROIDAB in the last 72 hours. Anemia Panel: No results for input(s): VITAMINB12, FOLATE, FERRITIN, TIBC, IRON, RETICCTPCT in the last 72 hours. Sepsis Labs: No results for input(s): PROCALCITON, LATICACIDVEN in the last 168 hours.  Recent Results (from the past 240 hour(s))  Urine culture     Status: None   Collection Time: 10/20/20  6:00 AM   Specimen: Urine, Clean Catch  Result Value Ref Range Status   Specimen Description   Final    URINE, CLEAN CATCH Performed at [facility], [address] [place], [place] [address]    Special Requests   Final    NONE Performed at [facility],  [address] [place], [place] [address]    Culture   Final    NO GROWTH Performed at [facility], [address], [place], [place] [address]    Report Status 10/21/2020 FINAL  Final  Resp Panel by RT-PCR (Flu A&B, Covid) Nasopharyngeal Swab     Status: None   Collection Time: 10/20/20  6:18 AM   Specimen: Nasopharyngeal Swab; Nasopharyngeal(NP) swabs in vial transport medium  Result Value Ref Range Status   SARS Coronavirus 2 by RT PCR NEGATIVE NEGATIVE Final    Comment: (NOTE) SARS-CoV-2 target nucleic acids are NOT DETECTED.  The SARS-CoV-2 RNA is generally detectable in upper respiratory specimens during the acute phase of infection. The lowest concentration of SARS-CoV-2 viral copies this assay can detect is 138 copies/mL. A negative result does not preclude SARS-Cov-2 infection and should not be used as the sole basis for treatment or other patient management decisions. A negative result may occur with  improper specimen collection/handling, submission of specimen other than nasopharyngeal swab, presence of viral mutation(s) within the areas targeted by this assay, and inadequate number of viral copies(<138 copies/mL). A negative result must be combined with clinical observations, patient history, and epidemiological information. The expected result is Negative.  Fact Sheet for Patients:  BloggerCourse.com  Fact Sheet for Healthcare Providers:  SeriousBroker.it  This test is no t yet approved or cleared by the [place] FDA and  has been authorized for detection and/or diagnosis of SARS-CoV-2 by FDA under an Emergency Use Authorization (EUA). This EUA will remain  in effect (meaning this test can be used) for the duration of the COVID-19 declaration under Section 564(b)(1) of the Act, 21 U.S.C.section 360bbb-3(b)(1), unless the authorization is terminated  or revoked sooner.       Influenza A by PCR  NEGATIVE NEGATIVE Final   Influenza B by PCR NEGATIVE NEGATIVE Final    Comment: (NOTE) The Xpert Xpress SARS-CoV-2/FLU/RSV plus assay is intended  as an aid in the diagnosis of influenza from Nasopharyngeal swab specimens and should not be used as a sole basis for treatment. Nasal washings and aspirates are unacceptable for Xpert Xpress SARS-CoV-2/FLU/RSV testing.  Fact Sheet for Patients: BloggerCourse.comhttps://www.fda.gov/media/152166/download  Fact Sheet for Healthcare Providers: SeriousBroker.ithttps://www.fda.gov/media/152162/download  This test is not yet approved or cleared by the [place] FDA and has been authorized for detection and/or diagnosis of SARS-CoV-2 by FDA under an Emergency Use Authorization (EUA). This EUA will remain in effect (meaning this test can be used) for the duration of the COVID-19 declaration under Section 564(b)(1) of the Act, 21 U.S.C. section 360bbb-3(b)(1), unless the authorization is terminated or revoked.  Performed at [facility], [address] [place], [place] [address]   Blood culture (routine x 2)     Status: None   Collection Time: 10/20/20  6:20 AM   Specimen: BLOOD  Result Value Ref Range Status   Specimen Description   Final    BLOOD LEFT ANTECUBITAL Performed at [facility], [address] [place], [place] [address]    Special Requests   Final    BOTTLES DRAWN AEROBIC ONLY Blood Culture results may not be optimal due to an inadequate volume of blood received in culture bottles Performed at [facility], [address] [place], [place] [address]    Culture   Final    NO GROWTH 5 DAYS Performed at [facility], [address] [place], [place] [address]    Report Status 10/25/2020 FINAL  Final  Blood culture (routine x 2)     Status: None   Collection Time: 10/20/20  8:55 AM   Specimen: BLOOD  Result Value Ref Range Status   Specimen Description   Final    BLOOD RIGHT  WRIST Performed at [facility], [address] [place], [place] [address]    Special Requests   Final    BOTTLES DRAWN AEROBIC AND ANAEROBIC Blood Culture results may not be optimal due to an excessive volume of blood received in culture bottles Performed at [facility], [address] [place], [place] [address]    Culture   Final    NO GROWTH 5 DAYS Performed at [facility], [address] [place], [place] [address]    Report Status 10/25/2020 FINAL  Final  Surgical pcr screen     Status: None   Collection Time: 10/20/20  1:24 PM   Specimen: Nasal Mucosa; Nasal Swab  Result Value Ref Range Status   MRSA, PCR NEGATIVE NEGATIVE Final   Staphylococcus aureus NEGATIVE NEGATIVE Final    Comment: (NOTE) The Xpert SA Assay (FDA approved for NASAL specimens in patients 33 years of age and older), is one component of a comprehensive surveillance program. It is not intended to diagnose infection nor to guide or monitor treatment. Performed at [facility], [address] [place], [place] [address]   Aerobic/Anaerobic Culture (surgical/deep wound)     Status: None   Collection Time: 10/20/20  5:07 PM   Specimen: PATH Soft tissue  Result Value Ref Range Status   Specimen Description PERINEAL ABSCESS  Final   Special Requests DRAINAGE  Final   Gram Stain   Final    MODERATE WBC PRESENT,BOTH PMN AND MONONUCLEAR FEW GRAM POSITIVE COCCI IN PAIRS IN CHAINS MODERATE GRAM NEGATIVE RODS RARE GRAM POSITIVE RODS Performed at [facility], [address] [place], [place]  [address]    Culture   Final    MODERATE BACTEROIDES ORALIS BETA LACTAMASE POSITIVE MODERATE FUSOBACTERIUM SPECIES    Report Status 10/25/2020 FINAL  Final  Aerobic Culture (superficial specimen)     Status: None   Collection Time: 10/20/20  5:07 PM   Specimen: Wound  Result Value Ref Range Status   Specimen Description WOUND PERINEAL ABSCESS   Final   Special Requests SWAB  Final   Gram Stain NO WBC SEEN NO ORGANISMS SEEN   Final   Culture   Final    FEW NORMAL SKIN FLORA Performed at [facility], [address], [place], [place] [address]    Report Status 10/23/2020 FINAL  Final  Culture, blood (routine x 2)     Status: None   Collection Time: 10/21/20  1:35 PM   Specimen: BLOOD RIGHT HAND  Result Value Ref Range Status   Specimen Description   Final    BLOOD RIGHT HAND Performed at [facility], [address] [place], [place] [address]    Special Requests   Final    BOTTLES DRAWN AEROBIC ONLY Blood Culture adequate volume Performed at [facility], [address] [place], [place] [address]    Culture   Final    NO GROWTH 5 DAYS Performed at [facility], [address] [place], [place] [address]    Report Status 10/26/2020 FINAL  Final  Culture, blood (routine x 2)     Status: None   Collection Time: 10/21/20  1:35 PM   Specimen: BLOOD  Result Value Ref Range Status   Specimen Description   Final    BLOOD RIGHT ANTECUBITAL Performed at [facility], [address] [place], [place] [address]    Special Requests   Final    BOTTLES DRAWN AEROBIC AND ANAEROBIC Blood Culture adequate volume Performed at [facility], [address] [place], [place] [address]    Culture   Final    NO GROWTH 5 DAYS Performed at [facility], [address], [place], [place] [address]    Report Status 10/26/2020 FINAL  Final      Radiology Studies: CT ABDOMEN PELVIS W CONTRAST  Result Date: 10/27/2020 CLINICAL DATA:  Post I and D of perineal/rectal abscess. Perineal pain. EXAM: CT ABDOMEN AND PELVIS WITH CONTRAST TECHNIQUE: Multidetector CT imaging of the abdomen and pelvis was performed using the standard protocol following bolus administration of intravenous contrast. CONTRAST:  OMNIPAQUE IOHEXOL 300 MG/ML  SOLN  COMPARISON:  10/20/2020 FINDINGS: Lower chest: Bibasilar subsegmental atelectasis. Normal heart size without pericardial or pleural effusion. Hepatobiliary: Normal liver. Normal gallbladder, without biliary ductal dilatation. Pancreas: Normal, without mass or ductal dilatation. Spleen: Normal in size, without focal abnormality. Adrenals/Urinary Tract: Normal adrenal glands. Normal kidneys, without hydronephrosis. Normal urinary bladder. Stomach/Bowel: Normal stomach, without wall thickening. Normal colon and terminal ileum. Normal small bowel. Vascular/Lymphatic: Normal caliber of the aorta and branch vessels. No abdominal adenopathy. Right inguinal node of 11 mm on 93/2 is similar to the 12 mm on the prior, favored to be reactive. Reproductive: Significantly improved appearance of the prostate. A drain is identified about the apex of the prostate. Trace fluid about the pelvic floor, just caudal to the apex of the prostate. Example 11 mm on 97/2. At the site of a 4.4 x 3.9 cm collection on the prior exam. Equivocal hypoattenuation just cephalad to this, in the region of the  prostatic apex including on 94/2 is likely postoperative. Other: Soft tissue fullness within the ischioanal fossa. A left perianal collection is favored to represent phlegmon, measuring greater than fluid density centrally. Example 4.2 x 2.6 cm on 103/2. This is posterior and caudal to a smaller abscess on the prior exam. Right sided perianal fluid collection has resolved or been drained. Musculoskeletal: No acute osseous abnormality. IMPRESSION: 1. Since 10/20/2020, surgical debridement of periprosthetic and perianal abscesses. Trace residual periprostatic fluid with areas of subtle hypoattenuation which are likely postoperative. 2. Primarily resolved/drained perianal abscesses. A new or increased left perianal low-density collection is favored to represent phlegmon. Electronically Signed   By: Jeronimo Greaves M.D.   On: 10/27/2020 08:59       Scheduled Meds: . HYDROmorphone      . [MAR Hold] nicotine  21 mg Transdermal Daily  . [MAR Hold] polyethylene glycol  17 g Oral BID  . [MAR Hold] senna-docusate  2 tablet Oral BID   Continuous Infusions: . [MAR Hold] sodium chloride 10 mL/hr at 10/26/20 1400  . [MAR Hold] ampicillin-sulbactam (UNASYN) IV 3 g (10/28/20 0820)  . [MAR Hold] lactated ringers 1,000 mL (10/28/20 1040)     LOS: 8 days      Time spent: 25 minutes   Noralee Stain, DO [facility] 10/28/2020, 11:31 AM   Available via Epic secure chat 7am-7pm After these hours, please refer to coverage provider listed on [email or web address]

## 2020-10-28 NOTE — Progress Notes (Signed)
Patient ID: Tyler [name], male   DOB: 11-02-1985, 35 y.o.   MRN: [id number]   Pre Procedure note for inpatients:   Tyler [name] has been scheduled for Procedure(s): IRRIGATION AND DEBRIDEMENT PERINEAL ABCESS (N/A) today. The various methods of treatment have been discussed with the patient. After consideration of the risks, benefits and treatment options the patient has consented to the planned procedure.   The patient has been seen and labs reviewed. There are no changes in the patient's condition to prevent proceeding with the planned procedure today.  Recent labs:  Lab Results  Component Value Date   WBC 5.5 10/28/2020   HGB 13.2 10/28/2020   HCT 41.2 10/28/2020   PLT 545 (H) 10/28/2020   GLUCOSE 92 10/28/2020   ALT 13 10/20/2020   AST 15 10/20/2020   NA 137 10/28/2020   K 4.2 10/28/2020   CL 100 10/28/2020   CREATININE 0.89 10/28/2020   BUN 13 10/28/2020   CO2 26 10/28/2020   INR 1.3 (H) 10/21/2020    Abigail Miyamoto, MD 10/28/2020 10:44 AM

## 2020-10-28 NOTE — Anesthesia Procedure Notes (Signed)
Procedure Name: LMA Insertion Date/Time: 10/28/2020 12:05 PM Performed by: Nelle Don, CRNA Pre-anesthesia Checklist: Patient identified, Emergency Drugs available, Suction available and Patient being monitored Patient Re-evaluated:Patient Re-evaluated prior to induction Oxygen Delivery Method: Circle system utilized Preoxygenation: Pre-oxygenation with 100% oxygen Induction Type: IV induction LMA: LMA with gastric port inserted LMA Size: 4.0 Number of attempts: 1 Dental Injury: Teeth and Oropharynx as per pre-operative assessment

## 2020-10-28 NOTE — Anesthesia Postprocedure Evaluation (Signed)
Anesthesia Post Note  Patient: Tyler [name]  Procedure(s) Performed: IRRIGATION AND DEBRIDEMENT PERINEAL ABCESS (N/A Perineum)     Patient location during evaluation: PACU Anesthesia Type: General Level of consciousness: awake and alert Pain management: pain level controlled Vital Signs Assessment: post-procedure vital signs reviewed and stable Respiratory status: spontaneous breathing, nonlabored ventilation, respiratory function stable and patient connected to nasal cannula oxygen Cardiovascular status: blood pressure returned to baseline and stable Postop Assessment: no apparent nausea or vomiting Anesthetic complications: no   No complications documented.  Last Vitals:  Vitals:   10/28/20 1445 10/28/20 1459  BP: 115/73 128/79  Pulse: 80 75  Resp:  16  Temp: 36.8 C 36.8 C  SpO2: 96% 100%    Last Pain:  Vitals:   10/28/20 1459  TempSrc: Oral  PainSc:                  Trevor Iha

## 2020-10-28 NOTE — Op Note (Signed)
   Tyler [name] 10/28/2020   Pre-op Diagnosis: perineal abcess     Post-op Diagnosis: same  Procedure(s): INCISION AND DRAINAGE OF  PERIANAL ABCESS  Surgeon(s): Abigail Miyamoto, MD  Anesthesia: General  Staff:  Circulator: Selena Lesser, RN Relief Circulator: Brown Human, RN Scrub Person: Alric Seton  Estimated Blood Loss: Minimal  Indications: This is a 35 year old gentleman who is status post a recent drainage of a prostate abscess by urology.  Because of ongoing abscess and phlegmon in his perianal area decision was made to proceed to the operating room for incision and drainage of a perianal abscess.  Procedure: The patient was brought to the operating identified as the correct patient.  He was placed upon the operating table and general anesthesia was induced.  He was then placed in lithotomy position.  His perianal area was then prepped and draped in usual sterile fashion.  His Penrose drain in the perineum was still present from the urology procedure.  I anesthetized the left perianal area with a mixture of Marcaine and Exparel.  I then made an incision in the area of the abscess with a scalpel.  I then performed ellipse of skin.  I found a necrotic abscess cavity with thin fluid.  I evacuated the necrotic tissue and fluid.  There was slight communication with the open wound where the Penrose was placed.  There was no other deep extension of the area.  I inserted a retractor into the anal canal and evaluated circumferentially and saw no evidence of tumor or fistula.  I achieved hemostasis with the cautery.  I anesthetized the wound further with Marcaine.  I then packed it with a dry 2 inch Kling dressing.  Dry gauze and ABDs were placed over this.  The patient tolerated the procedure well.  All the counts were correct at the end of the procedure.  The patient was then extubated in the operating room and taken in stable condition to the recovery room.           Abigail Miyamoto   Date: 10/28/2020  Time: 12:23 PM

## 2020-10-28 NOTE — Transfer of Care (Signed)
Immediate Anesthesia Transfer of Care Note  Patient: Tyler [name]  Procedure(s) Performed: IRRIGATION AND DEBRIDEMENT PERINEAL ABCESS (N/A Perineum)  Patient Location: PACU  Anesthesia Type:General  Level of Consciousness: awake, alert  and oriented  Airway & Oxygen Therapy: Patient Spontanous Breathing and Patient connected to face mask  Post-op Assessment: Report given to RN and Post -op Vital signs reviewed and stable  Post vital signs: Reviewed and stable  Last Vitals:  Vitals Value Taken Time  BP    Temp    Pulse    Resp    SpO2      Last Pain:  Vitals:   10/28/20 1100  TempSrc:   PainSc: 10-Worst pain ever      Patients Stated Pain Goal: 3 (10/28/20 1100)  Complications: No complications documented.

## 2020-10-29 ENCOUNTER — Encounter (HOSPITAL_COMMUNITY): Payer: Self-pay | Admitting: Surgery

## 2020-10-29 LAB — BASIC METABOLIC PANEL
Anion gap: 10 (ref 5–15)
BUN: 14 mg/dL (ref 6–20)
CO2: 26 mmol/L (ref 22–32)
Calcium: 9 mg/dL (ref 8.9–10.3)
Chloride: 100 mmol/L (ref 98–111)
Creatinine, Ser: 0.98 mg/dL (ref 0.61–1.24)
GFR, Estimated: >60 mL/min (ref >60)
Glucose, Bld: 99 mg/dL (ref 70–99)
Potassium: 3.9 mmol/L (ref 3.5–5.1)
Sodium: 136 mmol/L (ref 135–145)

## 2020-10-29 LAB — CBC
HCT: 38.8 % — ABNORMAL LOW (ref 39.0–52.0)
Hemoglobin: 12.3 g/dL — ABNORMAL LOW (ref 13.0–17.0)
MCH: 26.7 pg (ref 26.0–34.0)
MCHC: 31.7 g/dL (ref 30.0–36.0)
MCV: 84.2 fL (ref 80.0–100.0)
Platelets: 555 10*3/uL — ABNORMAL HIGH (ref 150–400)
RBC: 4.61 MIL/uL (ref 4.22–5.81)
RDW: 15 % (ref 11.5–15.5)
WBC: 8.9 10*3/uL (ref 4.0–10.5)
nRBC: 0 % (ref 0.0–0.2)

## 2020-10-29 MED ORDER — HYDROMORPHONE HCL 1 MG/ML IJ SOLN
1.0000 mg | INTRAMUSCULAR | Status: DC | PRN
  Administered 2020-10-29 – 2020-10-30 (×12): 1 mg via INTRAVENOUS
  Filled 2020-10-29 (×12): qty 1

## 2020-10-29 MED ORDER — LORAZEPAM 2 MG/ML IJ SOLN
1.0000 mg | Freq: Once | INTRAMUSCULAR | Status: AC
  Administered 2020-10-29: 1 mg via INTRAVENOUS
  Filled 2020-10-29: qty 1

## 2020-10-29 MED ORDER — OXYCODONE HCL 5 MG PO TABS
5.0000 mg | ORAL_TABLET | Freq: Four times a day (QID) | ORAL | Status: DC
Start: 2020-10-29 — End: 2020-10-30
  Administered 2020-10-29 – 2020-10-30 (×3): 5 mg via ORAL
  Filled 2020-10-29 (×4): qty 1

## 2020-10-29 NOTE — Discharge Instructions (Signed)
Anorectal Abscess An abscess is an infected area that contains a collection of pus. An anorectal abscess is an abscess that is near the opening of the anus or around the rectum. Without treatment, an anorectal abscess can become larger and cause other problems, such as a more serious body-wide infection or pain, especially during bowel movements. What are the causes? This condition is caused by plugged glands or an infection in one of these areas:  The anus.  The area between the anus and the scrotum in males or between the anus and the vagina in females (perineum). What increases the risk? The following factors may make you more likely to develop this condition:  Diabetes or inflammatory bowel disease.  Having a body defense system (immune system) that is weak.  Engaging in anal sex.  Having a sexually transmitted infection (STI).  Certain kinds of cancer, such as rectal carcinoma, leukemia, or lymphoma. What are the signs or symptoms? The main symptom of this condition is pain. The pain may be a throbbing pain that gets worse during bowel movements. Other symptoms include:  Swelling and redness in the area of the abscess. The redness may go beyond the abscess and appear as a red streak on the skin.  A visible, painful lump, or a lump that can be felt when touched.  Bleeding or pus-like discharge from the area.  Fever.  General weakness.  Constipation.  Diarrhea. How is this diagnosed? This condition is diagnosed based on your medical history and a physical exam of the affected area.  This may involve examining the rectal area with a gloved hand (digital rectal exam).  Sometimes, the health care provider needs to look into the rectum using a probe, scope, or imaging test.  For women, it may require a careful vaginal exam. How is this treated? Treatment for this condition may include:  Incision and drainage surgery. This involves making an incision over the abscess to  drain the pus.  Medicines, including antibiotic medicine, pain medicine, stool softeners, or laxatives. Follow these instructions at home: Medicines  Take over-the-counter and prescription medicines only as told by your health care provider.  If you were prescribed an antibiotic medicine, use it as told by your health care provider. Do not stop using the antibiotic even if you start to feel better.  Do not drive or use heavy machinery while taking prescription pain medicine. Wound care  If gauze was used in the abscess, follow instructions from your health care provider about removing or changing the gauze. It can usually be removed in 2-3 days.  Wash your hands with soap and water before you remove or change your gauze. If soap and water are not available, use hand sanitizer.  If one or more drains were placed in the abscess cavity, be careful not to pull at them. Your health care provider will tell you how long they need to remain in place.  Check your incision area every day for signs of infection. Check for: ? More redness, swelling, or pain. ? More fluid or blood. ? Warmth. ? Pus or a bad smell.   Managing pain, stiffness, and swelling  Take a sitz bath 3-4 times a day and after bowel movements. This will help reduce pain and swelling.  To relieve pain, try sitting: ? On a heating pad with the setting on low. ? On an inflatable donut-shaped cushion.  If directed, put ice on the affected area: ? Put ice in a plastic bag. ? Place  a towel between your skin and the bag. ? Leave the ice on for 20 minutes, 2-3 times a day.   General instructions  Follow any diet instructions given by your health care provider.  Keep all follow-up visits as told by your health care provider. This is important. Contact a health care provider if you have:  Bleeding from your incision.  Pain, swelling, or redness that does not improve or gets worse.  Trouble passing stool or  urine.  Symptoms that return after treatment. Get help right away if you:  Have problems moving or using your legs.  Have severe or increasing pain.  Have swelling in the affected area that suddenly gets worse.  Have a large increase in bleeding or passing of pus.  Develop chills or a fever. Summary  An anorectal abscess is an abscess that is near the opening of the anus or around the rectum. An abscess is an infected area that contains a collection of pus.  The main symptom of this condition is pain. It may be a throbbing pain that gets worse during bowel movements.  Treatment for an anorectal abscess may include surgery to drain the pus from the abscess. Medicines and sitz baths may also be a part of your treatment plan. This information is not intended to replace advice given to you by your health care provider. Make sure you discuss any questions you have with your health care provider. Document Revised: 11/09/2017 Document Reviewed: 11/09/2017 [employer] Patient Education  2021 [employer]   How to Take a Sitz Bath A sitz bath is a warm water bath that may be used to care for your rectum, genital area, or the area between your rectum and genitals (perineum). In a sitz bath, the water only comes up to your hips and covers your buttocks. A sitz bath may be done in a bathtub or with a portable sitz bath that fits over the toilet. Your health care provider may recommend a sitz bath to help:  Relieve pain and discomfort after delivering a baby.  Relieve pain and itching from hemorrhoids or anal fissures.  Relieve pain after certain surgeries.  Relax muscles that are sore or tight. How to take a sitz bath Take 3-4 sitz baths a day, or as many as told by your health care provider. Bathtub sitz bath To take a sitz bath in a bathtub: 1. Partially fill a bathtub with warm water. The water should be deep enough to cover your hips and buttocks when you are sitting in the  tub. 2. Follow your health care provider's instructions if you are told to put medicine in the water. 3. Sit in the water. Open the tub drain a little, and leave it open during your bath. 4. Turn on the warm water again, enough to replace the water that is draining out. Keep the water running throughout your bath. This helps keep the water at the right level and temperature. 5. Soak in the water for 15-20 minutes, or as long as told by your health care provider. 6. When you are done, be careful when you stand up. You may feel dizzy. 7. After the sitz bath, pat yourself dry. Do not rub your skin to dry it.   Over-the-toilet sitz bath To take a sitz bath with an over-the-toilet basin: 1. Follow the manufacturer's instructions. 2. Fill the basin with warm water. 3. Follow your health care provider's instructions if you were told to put medicine in the water. 4. Sit   on the seat. Make sure the water covers your buttocks and perineum. 5. Soak in the water for 15-20 minutes, or as long as told by your health care provider. 6. After the sitz bath, pat yourself dry. Do not rub your skin to dry it. 7. Clean and dry the basin between uses. 8. Discard the basin if it cracks, or according to the manufacturer's instructions.   Contact a health care provider if:  Your pain or itching gets worse. Do not continue with sitz baths if your symptoms get worse.  You have new symptoms. Do not continue with sitz baths until you talk with your health care provider. Summary  A sitz bath is a warm water bath in which the water only comes up to your hips and covers your buttocks.  A sitz bath may help relieve pain and discomfort after delivering a baby. It also may help with pain and itching from hemorrhoids or anal fissures, or pain after certain surgeries. It can also help to relax muscles that are sore or tight.  Take 3-4 sitz baths a day, or as many as told by your health care provider. Soak in the water for  15-20 minutes.  Do not continue with sitz baths if your symptoms get worse. This information is not intended to replace advice given to you by your health care provider. Make sure you discuss any questions you have with your health care provider. Document Revised: 06/18/2020 Document Reviewed: 06/18/2020 [employer] Patient Education  2021 [employer].

## 2020-10-29 NOTE — Progress Notes (Signed)
PROGRESS NOTE    Tyler [name]  BHA:[id number] DOB: [birth year] DOA: 10/20/2020 PCP: Patient, No Pcp Per     Brief Narrative:  Tyler [name] is a 35 y.o. year old male with medical history significant for recurrent prostate abscess (he has had 2 prior admissions: 10/4-10/8 with unsuccessful transurethral moving followed by transrectal drain placement with wound cultures growing strep angina gnosis, followed by hospitalization 10/19-10/25 for sepsis due to perineal/right scrotal abscess requiring I&D).  During last hospitalization patient underwent cystourethroscopy and I&D of abscess on 10/19, completed a week of empiric IV vancomycin, cefepime and Flagyl, blood cultures remain unremarkable at that time and abscess culture showed mixed anaerobic flora and was discharged 2 to 3 weeks of oral Augmentin.  Patient presented on 1/4 with recurrent episode of peroneal discomfort starting since Christmas, pain radiating to right testicle with associated nausea, chills and night sweats and fever of 103 at home.  He was admitted for sepsis secondary to recurrent multiloculated perirectal abscesses, given his history of polymicrobial abscesses he was started on empiric vancomycin and Zosyn and underwent operative I&D of perineal abscess by urology as well as transurethral resection of the prostate abscess on 1/4.  Repeat CT on 1/11 showed new left perianal phlegmon.  General surgery was consulted.  New events last 24 hours / Subjective: Continues to require IV Dilaudid for pain control  Assessment & Plan:   Principal Problem:   Sepsis (HCC) Active Problems:   Perineal abscess   Tobacco abuse   Sepsis secondary to recurrent peri-prostatic and perineal abscess status post TURP and I&D of perineal abscess on 1/4, I&D of perianal abscess on 1/12 -Intraoperative wound culture positive for Fusobacterium, Bacteroides -Appreciate urology, general surgery -On Unasyn -Pain control  Tobacco use -Nicotine  patch, cessation counseling   DVT prophylaxis: SCDs Place and maintain sequential compression device Start: 10/28/20 1142  Code Status: Full code Family Communication: No family at bedside Disposition Plan:  Status is: Inpatient  Remains inpatient appropriate because:Ongoing active pain requiring inpatient pain management and Inpatient level of care appropriate due to severity of illness   Dispo: The patient is from: Home              Anticipated d/c is to: Home              Anticipated d/c date is: 2 days              Patient currently is not medically stable to d/c.  Remains on IV antibiotics and IV pain medication.  We will need to work on weaning down IV pain medication once discharge planning in place    Antimicrobials:  Anti-infectives (From admission, onward)   Start     Dose/Rate Route Frequency Ordered Stop   10/26/20 1400  Ampicillin-Sulbactam (UNASYN) 3 g in sodium chloride 0.9 % 100 mL IVPB        3 g 200 mL/hr over 30 Minutes Intravenous Every 6 hours 10/26/20 0840     10/25/20 2200  vancomycin (VANCOREADY) IVPB 1250 mg/250 mL  Status:  Discontinued        1,250 mg 166.7 mL/hr over 90 Minutes Intravenous Every 12 hours 10/25/20 1225 10/26/20 0840   10/20/20 2200  vancomycin (VANCOREADY) IVPB 1500 mg/300 mL  Status:  Discontinued        1,500 mg 150 mL/hr over 120 Minutes Intravenous Every 12 hours 10/20/20 1017 10/25/20 1225   10/20/20 1800  vancomycin (VANCOREADY) IVPB 1500 mg/300 mL  Status:  Discontinued  1,500 mg 150 mL/hr over 120 Minutes Intravenous Every 12 hours 10/20/20 1015 10/20/20 1017   10/20/20 1400  piperacillin-tazobactam (ZOSYN) IVPB 3.375 g  Status:  Discontinued        3.375 g 12.5 mL/hr over 240 Minutes Intravenous Every 8 hours 10/20/20 1015 10/26/20 0840   10/20/20 1200  vancomycin (VANCOREADY) IVPB 500 mg/100 mL        500 mg 100 mL/hr over 60 Minutes Intravenous  Once 10/20/20 1017 10/20/20 1620   10/20/20 0615  ceFEPIme (MAXIPIME)  2 g in sodium chloride 0.9 % 100 mL IVPB        2 g 200 mL/hr over 30 Minutes Intravenous STAT 10/20/20 0609 10/20/20 0936   10/20/20 0615  vancomycin (VANCOREADY) IVPB 1500 mg/300 mL        1,500 mg 150 mL/hr over 120 Minutes Intravenous STAT 10/20/20 0610 10/20/20 0815   10/20/20 0600  metroNIDAZOLE (FLAGYL) IVPB 500 mg        500 mg 100 mL/hr over 60 Minutes Intravenous  Once 10/20/20 0551 10/20/20 1035       Objective: Vitals:   10/29/20 0159 10/29/20 0452 10/29/20 0530 10/29/20 1150  BP: 117/74 133/82  138/74  Pulse: 87 72  88  Resp: 16 16  19   Temp: 98.8 F (37.1 C) 97.8 F (36.6 C)  98.6 F (37 C)  TempSrc: Oral Oral  Oral  SpO2: 96% 100%  98%  Weight:   84.2 kg   Height:        Intake/Output Summary (Last 24 hours) at 10/29/2020 1215 Last data filed at 10/29/2020 0530 Gross per 24 hour  Intake 282.67 ml  Output 510 ml  Net -227.33 ml   Filed Weights   10/26/20 0500 10/27/20 0516 10/29/20 0530  Weight: 84.4 kg 82.6 kg 84.2 kg    Examination: General exam: Appears calm and comfortable  Respiratory system: Clear to auscultation. Respiratory effort normal. Cardiovascular system: S1 & S2 heard, RRR. No pedal edema. Gastrointestinal system: Abdomen is nondistended, soft and nontender. Normal bowel sounds heard. Central nervous system: Alert and oriented. Non focal exam. Speech clear  Extremities: Symmetric in appearance bilaterally  Psychiatry: Judgement and insight appear stable. Mood & affect appropriate.    Data Reviewed: I have personally reviewed following labs and imaging studies  CBC: Recent Labs  Lab 10/25/20 0629 10/26/20 0958 10/27/20 0545 10/28/20 0557 10/29/20 0605  WBC 7.2 7.4 6.6 5.5 8.9  NEUTROABS  --   --   --  2.8  --   HGB 14.9 13.3 14.0 13.2 12.3*  HCT 45.0 41.0 42.3 41.2 38.8*  MCV 80.6 81.7 80.7 82.6 84.2  PLT 372 529* 558* 545* 555*   Basic Metabolic Panel: Recent Labs  Lab 10/24/20 0620 10/25/20 0629 10/26/20 0539  10/27/20 0545 10/28/20 0557 10/29/20 0605  NA 135 135  --  136 137 136  K 4.0 4.6  --  4.2 4.2 3.9  CL 101 100  --  98 100 100  CO2 25 23  --  28 26 26   GLUCOSE 112* 105*  --  92 92 99  BUN 11 14  --  15 13 14   CREATININE 0.96 0.95 0.95 1.01 0.89 0.98  CALCIUM 8.9 9.2  --  9.4 9.2 9.0   GFR: Estimated Creatinine Clearance: 118.8 mL/min (by C-G formula based on SCr of 0.98 mg/dL). Liver Function Tests: No results for input(s): AST, ALT, ALKPHOS, BILITOT, PROT, ALBUMIN in the last 168 hours. No results for input(s):  LIPASE, AMYLASE in the last 168 hours. No results for input(s): AMMONIA in the last 168 hours. Coagulation Profile: No results for input(s): INR, PROTIME in the last 168 hours. Cardiac Enzymes: No results for input(s): CKTOTAL, CKMB, CKMBINDEX, TROPONINI in the last 168 hours. BNP (last 3 results) No results for input(s): PROBNP in the last 8760 hours. HbA1C: No results for input(s): HGBA1C in the last 72 hours. CBG: Recent Labs  Lab 10/23/20 2205  GLUCAP 106*   Lipid Profile: No results for input(s): CHOL, HDL, LDLCALC, TRIG, CHOLHDL, LDLDIRECT in the last 72 hours. Thyroid Function Tests: No results for input(s): TSH, T4TOTAL, FREET4, T3FREE, THYROIDAB in the last 72 hours. Anemia Panel: No results for input(s): VITAMINB12, FOLATE, FERRITIN, TIBC, IRON, RETICCTPCT in the last 72 hours. Sepsis Labs: No results for input(s): PROCALCITON, LATICACIDVEN in the last 168 hours.  Recent Results (from the past 240 hour(s))  Urine culture     Status: None   Collection Time: 10/20/20  6:00 AM   Specimen: Urine, Clean Catch  Result Value Ref Range Status   Specimen Description   Final    URINE, CLEAN CATCH Performed at [facility], [address] [place], [place] [address]    Special Requests   Final    NONE Performed at [facility], [address] [place], [place] [address]    Culture   Final    NO GROWTH Performed at  [facility], [address] [place], [place] [address]    Report Status 10/21/2020 FINAL  Final  Resp Panel by RT-PCR (Flu A&B, Covid) Nasopharyngeal Swab     Status: None   Collection Time: 10/20/20  6:18 AM   Specimen: Nasopharyngeal Swab; Nasopharyngeal(NP) swabs in vial transport medium  Result Value Ref Range Status   SARS Coronavirus 2 by RT PCR NEGATIVE NEGATIVE Final    Comment: (NOTE) SARS-CoV-2 target nucleic acids are NOT DETECTED.  The SARS-CoV-2 RNA is generally detectable in upper respiratory specimens during the acute phase of infection. The lowest concentration of SARS-CoV-2 viral copies this assay can detect is 138 copies/mL. A negative result does not preclude SARS-Cov-2 infection and should not be used as the sole basis for treatment or other patient management decisions. A negative result may occur with  improper specimen collection/handling, submission of specimen other than nasopharyngeal swab, presence of viral mutation(s) within the areas targeted by this assay, and inadequate number of viral copies(<138 copies/mL). A negative result must be combined with clinical observations, patient history, and epidemiological information. The expected result is Negative.  Fact Sheet for Patients:  BloggerCourse.comhttps://www.fda.gov/media/152166/download  Fact Sheet for Healthcare Providers:  SeriousBroker.ithttps://www.fda.gov/media/152162/download  This test is no t yet approved or cleared by the [place] FDA and  has been authorized for detection and/or diagnosis of SARS-CoV-2 by FDA under an Emergency Use Authorization (EUA). This EUA will remain  in effect (meaning this test can be used) for the duration of the COVID-19 declaration under Section 564(b)(1) of the Act, 21 U.S.C.section 360bbb-3(b)(1), unless the authorization is terminated  or revoked sooner.       Influenza A by PCR NEGATIVE NEGATIVE Final   Influenza B by PCR NEGATIVE NEGATIVE Final    Comment: (NOTE) The  Xpert Xpress SARS-CoV-2/FLU/RSV plus assay is intended as an aid in the diagnosis of influenza from Nasopharyngeal swab specimens and should not be used as a sole basis for treatment. Nasal washings and aspirates are unacceptable for Xpert Xpress SARS-CoV-2/FLU/RSV testing.  Fact Sheet for Patients: BloggerCourse.com  Fact Sheet for Healthcare Providers: SeriousBroker.it  This test is not yet approved or cleared by the [place] FDA and has been authorized for detection and/or diagnosis of SARS-CoV-2 by FDA under an Emergency Use Authorization (EUA). This EUA will remain in effect (meaning this test can be used) for the duration of the COVID-19 declaration under Section 564(b)(1) of the Act, 21 U.S.C. section 360bbb-3(b)(1), unless the authorization is terminated or revoked.  Performed at [facility], [address] [place], [place] [address]   Blood culture (routine x 2)     Status: None   Collection Time: 10/20/20  6:20 AM   Specimen: BLOOD  Result Value Ref Range Status   Specimen Description   Final    BLOOD LEFT ANTECUBITAL Performed at [facility], [address] [place], [place] [address]    Special Requests   Final    BOTTLES DRAWN AEROBIC ONLY Blood Culture results may not be optimal due to an inadequate volume of blood received in culture bottles Performed at [facility], [address] [place], [place] [address]    Culture   Final    NO GROWTH 5 DAYS Performed at [facility], [address], [place], [place] [address]    Report Status 10/25/2020 FINAL  Final  Blood culture (routine x 2)     Status: None   Collection Time: 10/20/20  8:55 AM   Specimen: BLOOD  Result Value Ref Range Status   Specimen Description   Final    BLOOD RIGHT WRIST Performed at [facility], [address], [place], [place] [address]    Special  Requests   Final    BOTTLES DRAWN AEROBIC AND ANAEROBIC Blood Culture results may not be optimal due to an excessive volume of blood received in culture bottles Performed at [facility], [address], [place], [place] [address]    Culture   Final    NO GROWTH 5 DAYS Performed at [facility], [address] [place], [place] [address]    Report Status 10/25/2020 FINAL  Final  Surgical pcr screen     Status: None   Collection Time: 10/20/20  1:24 PM   Specimen: Nasal Mucosa; Nasal Swab  Result Value Ref Range Status   MRSA, PCR NEGATIVE NEGATIVE Final   Staphylococcus aureus NEGATIVE NEGATIVE Final    Comment: (NOTE) The Xpert SA Assay (FDA approved for NASAL specimens in patients 43 years of age and older), is one component of a comprehensive surveillance program. It is not intended to diagnose infection nor to guide or monitor treatment. Performed at [facility], [address], [place], [place] [address]   Aerobic/Anaerobic Culture (surgical/deep wound)     Status: None   Collection Time: 10/20/20  5:07 PM   Specimen: PATH Soft tissue  Result Value Ref Range Status   Specimen Description PERINEAL ABSCESS  Final   Special Requests DRAINAGE  Final   Gram Stain   Final    MODERATE WBC PRESENT,BOTH PMN AND MONONUCLEAR FEW GRAM POSITIVE COCCI IN PAIRS IN CHAINS MODERATE GRAM NEGATIVE RODS RARE GRAM POSITIVE RODS Performed at [facility], [address] [place], [place] [address]    Culture   Final    MODERATE BACTEROIDES ORALIS BETA LACTAMASE POSITIVE MODERATE FUSOBACTERIUM SPECIES    Report Status 10/25/2020 FINAL  Final  Aerobic Culture (superficial specimen)  Status: None   Collection Time: 10/20/20  5:07 PM   Specimen: Wound  Result Value Ref Range Status   Specimen Description WOUND PERINEAL ABSCESS  Final   Special Requests SWAB  Final   Gram Stain NO WBC SEEN NO ORGANISMS SEEN   Final   Culture   Final     FEW NORMAL SKIN FLORA Performed at [facility], [address] [place], [place] [address]    Report Status 10/23/2020 FINAL  Final  Culture, blood (routine x 2)     Status: None   Collection Time: 10/21/20  1:35 PM   Specimen: BLOOD RIGHT HAND  Result Value Ref Range Status   Specimen Description   Final    BLOOD RIGHT HAND Performed at [facility], [address] [place], [place] [address]    Special Requests   Final    BOTTLES DRAWN AEROBIC ONLY Blood Culture adequate volume Performed at [facility], [address] [place], [place] [address]    Culture   Final    NO GROWTH 5 DAYS Performed at [facility], [address] [place], [place] [address]    Report Status 10/26/2020 FINAL  Final  Culture, blood (routine x 2)     Status: None   Collection Time: 10/21/20  1:35 PM   Specimen: BLOOD  Result Value Ref Range Status   Specimen Description   Final    BLOOD RIGHT ANTECUBITAL Performed at [facility], [address] [place], [place] [address]    Special Requests   Final    BOTTLES DRAWN AEROBIC AND ANAEROBIC Blood Culture adequate volume Performed at [facility], [address] [place], [place] [address]    Culture   Final    NO GROWTH 5 DAYS Performed at [facility], [address] [place], [place] [address]    Report Status 10/26/2020 FINAL  Final      Radiology Studies: No results found.    Scheduled Meds: . nicotine  21 mg Transdermal Daily  . oxyCODONE  5 mg Oral Q6H  . polyethylene glycol  17 g Oral BID  . senna-docusate  2 tablet Oral BID   Continuous Infusions: . sodium chloride 10 mL/hr at 10/26/20 1400  . ampicillin-sulbactam (UNASYN) IV 3 g (10/29/20 0530)     LOS: 9 days      Time spent: 25 minutes   Noralee StainJennifer Melita Villalona, DO [facility] 10/29/2020, 12:15 PM   Available via Epic secure chat 7am-7pm After these hours, please  refer to coverage provider listed on [email or web address]

## 2020-10-29 NOTE — Progress Notes (Signed)
1 Day Post-Op  Subjective: CC: Notes pain over area of I&D. Tolerating diet without abdominal pain, n/v. Voiding.   Objective: Vital signs in last 24 hours: Temp:  [97.6 F (36.4 C)-99 F (37.2 C)] 97.8 F (36.6 C) (01/13 0452) Pulse Rate:  [64-102] 72 (01/13 0452) Resp:  [11-20] 16 (01/13 0452) BP: (101-144)/(60-85) 133/82 (01/13 0452) SpO2:  [94 %-100 %] 100 % (01/13 0452) Weight:  [84.2 kg] 84.2 kg (01/13 0530) Last BM Date: 10/27/20  Intake/Output from previous day: 01/12 0701 - 01/13 0700 In: 282.7 [I.V.:82.7; IV Piggyback:200] Out: 760 [Urine:750; Blood:10] Intake/Output this shift: No intake/output data recorded.  PE: Wound: Left perianal abscess packing removed. No cellulitis of periwound. No drainage. Counter wound with penrose in place.   Lab Results:  Recent Labs    10/28/20 0557 10/29/20 0605  WBC 5.5 8.9  HGB 13.2 12.3*  HCT 41.2 38.8*  PLT 545* 555*   BMET Recent Labs    10/28/20 0557 10/29/20 0605  NA 137 136  K 4.2 3.9  CL 100 100  CO2 26 26  GLUCOSE 92 99  BUN 13 14  CREATININE 0.89 0.98  CALCIUM 9.2 9.0   PT/INR No results for input(s): LABPROT, INR in the last 72 hours. CMP     Component Value Date/Time   NA 136 10/29/2020 0605   K 3.9 10/29/2020 0605   CL 100 10/29/2020 0605   CO2 26 10/29/2020 0605   GLUCOSE 99 10/29/2020 0605   BUN 14 10/29/2020 0605   CREATININE 0.98 10/29/2020 0605   CALCIUM 9.0 10/29/2020 0605   PROT 8.3 (H) 10/20/2020 0420   ALBUMIN 3.7 10/20/2020 0420   AST 15 10/20/2020 0420   ALT 13 10/20/2020 0420   ALKPHOS 52 10/20/2020 0420   BILITOT 0.6 10/20/2020 0420   GFRNONAA >60 10/29/2020 0605   GFRAA >60 07/21/2020 0508   Lipase  No results found for: LIPASE     Studies/Results: No results found.  Anti-infectives: Anti-infectives (From admission, onward)   Start     Dose/Rate Route Frequency Ordered Stop   10/26/20 1400  Ampicillin-Sulbactam (UNASYN) 3 g in sodium chloride 0.9 % 100 mL  IVPB        3 g 200 mL/hr over 30 Minutes Intravenous Every 6 hours 10/26/20 0840     10/25/20 2200  vancomycin (VANCOREADY) IVPB 1250 mg/250 mL  Status:  Discontinued        1,250 mg 166.7 mL/hr over 90 Minutes Intravenous Every 12 hours 10/25/20 1225 10/26/20 0840   10/20/20 2200  vancomycin (VANCOREADY) IVPB 1500 mg/300 mL  Status:  Discontinued        1,500 mg 150 mL/hr over 120 Minutes Intravenous Every 12 hours 10/20/20 1017 10/25/20 1225   10/20/20 1800  vancomycin (VANCOREADY) IVPB 1500 mg/300 mL  Status:  Discontinued        1,500 mg 150 mL/hr over 120 Minutes Intravenous Every 12 hours 10/20/20 1015 10/20/20 1017   10/20/20 1400  piperacillin-tazobactam (ZOSYN) IVPB 3.375 g  Status:  Discontinued        3.375 g 12.5 mL/hr over 240 Minutes Intravenous Every 8 hours 10/20/20 1015 10/26/20 0840   10/20/20 1200  vancomycin (VANCOREADY) IVPB 500 mg/100 mL        500 mg 100 mL/hr over 60 Minutes Intravenous  Once 10/20/20 1017 10/20/20 1620   10/20/20 0615  ceFEPIme (MAXIPIME) 2 g in sodium chloride 0.9 % 100 mL IVPB  2 g 200 mL/hr over 30 Minutes Intravenous STAT 10/20/20 0609 10/20/20 0936   10/20/20 0615  vancomycin (VANCOREADY) IVPB 1500 mg/300 mL        1,500 mg 150 mL/hr over 120 Minutes Intravenous STAT 10/20/20 0610 10/20/20 0815   10/20/20 0600  metroNIDAZOLE (FLAGYL) IVPB 500 mg        500 mg 100 mL/hr over 60 Minutes Intravenous  Once 10/20/20 0551 10/20/20 1035       Assessment/Plan Tobacco use  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 Magnus Ivan - 10/28/2020 - Packing out. Penrose to remain in place. Will arrange follow up. Continue abx x 10 days - Sitz baths  FEN: reg VTE: SCDs, okay for chemical prophylaxis  ID: cefepime/flagyl 1/4; Zosyn 1/4>1/10, Vanc 1/4>1/9; unasyn 1/10>>   LOS:  9 days    Jacinto Halim , [facility] 10/29/2020, 10:25 AM Please see Amion for pager number during day hours 7:00am-4:30pm

## 2020-10-29 NOTE — Progress Notes (Signed)
Patient was frustrated last night that pain needs were not met.  His pain was rated as 10/10, and he commented that it felt like 15/10 if he could rate it that high. He commented that none of his current pain medications gave him relief for very long. On call provider increased dilaudid to 1 mg and this gave him enough relief to sleep.  Patient was educated on the need to speak with provider today about pain needs and what time frame he needs to expect in transitioning off of IV pain medications before being discharged.  Patient expressed desire to be up and moving soon and working toward discharge.    Candie Mile, RN

## 2020-10-29 NOTE — Plan of Care (Signed)
  Problem: Clinical Measurements: Goal: Diagnostic test results will improve Outcome: Progressing Goal: Cardiovascular complication will be avoided Outcome: Progressing   Problem: Elimination: Goal: Will not experience complications related to bowel motility Outcome: Progressing Goal: Will not experience complications related to urinary retention Outcome: Progressing

## 2020-10-30 LAB — CREATININE, SERUM
Creatinine, Ser: 0.84 mg/dL (ref 0.61–1.24)
GFR, Estimated: >60 mL/min (ref >60)

## 2020-10-30 MED ORDER — OXYCODONE HCL 5 MG PO TABS
10.0000 mg | ORAL_TABLET | Freq: Four times a day (QID) | ORAL | Status: DC
Start: 2020-10-30 — End: 2020-10-31
  Administered 2020-10-30 – 2020-10-31 (×4): 10 mg via ORAL
  Filled 2020-10-30 (×5): qty 2

## 2020-10-30 MED ORDER — KETOROLAC TROMETHAMINE 15 MG/ML IJ SOLN
15.0000 mg | Freq: Three times a day (TID) | INTRAMUSCULAR | Status: DC
  Administered 2020-10-30 – 2020-10-31 (×3): 15 mg via INTRAVENOUS
  Filled 2020-10-30 (×3): qty 1

## 2020-10-30 MED ORDER — ENOXAPARIN SODIUM 40 MG/0.4ML ~~LOC~~ SOLN: 40.0000 mg | SUBCUTANEOUS | Status: DC

## 2020-10-30 NOTE — Evaluation (Signed)
Physical Therapy Evaluation Patient Details Name: Tyler [name] MRN: [id number] DOB: 05-10-1986 Today's Date: 10/30/2020   History of Present Illness  Patient is 35 y.o. male presenting on 10/20/20 for recurrent episode of peroneal discomfort starting since Christmas, pain radiating to right testicle with associated nausea, chills and night sweats and fever. He has a history of recurrent multiloculated perirectal abscesses and is now s/p TURP on 10/20/20 with I&D on 10/28/20 due to ongoing abscess and phlegmon in his perianal area decision.    Clinical Impression  Tyler [name] is 35 y.o. male admitted with above HPI and diagnosis. Patient is currently limited by functional impairments below (see PT problem list). Patient lives with his parents and has been limited due to multiple admission and surgeries for genitourinary problems. He states at baseline he mobilizes independently and enjoys working on his car however has been limited since December due to ongoing pain and was using RW for gait and had assistance from his parents for ADL's/mobility as needed. He currently is greatly limited by pain and requires Min assist for bed mobility, transfers, and gait with RW. Patient will benefit from continued skilled PT interventions to address impairments and progress independence with mobility, recommending intense follow up at CIR to progress mobility to safe level for discharge home. Acute PT will follow and progress as able. He will need to be able to negotiate a flight of stairs independently to safely return home.     Follow Up Recommendations CIR    Equipment Recommendations  Other (comment);Crutches (pt has RW, may need crutches for stair mobility)    Recommendations for Other Services OT consult;Rehab consult     Precautions / Restrictions Precautions Precautions: Fall Precaution Comments: penrose drain Restrictions Weight Bearing Restrictions: No      Mobility  Bed Mobility Overal bed  mobility: Needs Assistance Bed Mobility: Supine to Sit;Sit to Supine     Supine to sit: HOB elevated;Min assist Sit to supine: Min assist;HOB elevated   General bed mobility comments: pt taking substantial extra time to mobilize. Assist required for Lt LE to bring off EOB and raise onto bed to return to supine. pt very limited by pain.    Transfers Overall transfer level: Needs assistance Equipment used: Rolling walker (2 wheeled) Transfers: Sit to/from Stand Sit to Stand: Min assist;Min guard         General transfer comment: pt insistant on performing mobility as independently as possible. Min guard/assist for rise to RW, pt avoiding Lt LE WB due to pain. pt reaching back for bed rail to sit EOB at EOS and min assist for slow controled lowering.  Ambulation/Gait Ambulation/Gait assistance: Min guard;Min assist Gait Distance (Feet): 360 Feet Assistive device: Rolling walker (2 wheeled);1 person hand held assist Gait Pattern/deviations: Step-through pattern;Decreased step length - left;Decreased stance time - left;Decreased weight shift to left;Decreased dorsiflexion - left;Decreased stride length;Antalgic Gait velocity: decr   General Gait Details: pt with very antalgic gait and guarded steps with TDWB on Lt LE. pt with decreased hip/knee and dorsiflexion on Lt LE and sliding foot to advance at times. Patient had anterior trunk lean and was heavily reliant on UE support for gait. He required min guard for safety throughout ambulation in hallway. pt took ~ 25 minutes to ambulate 360' in hallway. He attempted 2-3 small steps in room for gait  and required min assist with 1HHA to prevent LOB.  Stairs            Psychologist, prison and probation services  Modified Rankin (Stroke Patients Only)       Balance Overall balance assessment: Needs assistance Sitting-balance support: Feet supported;Bilateral upper extremity supported Sitting balance-Leahy Scale: Fair Sitting balance - Comments: pt  leangin to Rt side due to pain on Lt Postural control: Right lateral lean Standing balance support: Bilateral upper extremity supported;During functional activity Standing balance-Leahy Scale: Poor Standing balance comment: HEAVY reliance on bi UE support for static and dynamic standing.                             Pertinent Vitals/Pain Pain Assessment: Faces Faces Pain Scale: Hurts whole lot Pain Location: Lt LE and peri region with movement Pain Descriptors / Indicators: Grimacing;Guarding Pain Intervention(s): Limited activity within patient's tolerance;Repositioned;Monitored during session;Patient requesting pain meds-RN notified    Home Living Family/patient expects to be discharged to:: Private residence Living Arrangements: Parent Available Help at Discharge: Family Type of Home: House Home Access: Stairs to enter   Secretary/administrator of Steps: 6 down Home Layout: Two level;Bed/bath upstairs Home Equipment: Walker - 2 wheels      Prior Function Level of Independence: Needs assistance   Gait / Transfers Assistance Needed: pt reports typically independent to mobilize. prior to Christmas he started needing help due to pain and began usign RW for mobility. He also reports having needed help to mobilize OOB and complete ADL's due to pain.  ADL's / Homemaking Assistance Needed: pt began having toruble back in [place] before christmas and has needed help for ADL's        Hand Dominance   Dominant Hand: Right    Extremity/Trunk Assessment   Upper Extremity Assessment Upper Extremity Assessment: Overall WFL for tasks assessed    Lower Extremity Assessment Lower Extremity Assessment: LLE deficits/detail LLE Deficits / Details: pt with guarding movements for Lt LE 2/2 pain. pt wtih PWB during gait and assist needed to mobilize in bed. LLE: Unable to fully assess due to pain    Cervical / Trunk Assessment Cervical / Trunk Assessment: Normal   Communication   Communication: No difficulties  Cognition Arousal/Alertness: Awake/alert Behavior During Therapy: WFL for tasks assessed/performed Overall Cognitive Status: Within Functional Limits for tasks assessed                                        General Comments      Exercises     Assessment/Plan    PT Assessment Patient needs continued PT services  PT Problem List Decreased activity tolerance;Decreased balance;Decreased mobility;Decreased knowledge of use of DME;Decreased safety awareness;Decreased knowledge of precautions;Pain       PT Treatment Interventions DME instruction;Gait training;Stair training;Functional mobility training;Therapeutic activities;Therapeutic exercise;Balance training;Patient/family education    PT Goals (Current goals can be found in the Care Plan section)  Acute Rehab PT Goals Patient Stated Goal: regain independence PT Goal Formulation: With patient Time For Goal Achievement: 11/13/20 Potential to Achieve Goals: Good    Frequency Min 4X/week   Barriers to discharge        Co-evaluation               AM-PAC PT "6 Clicks" Mobility  Outcome Measure Help needed turning from your back to your side while in a flat bed without using bedrails?: A Little Help needed moving from lying on your back to sitting on the side of a flat bed  without using bedrails?: A Little Help needed moving to and from a bed to a chair (including a wheelchair)?: A Little Help needed standing up from a chair using your arms (e.g., wheelchair or bedside chair)?: A Little Help needed to walk in hospital room?: A Little Help needed climbing 3-5 steps with a railing? : A Lot 6 Click Score: 17    End of Session Equipment Utilized During Treatment: Gait belt Activity Tolerance: Patient tolerated treatment well;Patient limited by pain Patient left: in bed;with call bell/phone within reach;with bed alarm set;with nursing/sitter in room Nurse  Communication: Mobility status;Patient requests pain meds PT Visit Diagnosis: Unsteadiness on feet (R26.81);Difficulty in walking, not elsewhere classified (R26.2);Pain Pain - Right/Left: Left Pain - part of body:  (LE adn perenium)    Time: 5993-5701 PT Time Calculation (min) (ACUTE ONLY): 52 min   Charges:   PT Evaluation $PT Eval Moderate Complexity: 1 Mod PT Treatments $Gait Training: 23-37 mins        Wynn Maudlin, DPT Acute Rehabilitation Services Office [phone] Pager [phone]    Anitra Lauth 10/30/2020, 7:11 PM

## 2020-10-30 NOTE — Progress Notes (Signed)
PROGRESS NOTE    Tyler [name]  NWG:[id number] DOB: [birth year] DOA: 10/20/2020 PCP: Patient, No Pcp Per     Brief Narrative:  Tyler [name] is a 35 y.o. year old male with medical history significant for recurrent prostate abscess (he has had 2 prior admissions: 10/4-10/8 with unsuccessful transurethral moving followed by transrectal drain placement with wound cultures growing strep angina gnosis, followed by hospitalization 10/19-10/25 for sepsis due to perineal/right scrotal abscess requiring I&D).  During last hospitalization patient underwent cystourethroscopy and I&D of abscess on 10/19, completed a week of empiric IV vancomycin, cefepime and Flagyl, blood cultures remain unremarkable at that time and abscess culture showed mixed anaerobic flora and was discharged 2 to 3 weeks of oral Augmentin.  Patient presented on 1/4 with recurrent episode of peroneal discomfort starting since Christmas, pain radiating to right testicle with associated nausea, chills and night sweats and fever of 103 at home.  He was admitted for sepsis secondary to recurrent multiloculated perirectal abscesses, given his history of polymicrobial abscesses he was started on empiric vancomycin and Zosyn and underwent operative I&D of perineal abscess by urology as well as transurethral resection of the prostate abscess on 1/4.  Repeat CT on 1/11 showed new left perianal phlegmon.  General surgery was consulted.  Patient underwent I&D of perianal abscess on 1/12.  New events last 24 hours / Subjective: Continues to have severe pain, very upset that the Penrose drain remains in place.  He is not sure how he will manage everything at home.  Assessment & Plan:   Principal Problem:   Sepsis [facility]) Active Problems:   Perineal abscess   Tobacco abuse   Sepsis secondary to recurrent peri-prostatic and perineal abscess status post TURP and I&D of perineal abscess on 1/4, I&D of perianal abscess on 1/12 -Intraoperative wound  culture positive for Fusobacterium, Bacteroides -Appreciate urology, general surgery -Follow-up with general surgery as scheduled on 11/12/2020.  Penrose drain in place on discharge. -On Unasyn -Pain control.  Increase scheduled oxycodone, add scheduled Toradol and attempt to decrease breakthrough IV Dilaudid use  Tobacco use -Nicotine patch, cessation counseling   DVT prophylaxis: SCDs enoxaparin (LOVENOX) injection 40 mg Start: 10/30/20 1000 Place and maintain sequential compression device Start: 10/28/20 1142  Code Status: Full code Family Communication: No family at bedside Disposition Plan:  Status is: Inpatient  Remains inpatient appropriate because:Ongoing active pain requiring inpatient pain management   Dispo: The patient is from: Home              Anticipated d/c is to: Home              Anticipated d/c date is: 2 days              Patient currently is not medically stable to d/c.  Remains on IV antibiotics and IV pain medication.  Continue to work on weaning down IV pain medication    Antimicrobials:  Anti-infectives (From admission, onward)   Start     Dose/Rate Route Frequency Ordered Stop   10/26/20 1400  Ampicillin-Sulbactam (UNASYN) 3 g in sodium chloride 0.9 % 100 mL IVPB        3 g 200 mL/hr over 30 Minutes Intravenous Every 6 hours 10/26/20 0840     10/25/20 2200  vancomycin (VANCOREADY) IVPB 1250 mg/250 mL  Status:  Discontinued        1,250 mg 166.7 mL/hr over 90 Minutes Intravenous Every 12 hours 10/25/20 1225 10/26/20 0840   10/20/20 2200  vancomycin (  VANCOREADY) IVPB 1500 mg/300 mL  Status:  Discontinued        1,500 mg 150 mL/hr over 120 Minutes Intravenous Every 12 hours 10/20/20 1017 10/25/20 1225   10/20/20 1800  vancomycin (VANCOREADY) IVPB 1500 mg/300 mL  Status:  Discontinued        1,500 mg 150 mL/hr over 120 Minutes Intravenous Every 12 hours 10/20/20 1015 10/20/20 1017   10/20/20 1400  piperacillin-tazobactam (ZOSYN) IVPB 3.375 g  Status:   Discontinued        3.375 g 12.5 mL/hr over 240 Minutes Intravenous Every 8 hours 10/20/20 1015 10/26/20 0840   10/20/20 1200  vancomycin (VANCOREADY) IVPB 500 mg/100 mL        500 mg 100 mL/hr over 60 Minutes Intravenous  Once 10/20/20 1017 10/20/20 1620   10/20/20 0615  ceFEPIme (MAXIPIME) 2 g in sodium chloride 0.9 % 100 mL IVPB        2 g 200 mL/hr over 30 Minutes Intravenous STAT 10/20/20 0609 10/20/20 0936   10/20/20 0615  vancomycin (VANCOREADY) IVPB 1500 mg/300 mL        1,500 mg 150 mL/hr over 120 Minutes Intravenous STAT 10/20/20 0610 10/20/20 0815   10/20/20 0600  metroNIDAZOLE (FLAGYL) IVPB 500 mg        500 mg 100 mL/hr over 60 Minutes Intravenous  Once 10/20/20 0551 10/20/20 1035       Objective: Vitals:   10/29/20 0530 10/29/20 1150 10/29/20 2039 10/30/20 0520  BP:  138/74 139/84 126/67  Pulse:  88 86 68  Resp:  19 16 16   Temp:  98.6 F (37 C) 97.7 F (36.5 C) 97.9 F (36.6 C)  TempSrc:  Oral Oral Oral  SpO2:  98% 98% 97%  Weight: 84.2 kg     Height:        Intake/Output Summary (Last 24 hours) at 10/30/2020 1048 Last data filed at 10/30/2020 0839 Gross per 24 hour  Intake 240 ml  Output 2675 ml  Net -2435 ml   Filed Weights   10/26/20 0500 10/27/20 0516 10/29/20 0530  Weight: 84.4 kg 82.6 kg 84.2 kg    Examination: General exam: Appears calm and comfortable  Respiratory system: Clear to auscultation. Respiratory effort normal. Cardiovascular system: S1 & S2 heard, tachycardic, regular rhythm. No pedal edema. Gastrointestinal system: Abdomen is nondistended, soft and nontender. Normal bowel sounds heard. Central nervous system: Alert and oriented. Non focal exam. Speech clear  Extremities: Symmetric in appearance bilaterally  Psychiatry: Stable  Data Reviewed: I have personally reviewed following labs and imaging studies  CBC: Recent Labs  Lab 10/25/20 0629 10/26/20 0958 10/27/20 0545 10/28/20 0557 10/29/20 0605  WBC 7.2 7.4 6.6 5.5 8.9   NEUTROABS  --   --   --  2.8  --   HGB 14.9 13.3 14.0 13.2 12.3*  HCT 45.0 41.0 42.3 41.2 38.8*  MCV 80.6 81.7 80.7 82.6 84.2  PLT 372 529* 558* 545* 555*   Basic Metabolic Panel: Recent Labs  Lab 10/24/20 0620 10/25/20 0629 10/26/20 0539 10/27/20 0545 10/28/20 0557 10/29/20 0605 10/30/20 0601  NA 135 135  --  136 137 136  --   K 4.0 4.6  --  4.2 4.2 3.9  --   CL 101 100  --  98 100 100  --   CO2 25 23  --  28 26 26   --   GLUCOSE 112* 105*  --  92 92 99  --   BUN 11 14  --  15 13 14   --   CREATININE 0.96 0.95 0.95 1.01 0.89 0.98 0.84  CALCIUM 8.9 9.2  --  9.4 9.2 9.0  --    GFR: Estimated Creatinine Clearance: 138.6 mL/min (by C-G formula based on SCr of 0.84 mg/dL). Liver Function Tests: No results for input(s): AST, ALT, ALKPHOS, BILITOT, PROT, ALBUMIN in the last 168 hours. No results for input(s): LIPASE, AMYLASE in the last 168 hours. No results for input(s): AMMONIA in the last 168 hours. Coagulation Profile: No results for input(s): INR, PROTIME in the last 168 hours. Cardiac Enzymes: No results for input(s): CKTOTAL, CKMB, CKMBINDEX, TROPONINI in the last 168 hours. BNP (last 3 results) No results for input(s): PROBNP in the last 8760 hours. HbA1C: No results for input(s): HGBA1C in the last 72 hours. CBG: Recent Labs  Lab 10/23/20 2205  GLUCAP 106*   Lipid Profile: No results for input(s): CHOL, HDL, LDLCALC, TRIG, CHOLHDL, LDLDIRECT in the last 72 hours. Thyroid Function Tests: No results for input(s): TSH, T4TOTAL, FREET4, T3FREE, THYROIDAB in the last 72 hours. Anemia Panel: No results for input(s): VITAMINB12, FOLATE, FERRITIN, TIBC, IRON, RETICCTPCT in the last 72 hours. Sepsis Labs: No results for input(s): PROCALCITON, LATICACIDVEN in the last 168 hours.  Recent Results (from the past 240 hour(s))  Surgical pcr screen     Status: None   Collection Time: 10/20/20  1:24 PM   Specimen: Nasal Mucosa; Nasal Swab  Result Value Ref Range Status    MRSA, PCR NEGATIVE NEGATIVE Final   Staphylococcus aureus NEGATIVE NEGATIVE Final    Comment: (NOTE) The Xpert SA Assay (FDA approved for NASAL specimens in patients 75 years of age and older), is one component of a comprehensive surveillance program. It is not intended to diagnose infection nor to guide or monitor treatment. Performed at [facility], [address] [place], [place]   Aerobic/Anaerobic Culture (surgical/deep wound)     Status: None   Collection Time: 10/20/20  5:07 PM   Specimen: PATH Soft tissue  Result Value Ref Range Status   Specimen Description PERINEAL ABSCESS  Final   Special Requests DRAINAGE  Final   Gram Stain   Final    MODERATE WBC PRESENT,BOTH PMN AND MONONUCLEAR FEW GRAM POSITIVE COCCI IN PAIRS IN CHAINS MODERATE GRAM NEGATIVE RODS RARE GRAM POSITIVE RODS Performed at [facility], [address], [place], [place]    Culture   Final    MODERATE BACTEROIDES ORALIS BETA LACTAMASE POSITIVE MODERATE FUSOBACTERIUM SPECIES    Report Status 10/25/2020 FINAL  Final  Aerobic Culture (superficial specimen)     Status: None   Collection Time: 10/20/20  5:07 PM   Specimen: Wound  Result Value Ref Range Status   Specimen Description WOUND PERINEAL ABSCESS  Final   Special Requests SWAB  Final   Gram Stain NO WBC SEEN NO ORGANISMS SEEN   Final   Culture   Final    FEW NORMAL SKIN FLORA Performed at [facility], [address] [place], [place]    Report Status 10/23/2020 FINAL  Final  Culture, blood (routine x 2)     Status: None   Collection Time: 10/21/20  1:35 PM   Specimen: BLOOD RIGHT HAND  Result Value Ref Range Status   Specimen Description   Final    BLOOD RIGHT HAND Performed at [facility], [address], [place], [place]    Special Requests  Final    BOTTLES DRAWN AEROBIC ONLY Blood Culture adequate volume Performed at [facility], [address] [place], [place] [address]    Culture   Final    NO GROWTH 5 DAYS Performed at [facility], [address] [place], [place] [address]    Report Status 10/26/2020 FINAL  Final  Culture, blood (routine x 2)     Status: None   Collection Time: 10/21/20  1:35 PM   Specimen: BLOOD  Result Value Ref Range Status   Specimen Description   Final    BLOOD RIGHT ANTECUBITAL Performed at [facility], [address] [place], [place] [address]    Special Requests   Final    BOTTLES DRAWN AEROBIC AND ANAEROBIC Blood Culture adequate volume Performed at [facility], [address] [place], [place] [address]    Culture   Final    NO GROWTH 5 DAYS Performed at [facility], [address] [place], [place] [address]    Report Status 10/26/2020 FINAL  Final      Radiology Studies: No results found.    Scheduled Meds: . enoxaparin (LOVENOX) injection  40 mg Subcutaneous Q24H  . ketorolac  15 mg Intravenous Q8H  . nicotine  21 mg Transdermal Daily  . oxyCODONE  10 mg Oral Q6H  . polyethylene glycol  17 g Oral BID  . senna-docusate  2 tablet Oral BID   Continuous Infusions: . sodium chloride 10 mL/hr at 10/26/20 1400  . ampicillin-sulbactam (UNASYN) IV 3 g (10/30/20 0505)     LOS: 10 days      Time spent: 25 minutes   Noralee StainJennifer Bernadett Milian, DO [facility] 10/30/2020, 10:48 AM   Available via Epic secure chat 7am-7pm After these hours, please refer to coverage provider listed on [email or web address]

## 2020-10-31 LAB — CREATININE, SERUM
Creatinine, Ser: 0.89 mg/dL (ref 0.61–1.24)
GFR, Estimated: >60 mL/min (ref >60)

## 2020-10-31 MED ORDER — KETOROLAC TROMETHAMINE 10 MG PO TABS
10.0000 mg | ORAL_TABLET | Freq: Three times a day (TID) | ORAL | Status: DC
  Administered 2020-10-31 – 2020-11-01 (×3): 10 mg via ORAL
  Filled 2020-10-31 (×4): qty 1

## 2020-10-31 MED ORDER — OXYCODONE HCL 5 MG PO TABS: 10.0000 mg | ORAL_TABLET | Freq: Four times a day (QID) | ORAL | Status: DC

## 2020-10-31 MED ORDER — HYDROMORPHONE HCL 1 MG/ML IJ SOLN
1.0000 mg | INTRAMUSCULAR | Status: DC | PRN
  Administered 2020-10-31 (×3): 1 mg via INTRAVENOUS
  Filled 2020-10-31 (×3): qty 1

## 2020-10-31 MED ORDER — OXYCODONE HCL 5 MG PO TABS
10.0000 mg | ORAL_TABLET | Freq: Three times a day (TID) | ORAL | Status: DC
  Filled 2020-10-31: qty 2

## 2020-10-31 MED ORDER — AMOXICILLIN-POT CLAVULANATE 875-125 MG PO TABS
1.0000 | ORAL_TABLET | Freq: Two times a day (BID) | ORAL | Status: DC
  Administered 2020-10-31 – 2020-11-01 (×2): 1 via ORAL
  Filled 2020-10-31 (×2): qty 1

## 2020-10-31 NOTE — Progress Notes (Signed)
Physical Therapy Treatment Patient Details Name: Tyler [name] MRN: [id number] DOB: Sep 25, 1986 Today's Date: 10/31/2020    History of Present Illness Patient is 35 y.o. male presenting on 10/20/20 for recurrent episode of peroneal discomfort starting since Christmas, pain radiating to right testicle with associated nausea, chills and night sweats and fever. He has a history of recurrent multiloculated perirectal abscesses and is now s/p TURP on 10/20/20 with I&D on 10/28/20 due to ongoing abscess and phlegmon in his perianal area decision.    PT Comments    Patient progressing well using crutches this session able to ambulate with less pain and negotiated steps with crutch with minguard A and should be able to access home.  Patient eager for d/c, no longer would need inpatient rehab.  Educated in Corporate investment banker. Will follow up if not d/c.    Follow Up Recommendations  No PT follow up     Equipment Recommendations  Crutches    Recommendations for Other Services       Precautions / Restrictions Precautions Precautions: Fall Precaution Comments: penrose drain    Mobility  Bed Mobility Overal bed mobility: Needs Assistance Bed Mobility: Supine to Sit;Sit to Supine     Supine to sit: Min assist Sit to supine: Min assist   General bed mobility comments: Increased time and min assist to move LLE to the edge of bed.  Transfers Overall transfer level: Needs assistance Equipment used: Crutches Transfers: Sit to/from Stand Sit to Stand: Min guard         General transfer comment: cues for technique with crutches  Ambulation/Gait Ambulation/Gait assistance: Supervision;Min guard Gait Distance (Feet): 400 Feet Assistive device: Crutches Gait Pattern/deviations: Step-to pattern;Step-through pattern;Decreased stride length     General Gait Details: min cues for sequencing and posture throughout   Stairs Stairs: Yes Stairs assistance: Min guard;Supervision Stair Management: One  rail Right;With crutches Number of Stairs: 10 General stair comments: cues for sequencing and proximity to railing   Wheelchair Mobility    Modified Rankin (Stroke Patients Only)       Balance Overall balance assessment: Needs assistance   Sitting balance-Leahy Scale: Good     Standing balance support: No upper extremity supported Standing balance-Leahy Scale: Fair Standing balance comment: able to transition crutches from under armpits for sit<>stand                            Cognition Arousal/Alertness: Awake/alert Behavior During Therapy: WFL for tasks assessed/performed Overall Cognitive Status: Within Functional Limits for tasks assessed                                        Exercises      General Comments General comments (skin integrity, edema, etc.): Educated in drying off crutch tips if they get wet due to slip risk      Pertinent Vitals/Pain Pain Assessment: Faces Faces Pain Scale: Hurts even more Pain Location: Lt LE and peri region with movement Pain Descriptors / Indicators: Grimacing;Guarding Pain Intervention(s): Monitored during session;Repositioned    Home Living Family/patient expects to be discharged to:: Private residence Living Arrangements: Parent Available Help at Discharge: Family Type of Home: House Home Access: Stairs to enter   Home Layout: Two level;Bed/bath upstairs Home Equipment: Environmental consultant - 2 wheels      Prior Function Level of Independence: Needs assistance  Gait / Transfers  Assistance Needed: Reports using RW at home due to pain in LLE with weight bearing and movement. ADL's / Homemaking Assistance Needed: Patient reports ADLs difficult do to pain - but able to perform. Currently the hardest task is donning LLE lower body clothing due to pain with hip flexion.     PT Goals (current goals can now be found in the care plan section) Acute Rehab PT Goals Patient Stated Goal: Have pain reduced to  return to independence Progress towards PT goals: Progressing toward goals    Frequency    Min 3X/week      PT Plan Discharge plan needs to be updated    Co-evaluation              AM-PAC PT "6 Clicks" Mobility   Outcome Measure  Help needed turning from your back to your side while in a flat bed without using bedrails?: None Help needed moving from lying on your back to sitting on the side of a flat bed without using bedrails?: A Little Help needed moving to and from a bed to a chair (including a wheelchair)?: A Little Help needed standing up from a chair using your arms (e.g., wheelchair or bedside chair)?: A Little Help needed to walk in hospital room?: A Little Help needed climbing 3-5 steps with a railing? : A Little 6 Click Score: 19    End of Session   Activity Tolerance: Patient tolerated treatment well Patient left: in bed;with call bell/phone within reach;with bed alarm set Nurse Communication: Mobility status;Other (comment) (pt eager for d/c) PT Visit Diagnosis: Difficulty in walking, not elsewhere classified (R26.2);Pain Pain - Right/Left: Left Pain - part of body: Leg     Time: 1321-1405 PT Time Calculation (min) (ACUTE ONLY): 44 min  Charges:  $Gait Training: 23-37 mins $Self Care/Home Management: 8-22                     Sheran Lawless, PT Acute Rehabilitation Services Pager:[phone] Office:[phone] 10/31/2020    Tyler [name] 10/31/2020, 3:02 PM

## 2020-10-31 NOTE — Progress Notes (Signed)
PROGRESS NOTE    Tyler CoopDerek [name]  NWG:[id number] DOB: 1986-08-29 DOA: 10/20/2020 PCP: Patient, No Pcp Per     Brief Narrative:  Tyler CoopDerek [name] is a 35 y.o. year old male with medical history significant for recurrent prostate abscess (he has had 2 prior admissions: 10/4-10/8 with unsuccessful transurethral moving followed by transrectal drain placement with wound cultures growing strep angina gnosis, followed by hospitalization 10/19-10/25 for sepsis due to perineal/right scrotal abscess requiring I&D).  During last hospitalization patient underwent cystourethroscopy and I&D of abscess on 10/19, completed a week of empiric IV vancomycin, cefepime and Flagyl, blood cultures remain unremarkable at that time and abscess culture showed mixed anaerobic flora and was discharged 2 to 3 weeks of oral Augmentin.  Patient presented on 1/4 with recurrent episode of peroneal discomfort starting since Christmas, pain radiating to right testicle with associated nausea, chills and night sweats and fever of 103 at home.  He was admitted for sepsis secondary to recurrent multiloculated perirectal abscesses, given his history of polymicrobial abscesses he was started on empiric vancomycin and Zosyn and underwent operative I&D of perineal abscess by urology as well as transurethral resection of the prostate abscess on 1/4.  Repeat CT on 1/11 showed new left perianal phlegmon.  General surgery was consulted.  Patient underwent I&D of perianal abscess on 1/12.  New events last 24 hours / Subjective: States that he was woken up this morning to receive his scheduled oxycodone.  Very disgruntled regarding his pain regimen, prefers Dilaudid.  We discussed patient's pending discharge, needing to work on weaning off of IV therapies.   Assessment & Plan:   Principal Problem:   Sepsis [facility](HCC) Active Problems:   Perineal abscess   Tobacco abuse   Sepsis secondary to recurrent peri-prostatic and perineal abscess status post  TURP and I&D of perineal abscess on 1/4, I&D of perianal abscess on 1/12 -Intraoperative wound culture positive for Fusobacterium, Bacteroides -Appreciate urology, general surgery -Follow-up with general surgery as scheduled on 11/12/2020.  Penrose drain in place on discharge. -On Unasyn -Pain control. Schedule oxycodone, Toradol p.o. during the daytime.  IV Dilaudid to be used sparingly for breakthrough pain  Tobacco use -Nicotine patch, cessation counseling   DVT prophylaxis: SCDs enoxaparin (LOVENOX) injection 40 mg Start: 10/30/20 1000 Place and maintain sequential compression device Start: 10/28/20 1142  Code Status: Full code Family Communication: No family at bedside Disposition Plan:  Status is: Inpatient  Remains inpatient appropriate because:Ongoing active pain requiring inpatient pain management   Dispo: The patient is from: Home              Anticipated d/c is to: CIR              Anticipated d/c date is: 2 days              Patient currently is not medically stable to d/c.  Remains on IV antibiotics and IV pain medication.  Continue to work on weaning down IV pain medication    Antimicrobials:  Anti-infectives (From admission, onward)   Start     Dose/Rate Route Frequency Ordered Stop   10/26/20 1400  Ampicillin-Sulbactam (UNASYN) 3 g in sodium chloride 0.9 % 100 mL IVPB        3 g 200 mL/hr over 30 Minutes Intravenous Every 6 hours 10/26/20 0840     10/25/20 2200  vancomycin (VANCOREADY) IVPB 1250 mg/250 mL  Status:  Discontinued        1,250 mg 166.7 mL/hr over 90 Minutes  Intravenous Every 12 hours 10/25/20 1225 10/26/20 0840   10/20/20 2200  vancomycin (VANCOREADY) IVPB 1500 mg/300 mL  Status:  Discontinued        1,500 mg 150 mL/hr over 120 Minutes Intravenous Every 12 hours 10/20/20 1017 10/25/20 1225   10/20/20 1800  vancomycin (VANCOREADY) IVPB 1500 mg/300 mL  Status:  Discontinued        1,500 mg 150 mL/hr over 120 Minutes Intravenous Every 12 hours  10/20/20 1015 10/20/20 1017   10/20/20 1400  piperacillin-tazobactam (ZOSYN) IVPB 3.375 g  Status:  Discontinued        3.375 g 12.5 mL/hr over 240 Minutes Intravenous Every 8 hours 10/20/20 1015 10/26/20 0840   10/20/20 1200  vancomycin (VANCOREADY) IVPB 500 mg/100 mL        500 mg 100 mL/hr over 60 Minutes Intravenous  Once 10/20/20 1017 10/20/20 1620   10/20/20 0615  ceFEPIme (MAXIPIME) 2 g in sodium chloride 0.9 % 100 mL IVPB        2 g 200 mL/hr over 30 Minutes Intravenous STAT 10/20/20 0609 10/20/20 0936   10/20/20 0615  vancomycin (VANCOREADY) IVPB 1500 mg/300 mL        1,500 mg 150 mL/hr over 120 Minutes Intravenous STAT 10/20/20 0610 10/20/20 0815   10/20/20 0600  metroNIDAZOLE (FLAGYL) IVPB 500 mg        500 mg 100 mL/hr over 60 Minutes Intravenous  Once 10/20/20 0551 10/20/20 1035       Objective: Vitals:   10/31/20 0300 10/31/20 0400 10/31/20 0500 10/31/20 0529  BP:    132/70  Pulse:    68  Resp: 14 16 17 17   Temp:    97.6 F (36.4 C)  TempSrc:      SpO2:    100%  Weight:      Height:        Intake/Output Summary (Last 24 hours) at 10/31/2020 1050 Last data filed at 10/31/2020 0500 Gross per 24 hour  Intake 820 ml  Output 2350 ml  Net -1530 ml   Filed Weights   10/26/20 0500 10/27/20 0516 10/29/20 0530  Weight: 84.4 kg 82.6 kg 84.2 kg    Examination: General exam: Appears calm and comfortable  Respiratory system: Clear to auscultation. Respiratory effort normal. Cardiovascular system: S1 & S2 heard, RRR. No pedal edema. Gastrointestinal system: Abdomen is nondistended, soft and nontender. Normal bowel sounds heard. Central nervous system: Alert and oriented. Non focal exam. Speech clear  Extremities: Symmetric in appearance bilaterally  Psychiatry: Judgement and insight appear stable.  Data Reviewed: I have personally reviewed following labs and imaging studies  CBC: Recent Labs  Lab 10/25/20 0629 10/26/20 0958 10/27/20 0545 10/28/20 0557  10/29/20 0605  WBC 7.2 7.4 6.6 5.5 8.9  NEUTROABS  --   --   --  2.8  --   HGB 14.9 13.3 14.0 13.2 12.3*  HCT 45.0 41.0 42.3 41.2 38.8*  MCV 80.6 81.7 80.7 82.6 84.2  PLT 372 529* 558* 545* 555*   Basic Metabolic Panel: Recent Labs  Lab 10/25/20 0629 10/26/20 0539 10/27/20 0545 10/28/20 0557 10/29/20 0605 10/30/20 0601 10/31/20 0738  NA 135  --  136 137 136  --   --   K 4.6  --  4.2 4.2 3.9  --   --   CL 100  --  98 100 100  --   --   CO2 23  --  28 26 26   --   --  GLUCOSE 105*  --  92 92 99  --   --   BUN 14  --  15 13 14   --   --   CREATININE 0.95   < > 1.01 0.89 0.98 0.84 0.89  CALCIUM 9.2  --  9.4 9.2 9.0  --   --    < > = values in this interval not displayed.   GFR: Estimated Creatinine Clearance: 130.8 mL/min (by C-G formula based on SCr of 0.89 mg/dL). Liver Function Tests: No results for input(s): AST, ALT, ALKPHOS, BILITOT, PROT, ALBUMIN in the last 168 hours. No results for input(s): LIPASE, AMYLASE in the last 168 hours. No results for input(s): AMMONIA in the last 168 hours. Coagulation Profile: No results for input(s): INR, PROTIME in the last 168 hours. Cardiac Enzymes: No results for input(s): CKTOTAL, CKMB, CKMBINDEX, TROPONINI in the last 168 hours. BNP (last 3 results) No results for input(s): PROBNP in the last 8760 hours. HbA1C: No results for input(s): HGBA1C in the last 72 hours. CBG: No results for input(s): GLUCAP in the last 168 hours. Lipid Profile: No results for input(s): CHOL, HDL, LDLCALC, TRIG, CHOLHDL, LDLDIRECT in the last 72 hours. Thyroid Function Tests: No results for input(s): TSH, T4TOTAL, FREET4, T3FREE, THYROIDAB in the last 72 hours. Anemia Panel: No results for input(s): VITAMINB12, FOLATE, FERRITIN, TIBC, IRON, RETICCTPCT in the last 72 hours. Sepsis Labs: No results for input(s): PROCALCITON, LATICACIDVEN in the last 168 hours.  Recent Results (from the past 240 hour(s))  Culture, blood (routine x 2)     Status: None    Collection Time: 10/21/20  1:35 PM   Specimen: BLOOD RIGHT HAND  Result Value Ref Range Status   Specimen Description   Final    BLOOD RIGHT HAND Performed at [facility], [address] [place], [place]    Special Requests   Final    BOTTLES DRAWN AEROBIC ONLY Blood Culture adequate volume Performed at [facility], [address], [place], [place]    Culture   Final    NO GROWTH 5 DAYS Performed at [facility], [address] [place], [place]    Report Status 10/26/2020 FINAL  Final  Culture, blood (routine x 2)     Status: None   Collection Time: 10/21/20  1:35 PM   Specimen: BLOOD  Result Value Ref Range Status   Specimen Description   Final    BLOOD RIGHT ANTECUBITAL Performed at [facility], [address] [place], [place]    Special Requests   Final    BOTTLES DRAWN AEROBIC AND ANAEROBIC Blood Culture adequate volume Performed at [facility], [address], [place], [place]    Culture   Final    NO GROWTH 5 DAYS Performed at [facility], [address] [place], [place]    Report Status 10/26/2020 FINAL  Final      Radiology Studies: No results found.    Scheduled Meds: . enoxaparin (LOVENOX) injection  40 mg Subcutaneous Q24H  . ketorolac  10 mg Oral Q8H  . nicotine  21 mg Transdermal Daily  . oxyCODONE  10 mg Oral TID  . polyethylene glycol  17 g Oral BID  . senna-docusate  2 tablet Oral BID   Continuous Infusions: . sodium chloride 10 mL/hr at 10/26/20 1400  . ampicillin-sulbactam (UNASYN) IV 3 g (10/31/20 0540)  LOS: 11 days      Time spent: 25 minutes   Noralee Stain, DO [facility] 10/31/2020, 10:50 AM   Available via Epic secure chat 7am-7pm After these hours, please refer to coverage provider listed on [email or web address]

## 2020-10-31 NOTE — Progress Notes (Signed)
I just went in to patient's room to reassess his pain level and noted him snoring and resting comfortably. Will continue to monitor.

## 2020-10-31 NOTE — Evaluation (Addendum)
Occupational Therapy Evaluation Patient Details Name: Tyler [name] MRN: [id number] DOB: 07-Feb-1986 Today's Date: 10/31/2020    History of Present Illness Patient is 35 y.o. male presenting on 10/20/20 for recurrent episode of peroneal discomfort starting since Christmas, pain radiating to right testicle with associated nausea, chills and night sweats and fever. He has a history of recurrent multiloculated perirectal abscesses and is now s/p TURP on 10/20/20 with I&D on 10/28/20 due to ongoing abscess and phlegmon in his perianal area decision.   Clinical Impression   MR. Tyler [name] is a 35 year old man who presents with above medical history and complaints of pain. Patient's perirectal pain limiting his ability to independently ambulate and perform LB ADLs. Patient reports left hip movement increases pain and limiting his ability to don clothing on left leg and pain in left leg with weight bearing resulting in the need for walker and slow gait. Patient ambulated approx 350 feet in hallway with RW and reports fatigue in arms. Patient and therapist discussed compensatory strategies for ADLs during walk. Patient will benefit from skilled OT services while in hospital to improve deficits and learn compensatory strategies as needed in order to be modified independent. Will provide patient with AE to assist with ADLs. Once pain improved patient should progress well.     Follow Up Recommendations  No OT follow up    Equipment Recommendations  Other (comment);Tub/shower seat (AE)    Recommendations for Other Services       Precautions / Restrictions Precautions Precaution Comments: penrose drain      Mobility Bed Mobility Overal bed mobility: Needs Assistance Bed Mobility: Supine to Sit     Supine to sit: HOB elevated;Min assist     General bed mobility comments: Increased time and min assist to move LLE to the edge of bed.    Transfers Overall transfer level: Needs assistance Equipment  used: Rolling walker (2 wheeled) Transfers: Sit to/from Stand Sit to Stand: Min guard         General transfer comment: Min guard to ambulate in hall with RW. Bears weight through arms to limit weight bearing through LLE due to pain. Arm fatigue on walker. Patient expressing he wans to try crutces. Patient wanted to try ambulation without walker. able to take steps from bathroom door to toilet and transfer with min guard.    Balance Overall balance assessment: Mild deficits observed, not formally tested                                         ADL either performed or assessed with clinical judgement   ADL Overall ADL's : Needs assistance/impaired Eating/Feeding: Independent   Grooming: Independent;Standing;Oral care;Wash/dry face Grooming Details (indicate cue type and reason): stood at sink to wash face and brush teeth. No asstance needed. Upper Body Bathing: Independent   Lower Body Bathing: Minimal assistance   Upper Body Dressing : Independent   Lower Body Dressing: Minimal assistance Lower Body Dressing Details (indicate cue type and reason): Needs assistance to don left sock and left pant leg due to pain with left hip movement. Has shoes that he can slide his feet into. Toilet Transfer: Museum/gallery conservator Details (indicate cue type and reason): increased time to transfer due to pain. Toileting- Clothing Manipulation and Hygiene: Supervision/safety;Sit to/from stand       Functional mobility during ADLs: Min guard;Rolling walker  Vision Baseline Vision/History: No visual deficits       Perception     Praxis      Pertinent Vitals/Pain Pain Assessment: Faces Faces Pain Scale: Hurts whole lot Pain Location: Lt LE and peri region with movement Pain Descriptors / Indicators: Grimacing;Guarding Pain Intervention(s): Monitored during session;Limited activity within patient's tolerance;Premedicated before session      Hand Dominance Right   Extremity/Trunk Assessment Upper Extremity Assessment Upper Extremity Assessment: Overall WFL for tasks assessed (WNL ROM, 5/5 strength)           Communication Communication Communication: No difficulties   Cognition Arousal/Alertness: Awake/alert Behavior During Therapy: WFL for tasks assessed/performed Overall Cognitive Status: Within Functional Limits for tasks assessed                                     General Comments       Exercises     Shoulder Instructions      Home Living Family/patient expects to be discharged to:: Private residence Living Arrangements: Parent Available Help at Discharge: Family Type of Home: House Home Access: Stairs to enter Secretary/administrator of Steps: 6 down   Home Layout: Two level;Bed/bath upstairs Alternate Level Stairs-Number of Steps: 14 Alternate Level Stairs-Rails: Right Bathroom Shower/Tub: Chief Strategy Officer: Standard Bathroom Accessibility: Yes   Home Equipment: Walker - 2 wheels          Prior Functioning/Environment Level of Independence: Needs assistance  Gait / Transfers Assistance Needed: Reports using RW at home due to pain in LLE with weight bearing and movement. ADL's / Homemaking Assistance Needed: Patient reports ADLs difficult do to pain - but able to perform. Currently the hardest task is donning LLE lower body clothing due to pain with hip flexion.            OT Problem List: Decreased activity tolerance;Pain      OT Treatment/Interventions: Self-care/ADL training;Therapeutic activities;Patient/family education    OT Goals(Current goals can be found in the care plan section) Acute Rehab OT Goals Patient Stated Goal: Have pain reduced to return to independence OT Goal Formulation: With patient Time For Goal Achievement: 11/14/20 Potential to Achieve Goals: Good  OT Frequency: Min 2X/week   Barriers to D/C:             Co-evaluation              AM-PAC OT "6 Clicks" Daily Activity     Outcome Measure Help from another person eating meals?: None Help from another person taking care of personal grooming?: None Help from another person toileting, which includes using toliet, bedpan, or urinal?: A Little Help from another person bathing (including washing, rinsing, drying)?: A Little Help from another person to put on and taking off regular upper body clothing?: None Help from another person to put on and taking off regular lower body clothing?: A Little 6 Click Score: 21   End of Session Equipment Utilized During Treatment: Rolling walker Nurse Communication:  (RN and CNA notiied of patient being in bathroom.)  Activity Tolerance: Patient limited by pain Patient left:  (left on toilet.)  OT Visit Diagnosis: Pain                Time: 4742-5956 OT Time Calculation (min): 41 min Charges:  OT General Charges $OT Visit: 1 Visit OT Evaluation $OT Eval Low Complexity: 1 Low OT Treatments $Therapeutic Activity: 23-37  mins  Waldron Session, OTR/L Acute Care Rehab Services  Office [phone] Pager: [phone]   Kelli Churn 10/31/2020, 11:58 AM

## 2020-10-31 NOTE — Plan of Care (Signed)
  Problem: Education: Goal: Knowledge of General Education information will improve Description: Including pain rating scale, medication(s)/side effects and non-pharmacologic comfort measures Outcome: Progressing   Problem: Health Behavior/Discharge Planning: Goal: Ability to manage health-related needs will improve Outcome: Progressing   Problem: Clinical Measurements: Goal: Ability to maintain clinical measurements within normal limits will improve Outcome: Progressing Goal: Will remain free from infection Outcome: Progressing Goal: Diagnostic test results will improve Outcome: Progressing Goal: Cardiovascular complication will be avoided Outcome: Progressing   Problem: Activity: Goal: Risk for activity intolerance will decrease Outcome: Progressing   Problem: Coping: Goal: Level of anxiety will decrease Outcome: Progressing   Problem: Elimination: Goal: Will not experience complications related to bowel motility Outcome: Progressing Goal: Will not experience complications related to urinary retention Outcome: Progressing   Problem: Pain Managment: Goal: General experience of comfort will improve Outcome: Progressing   Problem: Safety: Goal: Ability to remain free from injury will improve Outcome: Progressing   Problem: Skin Integrity: Goal: Risk for impaired skin integrity will decrease Outcome: Progressing   

## 2020-10-31 NOTE — Progress Notes (Addendum)
Inpatient Rehab Admissions Coordinator:   Following consultation with PM&R physician, it was decided that Pt. Does not demonstrate need for CIR level therapies at this time, as he appears to be pain limited but ambulating min guard to min A. Recommend acute therapies addressing stair training prior to d/c and d/c home with [facility] PT/OT. I have notified the patient and CIR will be signing off.  Please consult me with any questions.   Megan Salon, MS, CCC-SLP Rehab Admissions Coordinator  [phone] (celll) [phone] (office)

## 2020-10-31 NOTE — Progress Notes (Signed)
Patient was snoring when he was woken up to take his scheduled Oxycodone 10 mg and Toradol IV. Patient refused to  take both medications and demanded for Dilaudid. He endorsed that the  toradol burns his arm when  pushing through his IV but the dilaudid doesn't and as such he prefers the dilaudid. Multiple attempts made by this RN and IV team to move the IV to another site but patient refused. There's nothing wrong with the IV and he's just using it as an excuse  to get the Dilaudid. Patient is exhibiting drug seeking behavior.. I explained to him to take both medications and I'll be back in an hour to reassess and if it doesn't work, he'll receive the dilaudid. Patient stated, "either you give me the dilaudid or not .". He became agitated and started using profane words. Both medications were wasted.

## 2020-10-31 NOTE — Progress Notes (Signed)
Orthopedic Tech Progress Note Patient Details:  Tyler [name] 1986/03/31 [id number]  Ortho Devices Type of Ortho Device: Crutches Ortho Device/Splint Interventions: Ordered,Adjustment   Post Interventions Patient Tolerated: Well Instructions Provided: Care of device   Jennye Moccasin 10/31/2020, 3:10 PM

## 2020-10-31 NOTE — Progress Notes (Signed)
After returning both scheduled medication, patient called me to bring the medications  back. He accepted them. He added the staff should not wake Ihim up to give any pain medication unless it's dilaudid. I told patient I'll pass the information on  to the incoming RN.

## 2020-11-01 LAB — CREATININE, SERUM
Creatinine, Ser: 0.93 mg/dL (ref 0.61–1.24)
GFR, Estimated: >60 mL/min (ref >60)

## 2020-11-01 MED ORDER — AMOXICILLIN-POT CLAVULANATE 875-125 MG PO TABS: 1.0000 | ORAL_TABLET | Freq: Two times a day (BID) | ORAL | 0 refills | Status: AC

## 2020-11-01 MED ORDER — OXYCODONE HCL 5 MG PO TABS
5.0000 mg | ORAL_TABLET | ORAL | Status: DC | PRN
  Administered 2020-11-01: 10 mg via ORAL
  Filled 2020-11-01: qty 2

## 2020-11-01 MED ORDER — OXYCODONE HCL 5 MG PO TABS: 5.0000 mg | ORAL_TABLET | ORAL | 0 refills | Status: DC | PRN

## 2020-11-01 NOTE — Discharge Summary (Signed)
Physician Discharge Summary  Tyler [name] JSH:[id number] DOB: [birth year] DOA: 10/20/2020  PCP: Patient, No Pcp Per  Admit date: 10/20/2020 Discharge date: 11/01/2020  Admitted From: Home Disposition:  Home  Recommendations for Outpatient Follow-up:  1. Follow up with general surgery as scheduled  Discharge Condition: Stable CODE STATUS: Full  Diet recommendation:  Diet Orders (From admission, onward)    Start     Ordered   10/28/20 1604  Diet regular Room service appropriate? Yes; Fluid consistency: Thin  Diet effective now       Question Answer Comment  Room service appropriate? Yes   Fluid consistency: Thin      10/28/20 1603         Brief/Interim Summary: [name] a 35 y.o.year old malewith medical history significant for recurrent prostate abscess (he has had 2 prior admissions: 10/4-10/8 with unsuccessful transurethral moving followed by transrectal drain placement with wound cultures growing strep angina gnosis, followed by hospitalization 10/19-10/25 for sepsis due to perineal/right scrotal abscess requiring I&D). During last hospitalization patient underwent cystourethroscopy and I&D of abscess on 10/19, completed a week of empiric IV vancomycin, cefepime and Flagyl, blood cultures remain unremarkable at that time and abscess culture showed mixed anaerobic flora and was discharged 2 to 3 weeks of oral Augmentin.  Patient presented on 1/4 with recurrent episode of peroneal discomfort starting since Christmas, pain radiating to right testicle with associated nausea, chills and night sweats and fever of 103 at home.  He was admitted for sepsis secondary to recurrent multiloculated perirectal abscesses, given his history of polymicrobial abscesses he was started on empiric vancomycin and Zosyn and underwent operative I&D of perineal abscess by urology as well as transurethral resection of the prostate abscess on 1/4.  Repeat CT on 1/11 showed new left perianal phlegmon.   General surgery was consulted.  Patient underwent I&D of perianal abscess on 1/12. He was deescalated to unasyn then subsequently to augmentin. He was weaned off IV dilaudid. On day of discharge, patient was able to ambulate with crutches, felt his pain was better controlled, and really wanted to be discharged home.   Discharge Diagnoses:  Principal Problem:   Sepsis [facility]) Active Problems:   Perineal abscess   Tobacco abuse   Sepsis secondary to recurrent peri-prostatic and perineal abscess status post TURP and I&D of perineal abscess on 1/4, I&D of perianal abscess on 1/12 -Intraoperative wound culture positive for Fusobacterium, Bacteroides -Appreciate urology, general surgery -Follow-up with general surgery as scheduled on 11/12/2020.  Penrose drain in place on discharge. -Unasyn --> Augmentin, last dose 1/21  Tobacco use -Nicotine patch, cessation counseling   Discharge Instructions  Discharge Instructions    Call MD for:  difficulty breathing, headache or visual disturbances   Complete by: As directed    Call MD for:  extreme fatigue   Complete by: As directed    Call MD for:  hives   Complete by: As directed    Call MD for:  persistant dizziness or light-headedness   Complete by: As directed    Call MD for:  persistant nausea and vomiting   Complete by: As directed    Call MD for:  redness, tenderness, or signs of infection (pain, swelling, redness, odor or green/yellow discharge around incision site)   Complete by: As directed    Call MD for:  severe uncontrolled pain   Complete by: As directed    Call MD for:  temperature >100.4   Complete by: As directed  Discharge instructions   Complete by: As directed    You were cared for by a hospitalist during your hospital stay. If you have any questions about your discharge medications or the care you received while you were in the hospital after you are discharged, you can call the unit and ask to speak with the  hospitalist on call if the hospitalist that took care of you is not available. Once you are discharged, your primary care physician will handle any further medical issues. Please note that NO REFILLS for any discharge medications will be authorized once you are discharged, as it is imperative that you return to your primary care physician (or establish a relationship with a primary care physician if you do not have one) for your aftercare needs so that they can reassess your need for medications and monitor your lab values.   Increase activity slowly   Complete by: As directed    No wound care   Complete by: As directed      Allergies as of 11/01/2020   No Known Allergies     Medication List    STOP taking these medications   acetaminophen 325 MG tablet Commonly known as: TYLENOL     TAKE these medications   amoxicillin-clavulanate 875-125 MG tablet Commonly known as: AUGMENTIN Take 1 tablet by mouth every 12 (twelve) hours for 5 days.   oxyCODONE 5 MG immediate release tablet Commonly known as: Oxy IR/ROXICODONE Take 1-2 tablets (5-10 mg total) by mouth every 4 (four) hours as needed for severe pain, breakthrough pain or moderate pain. What changed:   how much to take  when to take this  reasons to take this   polyethylene glycol 17 g packet Commonly known as: MIRALAX / GLYCOLAX Take 17 g by mouth 2 (two) times daily.   senna 8.6 MG Tabs tablet Commonly known as: SENOKOT Take 2 tablets (17.2 mg total) by mouth daily as needed for mild constipation or moderate constipation.       Follow-up Information    Sur[facility], [facility]. Go on 11/12/2020.   Specialty: General Surgery Why: at 845am. Please arrive 30 minutes prior to your appointment for paperwork. Please bring a copy of your photo ID and insurance card.  Contact information: [address] [place] [address] [phone]        Sebastian Ache, MD Follow up.   Specialty: Urology Contact  information: [address] [place] [address] [phone]              No Known Allergies  Consultations:  Urology  General surgery    Procedures/Studies: CT ABDOMEN PELVIS W CONTRAST  Result Date: 10/27/2020 CLINICAL DATA:  Post I and D of perineal/rectal abscess. Perineal pain. EXAM: CT ABDOMEN AND PELVIS WITH CONTRAST TECHNIQUE: Multidetector CT imaging of the abdomen and pelvis was performed using the standard protocol following bolus administration of intravenous contrast. CONTRAST:  OMNIPAQUE IOHEXOL 300 MG/ML  SOLN COMPARISON:  10/20/2020 FINDINGS: Lower chest: Bibasilar subsegmental atelectasis. Normal heart size without pericardial or pleural effusion. Hepatobiliary: Normal liver. Normal gallbladder, without biliary ductal dilatation. Pancreas: Normal, without mass or ductal dilatation. Spleen: Normal in size, without focal abnormality. Adrenals/Urinary Tract: Normal adrenal glands. Normal kidneys, without hydronephrosis. Normal urinary bladder. Stomach/Bowel: Normal stomach, without wall thickening. Normal colon and terminal ileum. Normal small bowel. Vascular/Lymphatic: Normal caliber of the aorta and branch vessels. No abdominal adenopathy. Right inguinal node of 11 mm on 93/2 is similar to the  12 mm on the prior, favored to be reactive. Reproductive: Significantly improved appearance of the prostate. A drain is identified about the apex of the prostate. Trace fluid about the pelvic floor, just caudal to the apex of the prostate. Example 11 mm on 97/2. At the site of a 4.4 x 3.9 cm collection on the prior exam. Equivocal hypoattenuation just cephalad to this, in the region of the prostatic apex including on 94/2 is likely postoperative. Other: Soft tissue fullness within the ischioanal fossa. A left perianal collection is favored to represent phlegmon, measuring greater than fluid density centrally. Example 4.2 x 2.6 cm on 103/2. This is posterior and caudal to a smaller  abscess on the prior exam. Right sided perianal fluid collection has resolved or been drained. Musculoskeletal: No acute osseous abnormality. IMPRESSION: 1. Since 10/20/2020, surgical debridement of periprosthetic and perianal abscesses. Trace residual periprostatic fluid with areas of subtle hypoattenuation which are likely postoperative. 2. Primarily resolved/drained perianal abscesses. A new or increased left perianal low-density collection is favored to represent phlegmon. Electronically Signed   By: Jeronimo GreavesKyle  Talbot M.D.   On: 10/27/2020 08:59   CT ABDOMEN PELVIS W CONTRAST  Result Date: 10/20/2020 CLINICAL DATA:  Rectal pain, rectal abscess, leukocytosis EXAM: CT ABDOMEN AND PELVIS WITH CONTRAST TECHNIQUE: Multidetector CT imaging of the abdomen and pelvis was performed using the standard protocol following bolus administration of intravenous contrast. CONTRAST:  100mL OMNIPAQUE IOHEXOL 300 MG/ML  SOLN COMPARISON:  07/20/2020, 07/26/2020 FINDINGS: Lower chest: The visualized lung bases are clear bilaterally. The visualized heart and pericardium are unremarkable. Hepatobiliary: No focal liver abnormality is seen. No gallstones, gallbladder wall thickening, or biliary dilatation. Pancreas: Unremarkable Spleen: Unremarkable Adrenals/Urinary Tract: The adrenal glands are unremarkable. The kidneys are normal. The bladder is largely decompressed. Previously noted bladder wall thickening has improved in the interval. There is a defect seen at the bladder base involving the prostatic urethra in keeping with the given history of trans urethral resection. Stomach/Bowel: The stomach, small bowel, and large bowel are unremarkable. No free intraperitoneal gas or fluid. Vascular/Lymphatic: There is shotty bilateral external iliac lymphadenopathy, likely reactive in nature. No frankly pathologic adenopathy within the abdomen and pelvis. The vascular structures are unremarkable. Reproductive: As noted above, a defect is seen  within the prostate gland in keeping with the given history of trans urethral resection. Other: Just inferior to the prostate gland, surrounding the expected membranous urethra is a recurrent perirectal abscess measuring 3.8 x 4.4 cm at axial image # 80/2. This component above the levator ani appears separate from 2 additional components involving the crus of the corpus cavernosa bilaterally. On the right, this measures 2.0 x 6.6 cm on axial image # 87/2. On the left, this multiloculated collection is less well-defined measuring roughly 1.5 x 4.1 cm at axial image # 86/2. Musculoskeletal: No acute bone abnormality. IMPRESSION: Recurrent multiloculated perirectal abscesses demonstrating components both above the levator ani as well as below the levator involving the crus of the corpus cavernosa bilaterally. Interval changes of trans urethral resection of the prostate with improved bladder wall thickening. Electronically Signed   By: Helyn NumbersAshesh  Parikh MD   On: 10/20/2020 06:46       Discharge Exam: Vitals:   10/31/20 2116 11/01/20 0530  BP: 130/61 110/69  Pulse: 81 67  Resp: 18 18  Temp: 98.3 F (36.8 C) 98.2 F (36.8 C)  SpO2: 99% 99%    General: Pt is alert, awake, not in acute distress Psych: Normal mood and  affect, stable judgement and insight     The results of significant diagnostics from this hospitalization (including imaging, microbiology, ancillary and laboratory) are listed below for reference.     Microbiology: No results found for this or any previous visit (from the past 240 hour(s)).   Labs: BNP (last 3 results) No results for input(s): BNP in the last 8760 hours. Basic Metabolic Panel: Recent Labs  Lab 10/27/20 0545 10/28/20 0557 10/29/20 0605 10/30/20 0601 10/31/20 0738 11/01/20 0522  NA 136 137 136  --   --   --   K 4.2 4.2 3.9  --   --   --   CL 98 100 100  --   --   --   CO2 28 26 26   --   --   --   GLUCOSE 92 92 99  --   --   --   BUN 15 13 14   --   --    --   CREATININE 1.01 0.89 0.98 0.84 0.89 0.93  CALCIUM 9.4 9.2 9.0  --   --   --    Liver Function Tests: No results for input(s): AST, ALT, ALKPHOS, BILITOT, PROT, ALBUMIN in the last 168 hours. No results for input(s): LIPASE, AMYLASE in the last 168 hours. No results for input(s): AMMONIA in the last 168 hours. CBC: Recent Labs  Lab 10/26/20 0958 10/27/20 0545 10/28/20 0557 10/29/20 0605  WBC 7.4 6.6 5.5 8.9  NEUTROABS  --   --  2.8  --   HGB 13.3 14.0 13.2 12.3*  HCT 41.0 42.3 41.2 38.8*  MCV 81.7 80.7 82.6 84.2  PLT 529* 558* 545* 555*   Cardiac Enzymes: No results for input(s): CKTOTAL, CKMB, CKMBINDEX, TROPONINI in the last 168 hours. BNP: Invalid input(s): POCBNP CBG: No results for input(s): GLUCAP in the last 168 hours. D-Dimer No results for input(s): DDIMER in the last 72 hours. Hgb A1c No results for input(s): HGBA1C in the last 72 hours. Lipid Profile No results for input(s): CHOL, HDL, LDLCALC, TRIG, CHOLHDL, LDLDIRECT in the last 72 hours. Thyroid function studies No results for input(s): TSH, T4TOTAL, T3FREE, THYROIDAB in the last 72 hours.  Invalid input(s): FREET3 Anemia work up No results for input(s): VITAMINB12, FOLATE, FERRITIN, TIBC, IRON, RETICCTPCT in the last 72 hours. Urinalysis    Component Value Date/Time   COLORURINE YELLOW 10/20/2020 0600   APPEARANCEUR CLOUDY (A) 10/20/2020 0600   LABSPEC >1.030 (H) 10/20/2020 0600   PHURINE 5.5 10/20/2020 0600   GLUCOSEU NEGATIVE 10/20/2020 0600   HGBUR MODERATE (A) 10/20/2020 0600   BILIRUBINUR NEGATIVE 10/20/2020 0600   KETONESUR NEGATIVE 10/20/2020 0600   PROTEINUR 100 (A) 10/20/2020 0600   NITRITE NEGATIVE 10/20/2020 0600   LEUKOCYTESUR MODERATE (A) 10/20/2020 0600   Sepsis Labs Invalid input(s): PROCALCITONIN,  WBC,  LACTICIDVEN Microbiology No results found for this or any previous visit (from the past 240 hour(s)).   Patient was seen and examined on the day of discharge and was  found to be in stable condition. Time coordinating discharge: 25 minutes including assessment and coordination of care, as well as examination of the patient.   SIGNED:  12/18/2020, DO [facility] 11/01/2020, 11:25 AM

## 2020-11-01 NOTE — Progress Notes (Addendum)
PROGRESS NOTE    Tyler [name]  WGY:[id number] DOB: 07-Apr-1986 DOA: 10/20/2020 PCP: Patient, No Pcp Per     Brief Narrative:  Tyler [name] is a 35 y.o. year old male with medical history significant for recurrent prostate abscess (he has had 2 prior admissions: 10/4-10/8 with unsuccessful transurethral moving followed by transrectal drain placement with wound cultures growing strep angina gnosis, followed by hospitalization 10/19-10/25 for sepsis due to perineal/right scrotal abscess requiring I&D).  During last hospitalization patient underwent cystourethroscopy and I&D of abscess on 10/19, completed a week of empiric IV vancomycin, cefepime and Flagyl, blood cultures remain unremarkable at that time and abscess culture showed mixed anaerobic flora and was discharged 2 to 3 weeks of oral Augmentin.  Patient presented on 1/4 with recurrent episode of peroneal discomfort starting since Christmas, pain radiating to right testicle with associated nausea, chills and night sweats and fever of 103 at home.  He was admitted for sepsis secondary to recurrent multiloculated perirectal abscesses, given his history of polymicrobial abscesses he was started on empiric vancomycin and Zosyn and underwent operative I&D of perineal abscess by urology as well as transurethral resection of the prostate abscess on 1/4.  Repeat CT on 1/11 showed new left perianal phlegmon.  General surgery was consulted.  Patient underwent I&D of perianal abscess on 1/12.  New events last 24 hours / Subjective: No acute events reported overnight.  Patient sleeping soundly this morning and appears to be very comfortable  Assessment & Plan:   Principal Problem:   Sepsis [facility]) Active Problems:   Perineal abscess   Tobacco abuse   Sepsis secondary to recurrent peri-prostatic and perineal abscess status post TURP and I&D of perineal abscess on 1/4, I&D of perianal abscess on 1/12 -Intraoperative wound culture positive for  Fusobacterium, Bacteroides -Appreciate urology, general surgery -Follow-up with general surgery as scheduled on 11/12/2020.  Penrose drain in place on discharge. -Unasyn --> Augmentin, last dose 1/21 -Wean off IV Dilaudid  Tobacco use -Nicotine patch, cessation counseling   DVT prophylaxis: SCDs enoxaparin (LOVENOX) injection 40 mg Start: 10/30/20 1000 Place and maintain sequential compression device Start: 10/28/20 1142  Code Status: Full code Family Communication: No family at bedside Disposition Plan:  Status is: Inpatient  Remains inpatient appropriate because:Ongoing active pain requiring inpatient pain management   Dispo: The patient is from: Home              Anticipated d/c is to: Home              Anticipated d/c date is: 1 day              Patient currently is not medically stable to d/c.  Wean off IV Dilaudid today.  Hopeful discharge home 1/17   Antimicrobials:  Anti-infectives (From admission, onward)   Start     Dose/Rate Route Frequency Ordered Stop   10/31/20 2200  amoxicillin-clavulanate (AUGMENTIN) 875-125 MG per tablet 1 tablet        1 tablet Oral Every 12 hours 10/31/20 1648     10/26/20 1400  Ampicillin-Sulbactam (UNASYN) 3 g in sodium chloride 0.9 % 100 mL IVPB  Status:  Discontinued        3 g 200 mL/hr over 30 Minutes Intravenous Every 6 hours 10/26/20 0840 10/31/20 1648   10/25/20 2200  vancomycin (VANCOREADY) IVPB 1250 mg/250 mL  Status:  Discontinued        1,250 mg 166.7 mL/hr over 90 Minutes Intravenous Every 12 hours 10/25/20 1225 10/26/20  0840   10/20/20 2200  vancomycin (VANCOREADY) IVPB 1500 mg/300 mL  Status:  Discontinued        1,500 mg 150 mL/hr over 120 Minutes Intravenous Every 12 hours 10/20/20 1017 10/25/20 1225   10/20/20 1800  vancomycin (VANCOREADY) IVPB 1500 mg/300 mL  Status:  Discontinued        1,500 mg 150 mL/hr over 120 Minutes Intravenous Every 12 hours 10/20/20 1015 10/20/20 1017   10/20/20 1400  piperacillin-tazobactam  (ZOSYN) IVPB 3.375 g  Status:  Discontinued        3.375 g 12.5 mL/hr over 240 Minutes Intravenous Every 8 hours 10/20/20 1015 10/26/20 0840   10/20/20 1200  vancomycin (VANCOREADY) IVPB 500 mg/100 mL        500 mg 100 mL/hr over 60 Minutes Intravenous  Once 10/20/20 1017 10/20/20 1620   10/20/20 0615  ceFEPIme (MAXIPIME) 2 g in sodium chloride 0.9 % 100 mL IVPB        2 g 200 mL/hr over 30 Minutes Intravenous STAT 10/20/20 0609 10/20/20 0936   10/20/20 0615  vancomycin (VANCOREADY) IVPB 1500 mg/300 mL        1,500 mg 150 mL/hr over 120 Minutes Intravenous STAT 10/20/20 0610 10/20/20 0815   10/20/20 0600  metroNIDAZOLE (FLAGYL) IVPB 500 mg        500 mg 100 mL/hr over 60 Minutes Intravenous  Once 10/20/20 0551 10/20/20 1035       Objective: Vitals:   10/31/20 1300 10/31/20 2116 11/01/20 0500 11/01/20 0530  BP: 118/76 130/61  110/69  Pulse: 65 81  67  Resp:  18  18  Temp: 97.6 F (36.4 C) 98.3 F (36.8 C)  98.2 F (36.8 C)  TempSrc: Oral     SpO2: 100% 99%  99%  Weight:   83.7 kg   Height:        Intake/Output Summary (Last 24 hours) at 11/01/2020 0919 Last data filed at 11/01/2020 0600 Gross per 24 hour  Intake 822.79 ml  Output 1100 ml  Net -277.21 ml   Filed Weights   10/27/20 0516 10/29/20 0530 11/01/20 0500  Weight: 82.6 kg 84.2 kg 83.7 kg    Examination: General exam: Appears calm and comfortable    Data Reviewed: I have personally reviewed following labs and imaging studies  CBC: Recent Labs  Lab 10/26/20 0958 10/27/20 0545 10/28/20 0557 10/29/20 0605  WBC 7.4 6.6 5.5 8.9  NEUTROABS  --   --  2.8  --   HGB 13.3 14.0 13.2 12.3*  HCT 41.0 42.3 41.2 38.8*  MCV 81.7 80.7 82.6 84.2  PLT 529* 558* 545* 555*   Basic Metabolic Panel: Recent Labs  Lab 10/27/20 0545 10/28/20 0557 10/29/20 0605 10/30/20 0601 10/31/20 0738 11/01/20 0522  NA 136 137 136  --   --   --   K 4.2 4.2 3.9  --   --   --   CL 98 100 100  --   --   --   CO2 28 26 26   --    --   --   GLUCOSE 92 92 99  --   --   --   BUN 15 13 14   --   --   --   CREATININE 1.01 0.89 0.98 0.84 0.89 0.93  CALCIUM 9.4 9.2 9.0  --   --   --    GFR: Estimated Creatinine Clearance: 125.2 mL/min (by C-G formula based on SCr of 0.93 mg/dL). Liver Function Tests:  No results for input(s): AST, ALT, ALKPHOS, BILITOT, PROT, ALBUMIN in the last 168 hours. No results for input(s): LIPASE, AMYLASE in the last 168 hours. No results for input(s): AMMONIA in the last 168 hours. Coagulation Profile: No results for input(s): INR, PROTIME in the last 168 hours. Cardiac Enzymes: No results for input(s): CKTOTAL, CKMB, CKMBINDEX, TROPONINI in the last 168 hours. BNP (last 3 results) No results for input(s): PROBNP in the last 8760 hours. HbA1C: No results for input(s): HGBA1C in the last 72 hours. CBG: No results for input(s): GLUCAP in the last 168 hours. Lipid Profile: No results for input(s): CHOL, HDL, LDLCALC, TRIG, CHOLHDL, LDLDIRECT in the last 72 hours. Thyroid Function Tests: No results for input(s): TSH, T4TOTAL, FREET4, T3FREE, THYROIDAB in the last 72 hours. Anemia Panel: No results for input(s): VITAMINB12, FOLATE, FERRITIN, TIBC, IRON, RETICCTPCT in the last 72 hours. Sepsis Labs: No results for input(s): PROCALCITON, LATICACIDVEN in the last 168 hours.  No results found for this or any previous visit (from the past 240 hour(s)).    Radiology Studies: No results found.    Scheduled Meds: . amoxicillin-clavulanate  1 tablet Oral Q12H  . enoxaparin (LOVENOX) injection  40 mg Subcutaneous Q24H  . ketorolac  10 mg Oral Q8H  . nicotine  21 mg Transdermal Daily  . polyethylene glycol  17 g Oral BID  . senna-docusate  2 tablet Oral BID   Continuous Infusions: . sodium chloride 10 mL/hr at 10/26/20 1400     LOS: 12 days      Time spent: 10 minutes   Noralee Stain, DO [facility] 11/01/2020, 9:19 AM   Available via Epic secure chat 7am-7pm After these  hours, please refer to coverage provider listed on [email or web address]

## 2020-11-01 NOTE — Progress Notes (Signed)
Pt discharged home today per Dr. Alvino Chapel. Pt's IV site D/C'd and WDL. Pt's VSS. Pt provided with home medication list, discharge instructions and prescriptions. Verbalized understanding. Pt provided with dressing change supplies for at home use. Pt left floor via WC in stable condition accompanied by NT.

## 2020-11-01 NOTE — Progress Notes (Signed)
Occupational Therapy Treatment Patient Details Name: Tyler [name] MRN: [id number] DOB: [birth year] Today's Date: 11/01/2020    History of present illness Patient is 35 y.o. male presenting on 10/20/20 for recurrent episode of peroneal discomfort starting since Christmas, pain radiating to right testicle with associated nausea, chills and night sweats and fever. He has a history of recurrent multiloculated perirectal abscesses and is now s/p TURP on 10/20/20 with I&D on 10/28/20 due to ongoing abscess and phlegmon in his perianal area decision.   OT comments  Patient provided with LHR and sock adie and patient demonstrated use and verbalized understanding of use. Patient reports performing toileting with modified independence. Patient demonstrated ability to perform bed mobility with increased time and safe use of crutches with long ambulation. Patient has met OT goals. Patient reporting wanting to go home. Patient okay to dc home from OT stand point.    Follow Up Recommendations  No OT follow up    Equipment Recommendations  Other (comment);Tub/shower seat    Recommendations for Other Services      Precautions / Restrictions Precautions Precautions: None Precaution Comments: penrose drain Restrictions Weight Bearing Restrictions: No       Mobility Bed Mobility Overal bed mobility: Modified Independent             General bed mobility comments: Increased time to perform bed mobility secondary to pain but no physical assistance needed.  Transfers Overall transfer level: Modified independent               General transfer comment: Ambulated in hallway approx 350 feet with crutches. Supervision during task. Patient able to walk length of room without crutches in place but bearing down through them like a cane.    Balance Overall balance assessment: Modified Independent                                         ADL either performed or assessed with clinical  judgement   ADL                         Lower Body Dressing Details (indicate cue type and reason): Instructed on use of LHR and sock aide for LB dressing. Patient demonstrated use of sock aide and verbalized understanding of LHR after demonstration. Toilet Transfer: Modified Independent;Regular Toilet;Grab bars Armed forces technical officer Details (indicate cue type and reason): Patient reports getting off toilet and returning to bed without assistance. Toileting- Clothing Manipulation and Hygiene: Modified independent Toileting - Clothing Manipulation Details (indicate cue type and reason): Reports performing toileting mod I yesterday after BM.             Vision Baseline Vision/History: No visual deficits     Perception     Praxis      Cognition Arousal/Alertness: Awake/alert Behavior During Therapy: WFL for tasks assessed/performed Overall Cognitive Status: Within Functional Limits for tasks assessed                                          Exercises     Shoulder Instructions       General Comments      Pertinent Vitals/ Pain       Pain Assessment: Faces Faces Pain Scale: Hurts little more Pain Location: Lt LE and peri  region with movement Pain Descriptors / Indicators: Grimacing;Guarding Pain Intervention(s): Limited activity within patient's tolerance;Premedicated before session  Home Living                                          Prior Functioning/Environment              Frequency           Progress Toward Goals  OT Goals(current goals can now be found in the care plan section)  Progress towards OT goals: Goals met and updated - see care plan     Plan All goals met and education completed, patient discharged from OT services    Co-evaluation                 AM-PAC OT "6 Clicks" Daily Activity     Outcome Measure   Help from another person eating meals?: None Help from another person taking care  of personal grooming?: None Help from another person toileting, which includes using toliet, bedpan, or urinal?: None Help from another person bathing (including washing, rinsing, drying)?: None Help from another person to put on and taking off regular upper body clothing?: None Help from another person to put on and taking off regular lower body clothing?: None 6 Click Score: 24    End of Session Equipment Utilized During Treatment: Other (comment) (crutches)  OT Visit Diagnosis: Pain   Activity Tolerance Patient tolerated treatment well   Patient Left in bed;with call bell/phone within reach;with bed alarm set   Nurse Communication Mobility status (Patient wants to DC today)        Time: 1031-1057 OT Time Calculation (min): 26 min  Charges: OT General Charges $OT Visit: 1 Visit OT Treatments $Self Care/Home Management : 8-22 mins $Therapeutic Activity: 8-22 mins  Derl Barrow, OTR/L [place]  Office [phone] Pager: [phone]    Lenward Chancellor 11/01/2020, 11:29 AM

## 2020-11-01 NOTE — Plan of Care (Signed)
  Problem: Education: Goal: Knowledge of General Education information will improve Description Including pain rating scale, medication(s)/side effects and non-pharmacologic comfort measures Outcome: Completed/Met   Problem: Health Behavior/Discharge Planning: Goal: Ability to manage health-related needs will improve Outcome: Completed/Met   Problem: Clinical Measurements: Goal: Ability to maintain clinical measurements within normal limits will improve Outcome: Completed/Met Goal: Will remain free from infection Outcome: Completed/Met Goal: Diagnostic test results will improve Outcome: Completed/Met Goal: Cardiovascular complication will be avoided Outcome: Completed/Met   Problem: Activity: Goal: Risk for activity intolerance will decrease Outcome: Completed/Met   Problem: Coping: Goal: Level of anxiety will decrease Outcome: Completed/Met   Problem: Elimination: Goal: Will not experience complications related to bowel motility Outcome: Completed/Met Goal: Will not experience complications related to urinary retention Outcome: Completed/Met   Problem: Pain Managment: Goal: General experience of comfort will improve Outcome: Completed/Met   Problem: Safety: Goal: Ability to remain free from injury will improve Outcome: Completed/Met   Problem: Skin Integrity: Goal: Risk for impaired skin integrity will decrease Outcome: Completed/Met   

## 2020-11-05 ENCOUNTER — Ambulatory Visit: Payer: Self-pay

## 2020-11-05 NOTE — Telephone Encounter (Signed)
Patient called and says he had surgery in the hospital and a drainage tube was place in my rectum. I went to the bathroom and it fell out. He says he has an upcoming appointment on 11/12/20 at [facility]. I advised to call the office and let them know. He verbalized understanding.  Reason for Disposition . [1] Caller has URGENT question AND [2] triager unable to answer question  Answer Assessment - Initial Assessment Questions 1. SYMPTOM: "What's the main symptom you're concerned about?" (e.g., pain, fever, vomiting)     Drainage tube fell out 2. ONSET: "When did this start?"     A little while ago 3. SURGERY: "What surgery was performed?"     Perineal abscess 4. DATE of SURGERY: "When was surgery performed?"      10/28/20 5. ANESTHESIA: " What type of anesthesia did you have?" (e.g., general, spinal, epidural, local)     N/A 6. PAIN: "Is there any pain?" If Yes, ask: "How bad is it?"  (Scale 1-10; or mild, moderate, severe)     N/A 7. FEVER: "Do you have a fever?" If Yes, ask: "What is your temperature, how was it measured, and when did it start?"     N/A 8. VOMITING: "Is there any vomiting?" If yes, ask: "How many times?"     N/A 9. BLEEDING: "Is there any bleeding?" If Yes, ask: "How much?" and "Where?"     N/A 10. OTHER SYMPTOMS: "Do you have any other symptoms?" (e.g., drainage from wound, painful urination, constipation)       No  Protocols used: POST-OP SYMPTOMS AND QUESTIONS-A-AH

## 2021-07-14 DIAGNOSIS — J40 Bronchitis, not specified as acute or chronic: Secondary | ICD-10-CM | POA: Insufficient documentation

## 2021-11-06 IMAGING — DX DG CHEST 1V PORT
1 series · 1 of 1 positions shown · non-contrast
Comparison: None.

CLINICAL DATA: Suspected Sepsis

Tachycardia, fatigue and loss of consciousness. Nausea and vomiting.
EXAM:
PORTABLE CHEST 1 VIEW

[chest ap]
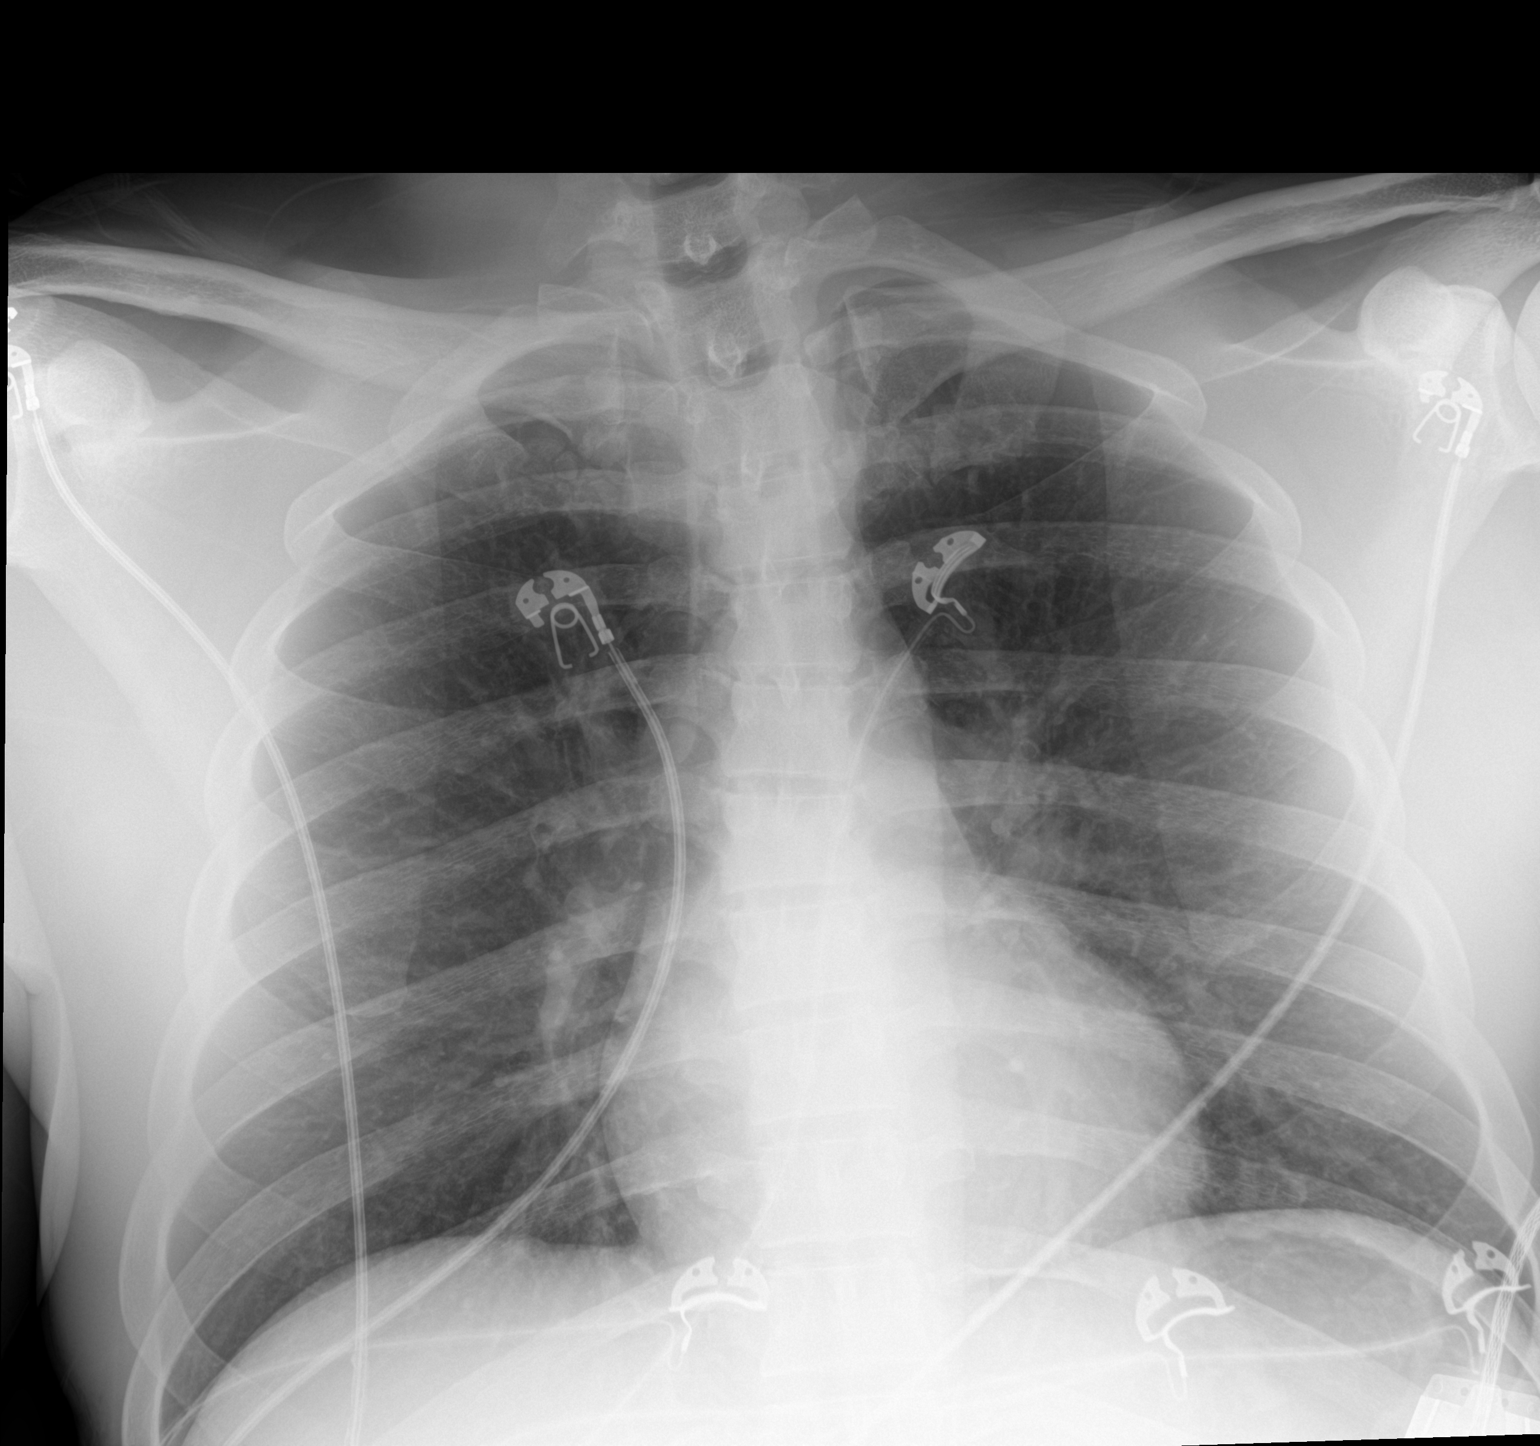

[1 of 1 positions shown; findings below may reference images not displayed]

FINDINGS: The cardiomediastinal contours are normal. The lungs are clear.
Pulmonary vasculature is normal. No consolidation, pleural effusion,
or pneumothorax. No acute osseous abnormalities are seen.
IMPRESSION: Negative portable AP view of the chest.

## 2021-11-07 IMAGING — US US BIOPSY FNA W/ IMAGING
1 series · 12 of 17 positions shown · non-contrast
Comparison: none

INDICATION: 34-year-old male with prostatic abscess and sepsis

[Series 1: us biopsy fna w/ imaging · 12 of 17 slices shown]
[im 1/17]
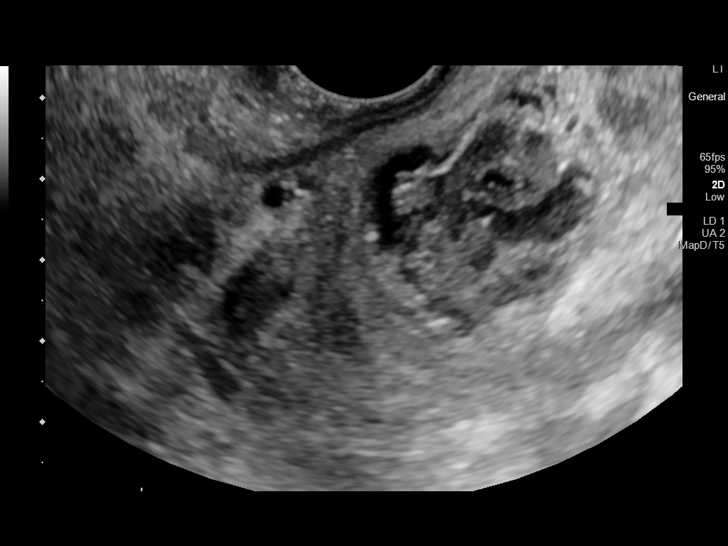
[im 3/17]
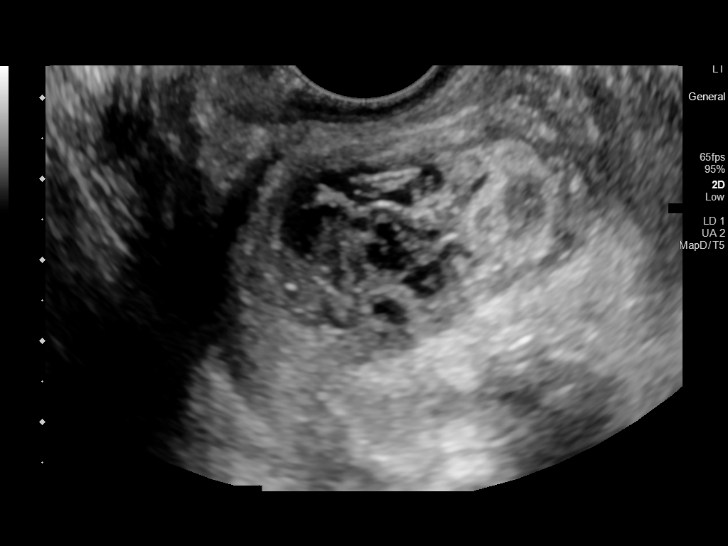
[im 4/17]
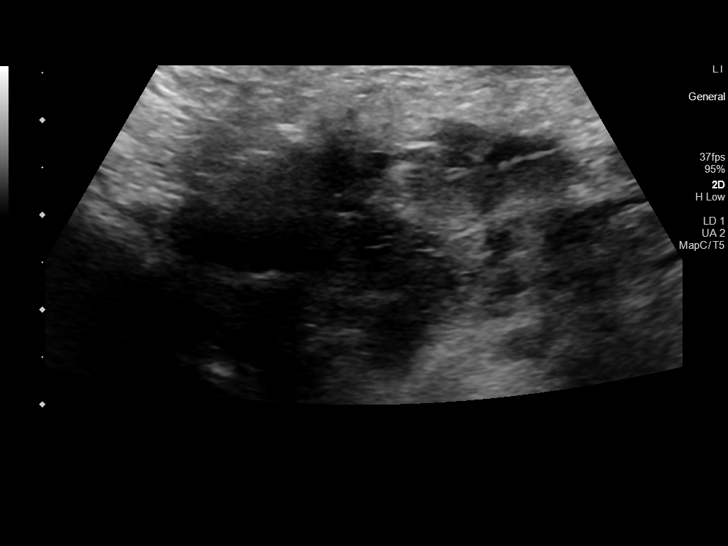
[im 5/17]
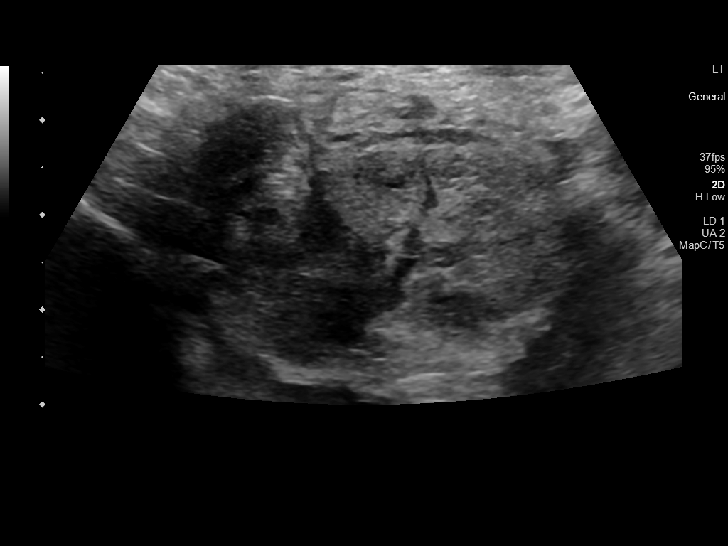
[im 7/17]
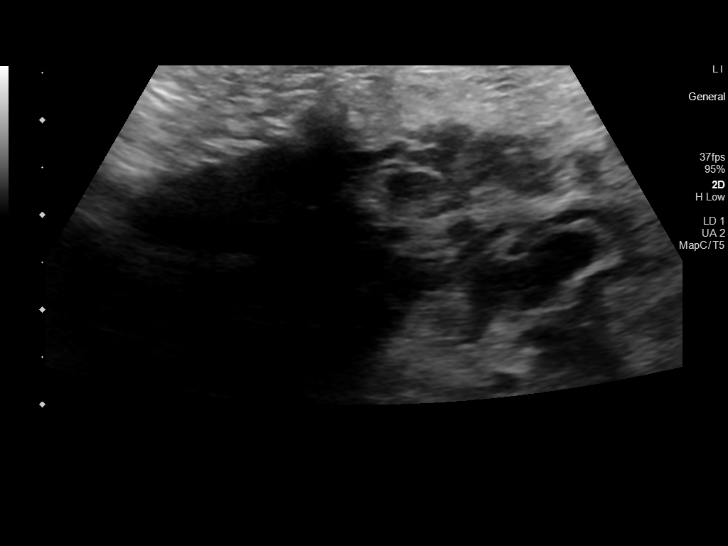
[im 8/17]
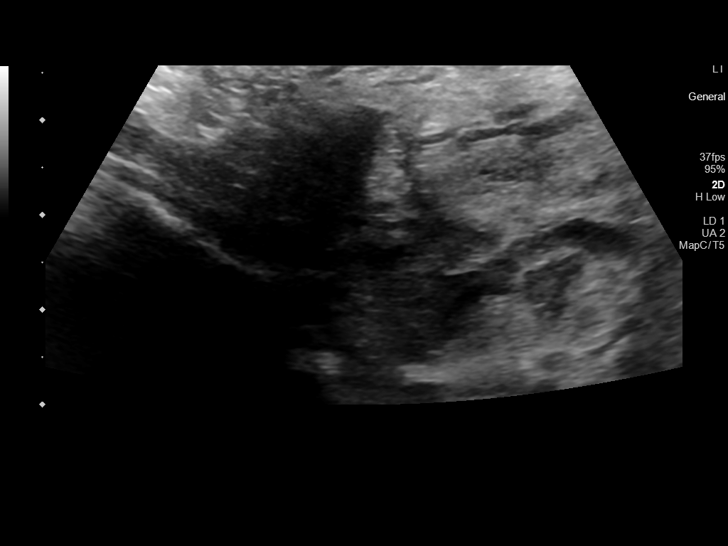
[im 10/17]
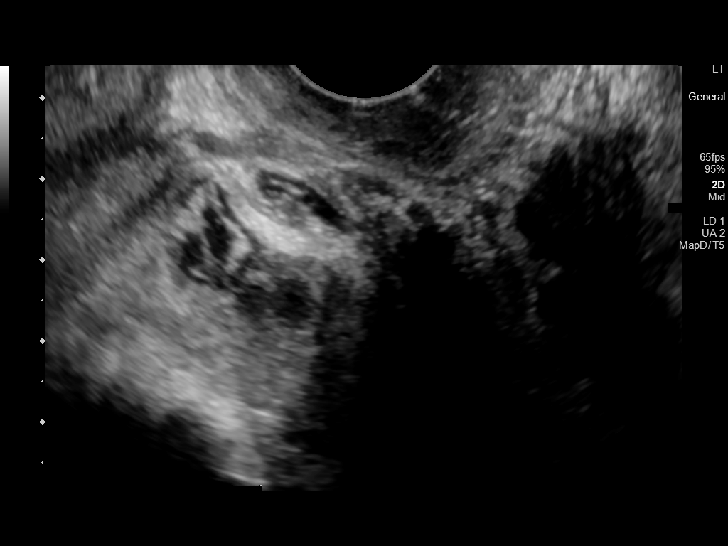
[im 11/17]
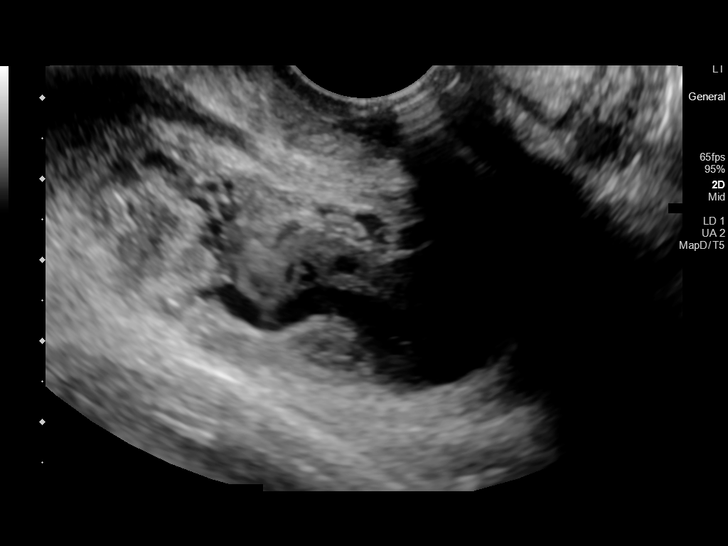
[im 13/17]
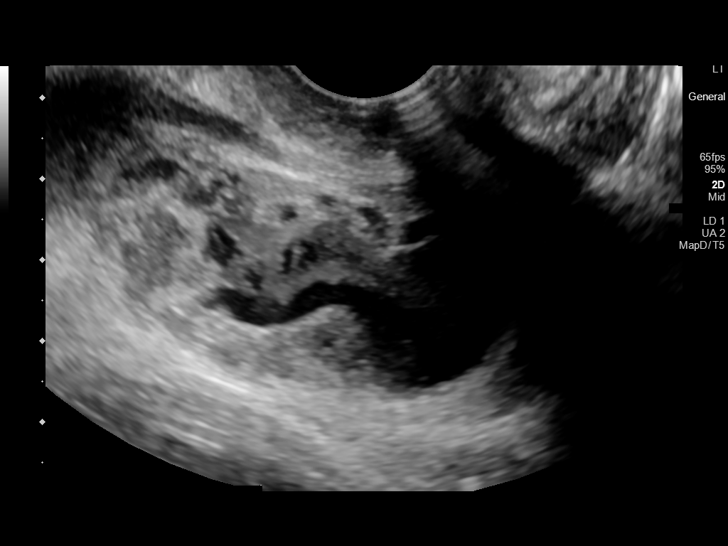
[im 14/17]
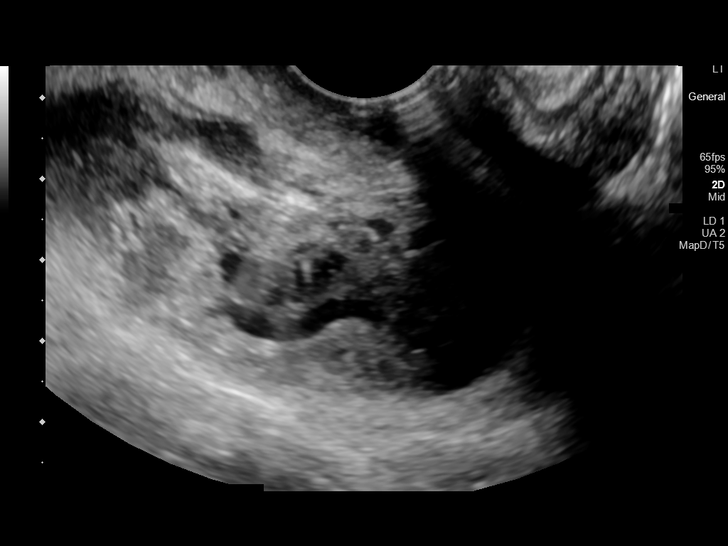
[im 15/17]
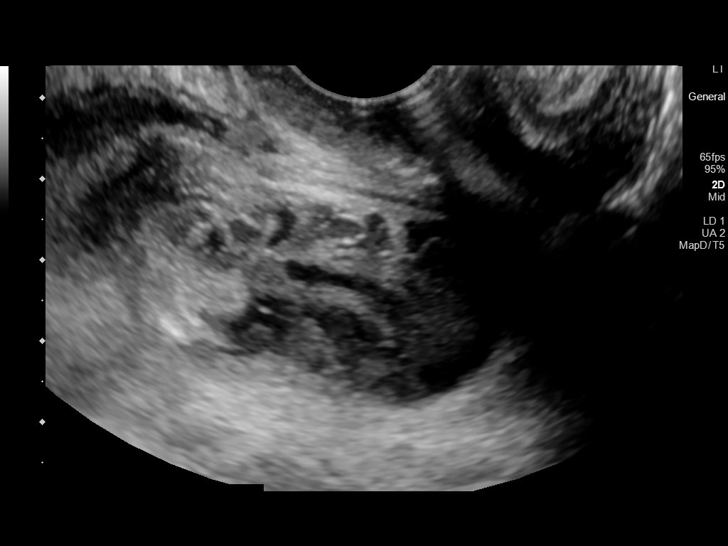
[im 17/17]
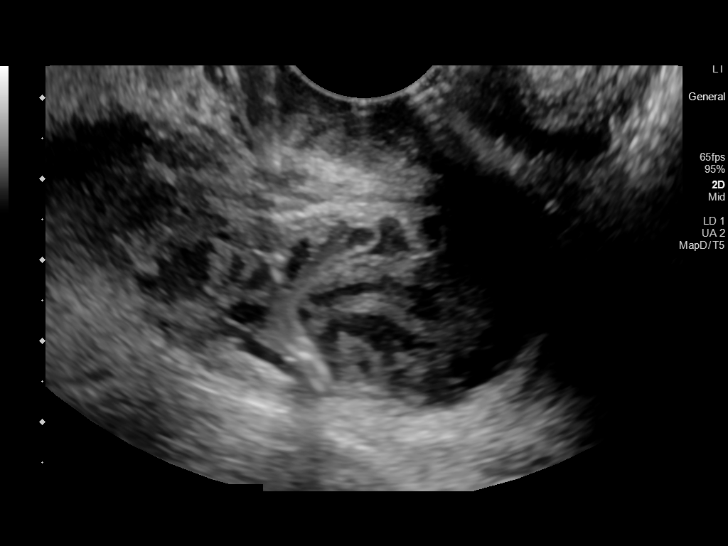

[12 of 17 positions shown; findings below may reference images not displayed]

EXAM:
Ultrasound-guided transrectal drain placement

MEDICATIONS:
The patient is currently admitted to the hospital and receiving
intravenous antibiotics. The antibiotics were administered within an
appropriate time frame prior to the initiation of the procedure.

ANESTHESIA/SEDATION:
Fentanyl 50 mcg IV; Versed 5 mg IV; 50 mg Benadryl

Moderate Sedation Time:  10 minutes

The patient was continuously monitored during the procedure by the
interventional radiology nurse under my direct supervision.

COMPLICATIONS:
SIR LEVEL B - Normal therapy, includes overnight admission for
observation.

With an approximately 30 minutes of the procedure, after the patient
had returned to the floor, he developed significant fever to 105
degrees, rigors and hypotension consistent with acute bacteremia.
This was treated with intravenous Demerol, IV fluids and care was
transition to the step-down unit.

PROCEDURE:
Informed written consent was obtained from the patient after a
thorough discussion of the procedural risks, benefits and
alternatives. All questions were addressed. Maximal Sterile Barrier
Technique was utilized including caps, mask, sterile gowns, sterile
gloves, sterile drape, hand hygiene and skin antiseptic. A timeout
was performed prior to the initiation of the procedure.

The patient was placed in the Sims' position. After adequate
sedation was achieved, initial ultrasound imaging was performed
trans rectally utilizing a transvaginal probe. Anterior to the
rectum, there is an ill-defined highly complex fluid collection
within and partially encircling the posterior aspect of the prostate
gland. Fluid tracks into the base of the penis as seen on prior CT
imaging. Visualization of the abscess collection was quite good via
transrectal approach.

Next, trans peritoneal imaging was also performed using the 9 linear
probe. Transperinealy, there appears to be a longer and more
challenging course into the complex fluid collection that would
necessitate more crossing of prostatic parenchyma.

Therefore, the decision was made to proceed with transrectal
drainage catheter approach. A peel-away guide was fashioned in my
usual fashion (utilizing the outer plastic covering of the 8 French
drainage catheter, cutting out to length and creating a longitudinal
incision along the length of the tubing. This was then attached to
the transvaginal probe using several sterile rubber bands.
Transvaginal probe was again lubricated in inserted into the rectum.
The abscess was positioned directly in front of the trans rectal
probe. Local anesthesia was then achieved by injection of 1%
lidocaine utilizing a 15 cm 22 gauge Chiba needle and real-time
trans rectal ultrasound imaging. After the rectal wall was
sufficiently anesthetized, and 8.5 [place] Condessa drainage
catheter was used in trocar fashion and directed through the
peel-away guide, through the anterior rectal wall and into the
abscess cavity. This was visualized continuously by ultrasound. Once
the tip of the drainage catheter had penetrated the complex fluid
collection, the catheter was released from the metal stylet and
advanced into the fluid collection. The locking loop was then
formed. Aspiration yields approximately 30 mL of bloody,
foul-smelling purulent fluid. The catheter was secured to the
buttock utilizing an adhesive fixation device.

Overall, the patient tolerated the procedure well.
IMPRESSION: Successful placement of an 8.5 French drainage catheter via
transrectal approach into the prosthetic abscess yielding
approximately 30 mL of foul-smelling bloody purulent fluid.

PLAN:
1. Aspirated fluid sent for Gram stain and culture.
2. Maintain drain to JP bulb suction.
3. Flush gently with 5 mL sterile saline at least once per shift.
4. When drain output is scant and clear, the drainage catheter can
be removed.

## 2021-12-21 ENCOUNTER — Ambulatory Visit (HOSPITAL_COMMUNITY)
Admission: EM | Admit: 2021-12-21 | Discharge: 2021-12-22 | Disposition: A | Discharge status: Other Acute Inpatient Hospital | Payer: 59 | Attending: Psychiatry | Admitting: Psychiatry

## 2021-12-21 DIAGNOSIS — Z20822 Contact with and (suspected) exposure to covid-19: Secondary | ICD-10-CM | POA: Diagnosis not present

## 2021-12-21 DIAGNOSIS — F1290 Cannabis use, unspecified, uncomplicated: Secondary | ICD-10-CM | POA: Diagnosis not present

## 2021-12-21 DIAGNOSIS — F430 Acute stress reaction: Secondary | ICD-10-CM | POA: Diagnosis not present

## 2021-12-21 DIAGNOSIS — F1010 Alcohol abuse, uncomplicated: Secondary | ICD-10-CM | POA: Insufficient documentation

## 2021-12-21 DIAGNOSIS — F29 Unspecified psychosis not due to a substance or known physiological condition: Secondary | ICD-10-CM

## 2021-12-21 DIAGNOSIS — R45851 Suicidal ideations: Secondary | ICD-10-CM | POA: Diagnosis present

## 2021-12-21 DIAGNOSIS — F410 Panic disorder [episodic paroxysmal anxiety] without agoraphobia: Secondary | ICD-10-CM

## 2021-12-21 LAB — POCT URINE DRUG SCREEN - MANUAL ENTRY (I-SCREEN)
POC Amphetamine UR: NOT DETECTED
POC Buprenorphine (BUP): NOT DETECTED
POC Cocaine UR: NOT DETECTED
POC Marijuana UR: POSITIVE — AB
POC Methadone UR: NOT DETECTED
POC Methamphetamine UR: NOT DETECTED
POC Morphine: NOT DETECTED
POC Oxazepam (BZO): NOT DETECTED
POC Oxycodone UR: NOT DETECTED
POC Secobarbital (BAR): NOT DETECTED

## 2021-12-21 MED ORDER — ALUM & MAG HYDROXIDE-SIMETH 200-200-20 MG/5ML PO SUSP: 30.0000 mL | ORAL | Status: DC | PRN

## 2021-12-21 MED ORDER — MAGNESIUM HYDROXIDE 400 MG/5ML PO SUSP: 30.0000 mL | Freq: Every day | ORAL | Status: DC | PRN

## 2021-12-21 MED ORDER — ACETAMINOPHEN 325 MG PO TABS: 650.0000 mg | ORAL_TABLET | Freq: Four times a day (QID) | ORAL | Status: DC | PRN

## 2021-12-21 NOTE — BH Assessment (Signed)
Comprehensive Clinical Assessment (CCA) Note  12/22/2021 [name] [id number]  Disposition: Lindon Romp, NP, patient meets inpatient criteria. Disposition SW to secure placement.   Chief Complaint:  Chief Complaint  Patient presents with   Suicidal   Hallucinations   Visit Diagnosis:  Major depressive disorder Psychosis   CCA Screening, Triage and Referral (STR)  Patient Reported Information How did you hear about [place]? Self  What Is the Reason for Your Visit/Call Today? Tyler [name] is a 36 year old male presenting voluntary as a walk-in to [facility] due to [place] with plans to hang himself, cut himself, step into traffic or the train tracks. Patient reported "I have thoughts of killing myself every hour". Patient also reports command hallucinations to harm himself. Patient reports command hallucinations to harm himself. Patient reported hearing voices since the 2nd grade. Patient reported he was on psych medications up till age 50 when he went to prison. Patient reported stressor of separating from wife 2 weeks, patient reported attempting to hang himself and then stopping because his daughter walk-in. Patient reported ongoing grief loss issues, daughter died 40 years ago and grandmother died 1 year ago, which was 1 day before daughters birthday. Patient reported worsening depressive symptoms. Patient reported psych hospitalization at [facility] in 2021. Patient denied self-harming behaviors and SI. Patient currently resides with parents. Patient reported after being separated due to wife's concerns. Patient was calm and cooperative.  How Long Has This Been Causing You Problems? > than 6 months  What Do You Feel Would Help You the Most Today? Treatment for Depression or other mood problem   Have You Recently Had Any Thoughts About Hurting Yourself? Yes  Are You Planning to Commit Suicide/Harm Yourself At This time? Yes   Have you Recently Had Thoughts About Hurting Someone [name]?  No  Are You Planning to Harm Someone at This Time? No  Explanation: No data recorded  Have You Used Any Alcohol or Drugs in the Past 24 Hours? No  How Long Ago Did You Use Drugs or Alcohol? No data recorded What Did You Use and How Much? No data recorded  Do You Currently Have a Therapist/Psychiatrist? No  Name of Therapist/Psychiatrist: No data recorded  Have You Been Recently Discharged From Any Office Practice or Programs? No  Explanation of Discharge From Practice/Program: No data recorded    CCA Screening Triage Referral Assessment Type of Contact: Face-to-Face  Telemedicine Service Delivery:   Is this Initial or Reassessment? No data recorded Date Telepsych consult ordered in [facility]:  No data recorded Time Telepsych consult ordered in [facility]:  No data recorded Location of Assessment: [facility] Assessment Services  Provider Location: [facility] Assessment Services   Collateral Involvement: none reported   Does Patient Have a [place]? No data recorded Name and Contact of Legal Guardian: No data recorded If Minor and Not Living with Parent(s), Who has Custody? No data recorded Is CPS involved or ever been involved? Never  Is APS involved or ever been involved? Never   Patient Determined To Be At Risk for Harm To Self or Others Based on Review of Patient Reported Information or Presenting Complaint? No data recorded Method: No data recorded Availability of Means: No data recorded Intent: No data recorded Notification Required: No data recorded Additional Information for Danger to Others Potential: No data recorded Additional Comments for Danger to Others Potential: No data recorded Are There Guns or Other Weapons in Your Home? No data recorded Types of  Guns/Weapons: No data recorded Are These Weapons Safely Secured?                            No data recorded Who Could Verify You Are Able To Have These Secured: No data recorded Do You Have any  Outstanding Charges, Pending Court Dates, Parole/Probation? No data recorded Contacted To Inform of Risk of Harm To Self or Others: No data recorded   Does Patient Present under Involuntary Commitment? No  IVC Papers Initial File Date: No data recorded  [place] of Residence: [place]   Patient Currently Receiving the Following Services: Not Receiving Services   Determination of Need: Emergent (2 hours)   Options For Referral: Inpatient Hospitalization; Outpatient Therapy; Medication Management     CCA Biopsychosocial Patient Reported Schizophrenia/Schizoaffective Diagnosis in Past: No data recorded  Strengths: self-awareness   Mental Health Symptoms Depression:   Hopelessness; Fatigue; Difficulty Concentrating; Increase/decrease in appetite; Worthlessness; Tearfulness; Sleep (too much or little)   Duration of Depressive symptoms:  Duration of Depressive Symptoms: Greater than two weeks   Mania:   None   Anxiety:    Worrying; Tension; Restlessness; Sleep; Fatigue   Psychosis:   Hallucinations   Duration of Psychotic symptoms:  Duration of Psychotic Symptoms: Greater than six months   Trauma:   None   Obsessions:   None   Compulsions:   None   Inattention:   None   Hyperactivity/Impulsivity:   None   Oppositional/Defiant Behaviors:   None   Emotional Irregularity:   None   Other Mood/Personality Symptoms:  No data recorded   Mental Status Exam Appearance and self-care  Stature:   Average   Weight:   Average weight   Clothing:   Casual   Grooming:   Normal   Cosmetic use:   None   Posture/gait:   Normal   Motor activity:   Not Remarkable   Sensorium  Attention:   Normal   Concentration:   Normal   Orientation:   X5   Recall/memory:   Normal   Affect and Mood  Affect:   Depressed   Mood:   Depressed   Relating  Eye contact:   Normal   Facial expression:   Depressed   Attitude toward examiner:    Cooperative   Thought and Language  Speech flow:  Clear and Coherent   Thought content:   Appropriate to Mood and Circumstances   Preoccupation:   None   Hallucinations:   Command (Comment); Auditory   Organization:  No data recorded  [employer] of Knowledge:   Average   Intelligence:   Average   Abstraction:   Normal   Judgement:   Impaired   Reality Testing:  No data recorded  Insight:   Fair   Decision Making:  No data recorded  Social Functioning  Social Maturity:  No data recorded  Social Judgement:   "Street Smart"   Stress  Stressors:   Family conflict; Grief/losses; Relationship; Transitions   Coping Ability:   Overwhelmed; Exhausted   Skill Deficits:   Self-control; Decision making   Supports:   Family     Religion: Religion/Spirituality Are You A Religious Person?:  Special educational needs teacher)  Leisure/Recreation: Leisure / Recreation Do You Have Hobbies?: Yes Leisure and Hobbies: playing game  Exercise/Diet: Exercise/Diet Do You Exercise?:  (uta) Have You Gained or Lost A Significant Amount of Weight in the Past Six Months?:  (uta) Do You Follow  a Special Diet?:  (uta) Do You Have Any Trouble Sleeping?: Yes Explanation of Sleeping Difficulties: "only cat naps"   CCA Employment/Education Employment/Work Situation: Employment / Work Situation Has Patient ever Been in Passenger transport manager?: No  Education: Education Is Patient Currently Attending School?: No Last Grade Completed: 12 Did You Nutritional therapist?: No Did You Have An Individualized Education Program (IIEP):  (uta) Did You Have Any Difficulty At School?:  [employer] Badder) Patient's Education Has Been Impacted by Current Illness:  (uta)   CCA Family/Childhood History Family and Relationship History: Family history Marital status: Separated Number of Years Married:  (uta) Separated, when?: 2 weeks What types of issues is patient dealing with in the relationship?: mental  illness Additional relationship information: none Does patient have children?: Yes How many children?: 4 How is patient's relationship with their children?: good  Childhood History:  Childhood History By whom was/is the patient raised?: Mother Did patient suffer any verbal/emotional/physical/sexual abuse as a child?: No Did patient suffer from severe childhood neglect?: No Has patient ever been sexually abused/assaulted/raped as an adolescent or adult?: No Was the patient ever a victim of a crime or a disaster?: No  Child/Adolescent Assessment:     CCA Substance Use Alcohol/Drug Use: Alcohol / Drug Use Pain Medications: see MAR Prescriptions: see MAR Over the Counter: see MAR History of alcohol / drug use?: Yes Substance #1 Name of Substance 1: alcohol 1 - Age of First Use: 18 1 - Amount (size/oz): 2x 40's 1 - Frequency: daily 1 - Last Use / Amount: yesterday                       ASAM's:  Six Dimensions of Multidimensional Assessment  Dimension 1:  Acute Intoxication and/or Withdrawal Potential:      Dimension 2:  Biomedical Conditions and Complications:      Dimension 3:  Emotional, Behavioral, or Cognitive Conditions and Complications:     Dimension 4:  Readiness to Change:     Dimension 5:  Relapse, Continued use, or Continued Problem Potential:     Dimension 6:  Recovery/Living Environment:     ASAM Severity Score:    ASAM Recommended Level of Treatment:     Substance use Disorder (SUD)    Recommendations for Services/Supports/Treatments:    Discharge Disposition:    DSM5 Diagnoses: Patient Active Problem List   Diagnosis Date Noted   Tobacco abuse 10/20/2020   Perineal abscess 08/04/2020   Prostate abscess 07/21/2020   Acute respiratory failure with hypoxia ([place])    Sepsis ([place])    AKI (acute kidney injury) ([place]) 07/20/2020   Abscess of prostate 07/20/2020     Referrals to Alternative Service(s): Referred to Alternative Service(s):    Place:   Date:   Time:    Referred to Alternative Service(s):   Place:   Date:   Time:    Referred to Alternative Service(s):   Place:   Date:   Time:    Referred to Alternative Service(s):   Place:   Date:   Time:     Venora Maples, [facility]

## 2021-12-21 NOTE — ED Provider Notes (Signed)
Behavioral Health Admission H&P [facility] & OBS)  Date: 12/22/21 Patient Name: Tyler [name] MRN: [id number] Chief Complaint:  Chief Complaint  Patient presents with   Suicidal   Hallucinations      Diagnoses:  Final diagnoses:  Psychosis, unspecified psychosis type ([place])  Panic attack as reaction to stress  Alcohol abuse    HPI: 36 male, presented to [facility] with suicidal ideation,  and auditory Hallucination,  per the patient he hears voices telling him to kill himself every hour on the hour.  He also report suicidal ideation "hang self,  step into traffic".  Pt did report he has three different personality with [name] been the main one telling him "you deserve to burn in hell".    Pt report he has been drinking two 40's liquor daily,  last night was last drink he also report smoking marijuana daily.  Denies any other illicit drug used.    According to patient he was was taking psych medication up until age 36 year old when he went to prison.  He was also a patient at [facility],  pt report he had 7 panic attach today,  with symptoms of SOB, feeling numb and weakness.   Pt has prior history of suicidal Ideation which he try to hang himself,  but stated to stop when is daughter walked in on him.    Pt denies visual hallucination, report poor sleep,  decrease appetite,  increase depression.  Pt denies having access to guns.  Pt reported he was taking Seroquel 150 mg BID a few year ago.   Pt appearance was unkept, non-groomed,  poor eye contact.     PHQ 2-9:     Total Time spent with patient: 20 minutes  Musculoskeletal  Strength & Muscle Tone: within normal limits Gait & Station: normal Patient leans: N/A  Psychiatric Specialty Exam  Presentation General Appearance: No data recorded Eye Contact:No data recorded Speech:No data recorded Speech Volume:No data recorded Handedness:No data recorded  Mood and Affect  Mood:No data recorded Affect:No data recorded  Thought Process   Thought Processes:No data recorded Descriptions of Associations:No data recorded Orientation:No data recorded Thought Content:No data recorded   Hallucinations:No data recorded Ideas of Reference:No data recorded Suicidal Thoughts:No data recorded Homicidal Thoughts:No data recorded  Sensorium  Memory:No data recorded Judgment:No data recorded Insight:No data recorded  Executive Functions  Concentration:No data recorded Attention Span:No data recorded Recall:No data recorded Fund of Knowledge:No data recorded Language:No data recorded  Psychomotor Activity  Psychomotor Activity:No data recorded  Assets  Assets:No data recorded  Sleep  Sleep:No data recorded  No data recorded  Physical Exam Constitutional:      Appearance: Normal appearance.  HENT:     Head: Normocephalic.     Nose: Nose normal.  Eyes:     Pupils: Pupils are equal, round, and reactive to light.  Cardiovascular:     Rate and Rhythm: Normal rate.  Pulmonary:     Effort: Pulmonary effort is normal.  Musculoskeletal:        General: Normal range of motion.     Cervical back: Normal range of motion.  Neurological:     Mental Status: He is alert and oriented to person, place, and time.  Psychiatric:        Mood and Affect: Mood normal.        Behavior: Behavior normal.        Thought Content: Thought content normal.        Judgment: Judgment normal.  Review of Systems  Constitutional: Negative.   HENT: Negative.    Eyes: Negative.   Respiratory: Negative.    Cardiovascular: Negative.   Gastrointestinal: Negative.   Genitourinary: Negative.   Musculoskeletal: Negative.   Skin: Negative.   Neurological: Negative.   Endo/Heme/Allergies: Negative.   Psychiatric/Behavioral:  Positive for depression, hallucinations, substance abuse and suicidal ideas. The patient is nervous/anxious.    Blood pressure (!) 135/92, pulse 87, temperature 98.2 F (36.8 C), temperature source Oral, resp. rate 20,  SpO2 100 %. There is no height or weight on file to calculate BMI.  Past Psychiatric History: SI, Bipolar disorder    Is the patient at risk to self? Yes  Has the patient been a risk to self in the past 6 months? Yes .    Has the patient been a risk to self within the distant past? Yes   Is the patient a risk to others? No   Has the patient been a risk to others in the past 6 months? No   Has the patient been a risk to others within the distant past? No   Past Medical History:  Past Medical History:  Diagnosis Date   Anxiety    panic and SOB    Past Surgical History:  Procedure Laterality Date   IRRIGATION AND DEBRIDEMENT ABSCESS N/A 08/04/2020   Procedure: IRRIGATION AND DEBRIDEMENT PERINEAL ABSCESS, [place];  Surgeon: Irine Seal, MD;  Location: [facility];  Service: Urology;  Laterality: N/A;   IRRIGATION AND DEBRIDEMENT ABSCESS N/A 10/20/2020   Procedure: IRRIGATION AND DEBRIDEMENT PERINEAL  ABSCESS;  Surgeon: Alexis Frock, MD;  Location: [facility];  Service: Urology;  Laterality: N/A;   IRRIGATION AND DEBRIDEMENT BUTTOCKS N/A 10/28/2020   Procedure: IRRIGATION AND DEBRIDEMENT PERINEAL ABCESS;  Surgeon: Coralie Keens, MD;  Location: [facility];  Service: General;  Laterality: N/A;  packing 2" gauze   TRANSURETHRAL RESECTION OF PROSTATE  07/21/2020   Procedure: TRANSURETHRAL RESECTION OF THE PROSTATE (TURP);  Surgeon: Ceasar Mons, MD;  Location: [facility];  Service: Urology;;   TRANSURETHRAL RESECTION OF PROSTATE N/A 10/20/2020   Procedure: CYSTOSCOPY, TRANSURETHRAL RESECTION OF THE PROSTATE ABCESS(TURP);  Surgeon: Alexis Frock, MD;  Location: [facility];  Service: Urology;  Laterality: N/A;    Family History: No family history on file.  Social History:  Social History   Socioeconomic History   Marital status: Single    Spouse name: Not on file   Number of children: Not on file   Years of education: Not on file   Highest education level: Not on file   Occupational History   Not on file  Tobacco Use   Smoking status: Every Day    Packs/day: 1.00    Types: Cigarettes   Smokeless tobacco: Never  Vaping Use   Vaping Use: Never used  Substance and Sexual Activity   Alcohol use: Yes   Drug use: Yes    Types: Marijuana   Sexual activity: Not on file  Other Topics Concern   Not on file  Social History Narrative   Not on file   Social Determinants of Health   Financial Resource Strain: Not on file  Food Insecurity: Not on file  Transportation Needs: Not on file  Physical Activity: Not on file  Stress: Not on file  Social Connections: Not on file  Intimate Partner Violence: Not on file    SDOH:  SDOH Screenings   Alcohol Screen: Not on file  Depression [id number]): Not on file  Financial Resource Strain: Not on file  Food Insecurity: Not on file  Housing: Not on file  Physical Activity: Not on file  Social Connections: Not on file  Stress: Not on file  Tobacco Use: Not on file  Transportation Needs: Not on file    Last Labs:  Admission on 12/21/2021  Component Date Value Ref Range Status   POC Amphetamine UR 12/21/2021 None Detected  NONE DETECTED (Cut Off Level 1000 ng/mL) Final   POC Secobarbital (BAR) 12/21/2021 None Detected  NONE DETECTED (Cut Off Level 300 ng/mL) Final   POC Buprenorphine (BUP) 12/21/2021 None Detected  NONE DETECTED (Cut Off Level 10 ng/mL) Final   POC Oxazepam (BZO) 12/21/2021 None Detected  NONE DETECTED (Cut Off Level 300 ng/mL) Final   POC Cocaine UR 12/21/2021 None Detected  NONE DETECTED (Cut Off Level 300 ng/mL) Final   POC Methamphetamine UR 12/21/2021 None Detected  NONE DETECTED (Cut Off Level 1000 ng/mL) Final   POC Morphine 12/21/2021 None Detected  NONE DETECTED (Cut Off Level 300 ng/mL) Final   POC Oxycodone UR 12/21/2021 None Detected  NONE DETECTED (Cut Off Level 100 ng/mL) Final   POC Methadone UR 12/21/2021 None Detected  NONE DETECTED (Cut Off Level 300 ng/mL) Final   POC  Marijuana UR 12/21/2021 Positive (A)  NONE DETECTED (Cut Off Level 50 ng/mL) Final   SARS Coronavirus 2 Ag 12/22/2021 Negative  Negative Preliminary   SARSCOV2ONAVIRUS 2 AG 12/22/2021 NEGATIVE  NEGATIVE Final   Comment: (NOTE) SARS-CoV-2 antigen NOT DETECTED.   Negative results are presumptive.  Negative results do not preclude SARS-CoV-2 infection and should not be used as the sole basis for treatment or other patient management decisions, including infection  control decisions, particularly in the presence of clinical signs and  symptoms consistent with COVID-19, or in those who have been in contact with the virus.  Negative results must be combined with clinical observations, patient history, and epidemiological information. The expected result is Negative.  Fact Sheet for Patients: HandmadeRecipes.com.cy  Fact Sheet for Healthcare Providers: FuneralLife.at  This test is not yet approved or cleared by the [place] FDA and  has been authorized for detection and/or diagnosis of SARS-CoV-2 by FDA under an Emergency Use Authorization (EUA).  This EUA will remain in effect (meaning this test can be used) for the duration of  the COV                          ID-19 declaration under Section 564(b)(1) of the Act, 21 U.S.C. section 360bbb-3(b)(1), unless the authorization is terminated or revoked sooner.      Allergies: Patient has no known allergies.  PTA Medications: (Not in a hospital admission)   Medical Decision Making  Inpatient admission Meds ordered this encounter  Medications   acetaminophen (TYLENOL) tablet 650 mg   alum & mag hydroxide-simeth (MAALOX/MYLANTA) 200-200-20 MG/5ML suspension 30 mL   magnesium hydroxide (MILK OF MAGNESIA) suspension 30 mL   hydrOXYzine (ATARAX) tablet 25 mg   traZODone (DESYREL) tablet 50 mg   OLANZapine (ZYPREXA) tablet 5 mg       Lab Orders         Resp Panel by RT-PCR (Flu A&B,  Covid) Nasopharyngeal Swab         CBC with Differential/Platelet         Comprehensive metabolic panel         Hemoglobin A1c         Ethanol  TSH         Lipid panel         POCT Urine Drug Screen - ([facility])         POC SARS Coronavirus 2 Ag-ED - Nasal Swab         POC SARS Coronavirus 2 Ag       Recommendations  Based on my evaluation the patient does not appear to have an emergency medical condition.  Evette Georges, NP 12/22/21  12:20 AM

## 2021-12-22 ENCOUNTER — Other Ambulatory Visit: Payer: Self-pay

## 2021-12-22 ENCOUNTER — Encounter (HOSPITAL_COMMUNITY): Payer: Self-pay | Admitting: Nurse Practitioner

## 2021-12-22 ENCOUNTER — Inpatient Hospital Stay (HOSPITAL_COMMUNITY)
Admission: AD | Admit: 2021-12-22 | Discharge: 2021-12-26 | DRG: 885 | Disposition: A | Discharge status: Home / Self Care | Payer: 59 | Source: Ambulatory Visit | Attending: Psychiatry | Admitting: Psychiatry

## 2021-12-22 DIAGNOSIS — B353 Tinea pedis: Secondary | ICD-10-CM | POA: Diagnosis present

## 2021-12-22 DIAGNOSIS — E559 Vitamin D deficiency, unspecified: Secondary | ICD-10-CM | POA: Diagnosis present

## 2021-12-22 DIAGNOSIS — F609 Personality disorder, unspecified: Secondary | ICD-10-CM | POA: Diagnosis present

## 2021-12-22 DIAGNOSIS — F419 Anxiety disorder, unspecified: Secondary | ICD-10-CM | POA: Diagnosis present

## 2021-12-22 DIAGNOSIS — F333 Major depressive disorder, recurrent, severe with psychotic symptoms: Secondary | ICD-10-CM | POA: Diagnosis present

## 2021-12-22 DIAGNOSIS — G4700 Insomnia, unspecified: Secondary | ICD-10-CM | POA: Diagnosis present

## 2021-12-22 DIAGNOSIS — R45851 Suicidal ideations: Secondary | ICD-10-CM | POA: Diagnosis present

## 2021-12-22 DIAGNOSIS — Z635 Disruption of family by separation and divorce: Secondary | ICD-10-CM

## 2021-12-22 DIAGNOSIS — F1290 Cannabis use, unspecified, uncomplicated: Secondary | ICD-10-CM | POA: Diagnosis present

## 2021-12-22 DIAGNOSIS — F17210 Nicotine dependence, cigarettes, uncomplicated: Secondary | ICD-10-CM | POA: Diagnosis present

## 2021-12-22 DIAGNOSIS — F1090 Alcohol use, unspecified, uncomplicated: Secondary | ICD-10-CM | POA: Diagnosis present

## 2021-12-22 DIAGNOSIS — F1020 Alcohol dependence, uncomplicated: Secondary | ICD-10-CM | POA: Diagnosis present

## 2021-12-22 DIAGNOSIS — F12929 Cannabis use, unspecified with intoxication, unspecified: Secondary | ICD-10-CM | POA: Diagnosis present

## 2021-12-22 DIAGNOSIS — F29 Unspecified psychosis not due to a substance or known physiological condition: Principal | ICD-10-CM | POA: Diagnosis present

## 2021-12-22 DIAGNOSIS — F1220 Cannabis dependence, uncomplicated: Secondary | ICD-10-CM | POA: Diagnosis present

## 2021-12-22 DIAGNOSIS — Z23 Encounter for immunization: Secondary | ICD-10-CM | POA: Diagnosis not present

## 2021-12-22 DIAGNOSIS — F430 Acute stress reaction: Secondary | ICD-10-CM | POA: Diagnosis not present

## 2021-12-22 DIAGNOSIS — F17200 Nicotine dependence, unspecified, uncomplicated: Secondary | ICD-10-CM | POA: Diagnosis present

## 2021-12-22 LAB — COMPREHENSIVE METABOLIC PANEL
ALT: 25 U/L (ref 0–44)
AST: 33 U/L (ref 15–41)
Albumin: 4 g/dL (ref 3.5–5.0)
Alkaline Phosphatase: 42 U/L (ref 38–126)
Anion gap: 11 (ref 5–15)
BUN: 8 mg/dL (ref 6–20)
CO2: 27 mmol/L (ref 22–32)
Calcium: 9.4 mg/dL (ref 8.9–10.3)
Chloride: 102 mmol/L (ref 98–111)
Creatinine, Ser: 1.33 mg/dL — ABNORMAL HIGH (ref 0.61–1.24)
GFR, Estimated: >60 mL/min (ref >60)
Glucose, Bld: 102 mg/dL — ABNORMAL HIGH (ref 70–99)
Potassium: 3.6 mmol/L (ref 3.5–5.1)
Sodium: 140 mmol/L (ref 135–145)
Total Bilirubin: 0.8 mg/dL (ref 0.3–1.2)
Total Protein: 7.2 g/dL (ref 6.5–8.1)

## 2021-12-22 LAB — CBC WITH DIFFERENTIAL/PLATELET
Abs Immature Granulocytes: 0.02 10*3/uL (ref 0.00–0.07)
Basophils Absolute: 0 10*3/uL (ref 0.0–0.1)
Basophils Relative: 0 %
Eosinophils Absolute: 0.1 10*3/uL (ref 0.0–0.5)
Eosinophils Relative: 1 %
HCT: 46.1 % (ref 39.0–52.0)
Hemoglobin: 15.6 g/dL (ref 13.0–17.0)
Immature Granulocytes: 0 %
Lymphocytes Relative: 25 %
Lymphs Abs: 1.9 10*3/uL (ref 0.7–4.0)
MCH: 27.9 pg (ref 26.0–34.0)
MCHC: 33.8 g/dL (ref 30.0–36.0)
MCV: 82.5 fL (ref 80.0–100.0)
Monocytes Absolute: 0.6 10*3/uL (ref 0.1–1.0)
Monocytes Relative: 8 %
Neutro Abs: 4.7 10*3/uL (ref 1.7–7.7)
Neutrophils Relative %: 66 %
Platelets: 171 10*3/uL (ref 150–400)
RBC: 5.59 MIL/uL (ref 4.22–5.81)
RDW: 14.6 % (ref 11.5–15.5)
WBC: 7.3 10*3/uL (ref 4.0–10.5)
nRBC: 0 % (ref 0.0–0.2)

## 2021-12-22 LAB — POC SARS CORONAVIRUS 2 AG -  ED: SARS Coronavirus 2 Ag: NEGATIVE

## 2021-12-22 LAB — LIPID PANEL
Cholesterol: 167 mg/dL (ref 0–200)
HDL: 53 mg/dL (ref >40)
LDL Cholesterol: 99 mg/dL (ref 0–99)
Total CHOL/HDL Ratio: 3.2 RATIO
Triglycerides: 74 mg/dL (ref <150)
VLDL: 15 mg/dL (ref 0–40)

## 2021-12-22 LAB — HEMOGLOBIN A1C
Hgb A1c MFr Bld: 5.1 % (ref 4.8–5.6)
Mean Plasma Glucose: 99.67 mg/dL

## 2021-12-22 LAB — TSH: TSH: 2.902 u[IU]/mL (ref 0.350–4.500)

## 2021-12-22 LAB — RESP PANEL BY RT-PCR (FLU A&B, COVID) ARPGX2
Influenza A by PCR: NEGATIVE
Influenza B by PCR: NEGATIVE
SARS Coronavirus 2 by RT PCR: NEGATIVE

## 2021-12-22 LAB — POC SARS CORONAVIRUS 2 AG: SARSCOV2ONAVIRUS 2 AG: NEGATIVE

## 2021-12-22 LAB — ETHANOL: Alcohol, Ethyl (B): <10 mg/dL (ref <10)

## 2021-12-22 MED ORDER — MAGNESIUM HYDROXIDE 400 MG/5ML PO SUSP: 30.0000 mL | Freq: Every day | ORAL | Status: DC | PRN

## 2021-12-22 MED ORDER — HYDROXYZINE HCL 25 MG PO TABS
25.0000 mg | ORAL_TABLET | Freq: Three times a day (TID) | ORAL | Status: DC | PRN
  Administered 2021-12-23 – 2021-12-25 (×4): 25 mg via ORAL
  Filled 2021-12-22 (×4): qty 1

## 2021-12-22 MED ORDER — OLANZAPINE 5 MG PO TBDP
5.0000 mg | ORAL_TABLET | Freq: Every day | ORAL | Status: DC
  Filled 2021-12-22 (×3): qty 1

## 2021-12-22 MED ORDER — ZIPRASIDONE MESYLATE 20 MG IM SOLR: 20.0000 mg | INTRAMUSCULAR | Status: DC | PRN

## 2021-12-22 MED ORDER — OLANZAPINE 5 MG PO TABS
5.0000 mg | ORAL_TABLET | Freq: Every day | ORAL | Status: DC
Start: 2021-12-22 — End: 2021-12-22
  Administered 2021-12-22 (×2): 5 mg via ORAL
  Filled 2021-12-22 (×2): qty 1

## 2021-12-22 MED ORDER — OLANZAPINE 5 MG PO TBDP: 5.0000 mg | ORAL_TABLET | Freq: Three times a day (TID) | ORAL | Status: DC | PRN

## 2021-12-22 MED ORDER — HYDROXYZINE HCL 25 MG PO TABS
25.0000 mg | ORAL_TABLET | Freq: Three times a day (TID) | ORAL | Status: DC | PRN
  Administered 2021-12-22 (×2): 25 mg via ORAL
  Filled 2021-12-22 (×2): qty 1

## 2021-12-22 MED ORDER — LORAZEPAM 1 MG PO TABS
1.0000 mg | ORAL_TABLET | ORAL | Status: DC | PRN
  Filled 2021-12-22: qty 1

## 2021-12-22 MED ORDER — ACETAMINOPHEN 325 MG PO TABS
650.0000 mg | ORAL_TABLET | Freq: Four times a day (QID) | ORAL | Status: DC | PRN
Start: 2021-12-22 — End: 2021-12-26
  Administered 2021-12-25 (×2): 650 mg via ORAL
  Filled 2021-12-22 (×2): qty 2

## 2021-12-22 MED ORDER — TRAZODONE HCL 50 MG PO TABS
50.0000 mg | ORAL_TABLET | Freq: Every evening | ORAL | Status: DC | PRN
  Administered 2021-12-23 – 2021-12-24 (×2): 50 mg via ORAL
  Filled 2021-12-22 (×2): qty 1

## 2021-12-22 MED ORDER — ALUM & MAG HYDROXIDE-SIMETH 200-200-20 MG/5ML PO SUSP: 30.0000 mL | ORAL | Status: DC | PRN

## 2021-12-22 MED ORDER — TRAZODONE HCL 50 MG PO TABS
50.0000 mg | ORAL_TABLET | Freq: Every evening | ORAL | Status: DC | PRN
Start: 2021-12-22 — End: 2021-12-22
  Administered 2021-12-22: 50 mg via ORAL
  Filled 2021-12-22: qty 1

## 2021-12-22 NOTE — ED Notes (Addendum)
Patient has been sleeping most of the day, was only a wake a short time for breakfast and lunch and his EKG. ?

## 2021-12-22 NOTE — Progress Notes (Signed)
?   12/21/21 2336  ?[place] Triage Screening (Walk-ins at [facility] only)  ?How Did You Hear About [place]? Self  ?What Is the Reason for Your Visit/Call Today? Tyler [name] is a 36 year old male presenting voluntary as a walk-in to [facility] due to [place] with plans to hang himself, cut himself, step into traffic or the train tracks. Patient reported "I have thoughts of killing myself every hour". Patient also reports command hallucinations to harm himself. Patient reports command hallucinations to harm himself. Patient reported hearing voices since the 2nd grade. Patient reported he was on psych medications up till age 79 when he went to prison. Patient reported stressor of separating from wife 2 weeks, patient reported attempting to hang himself and then stopping because his daughter walk-in. Patient reported ongoing grief loss issues, daughter died 65 years ago and grandmother died 1 year ago, which was 1 day before daughters birthday. Patient reported worsening depressive symptoms. Patient reported psych hospitalization at [facility] in 2021. Patient denied self-harming behaviors and SI. Patient currently resides with parents. Patient reported after being separated due to wife's concerns. Patient was calm and cooperative.  ?How Long Has This Been Causing You Problems? > than 6 months  ?Have You Recently Had Any Thoughts About Hurting Yourself? Yes  ?How long ago did you have thoughts about hurting yourself? prior to arrival  ?Are You Planning to [place] At This time? Yes  ?Have you Recently Had Thoughts About [place]? No  ?Are You Planning To Harm Someone At This Time? No  ?Are you currently experiencing any auditory, visual or other hallucinations? Yes  ?Please explain the hallucinations you are currently experiencing: Command voices to harm self.  ?Have You Used Any Alcohol or Drugs in the Past 24 Hours? No  ?Do you have any current medical co-morbidities that require immediate attention? No   ?Clinician description of patient physical appearance/behavior: casual / cooperative  ?What Do You Feel Would Help You the Most Today? Treatment for Depression or other mood problem  ?If access to [facility] was not available, would you have sought care in the Emergency Department? Yes  ?Determination of Need Emergent (2 hours)  ?Options For Referral Inpatient Hospitalization;Outpatient Therapy;Medication Management  ? ? ?

## 2021-12-22 NOTE — ED Notes (Signed)
Safe Transport requested. 

## 2021-12-22 NOTE — ED Notes (Addendum)
Pt agitated and anxious, reports the voices in his head are upsetting him.  Pt threw clothing on floor.  Staff spoke with pt and pt calmer at present.  PRN and scheduled meds given.  Pt taking a shower at present.  Pt cooperative at present.  Monitoring for safety.  Pending report to [facility], rm 407-2 and transfer. ?

## 2021-12-22 NOTE — Progress Notes (Signed)
Patient is currently asleep in bed without issue or complaint.  No acute respiratory distress.  Will monitor and provide safe environment for patient.   ?

## 2021-12-22 NOTE — Progress Notes (Signed)
Patient has been asleep all day.  He is scheduled to go to [facility] after 8pm tonight and his room number is 407-2.  Patient is aware of these plans.  Will continue to monitor and maintain safe environment.   ?

## 2021-12-22 NOTE — ED Notes (Signed)
Report called to [employer], [facility] rm [phone].  Pending repeat EKG and transfer to [facility] via Safe transport. ?

## 2021-12-22 NOTE — ED Notes (Addendum)
Pt A&O x 4, presents with suicidal ideations, plan to hang self or cut self. Hearing voices to harm self.  Denies HI or visual hallucinations.  Pt calm & cooperative., no distress noted.  Monitoring for safety. ?

## 2021-12-22 NOTE — Progress Notes (Signed)
Pt was accepted to [facility] 12/22/21; Bed Assignment 407-2 ? ?Pt meets inpatient criteria per Doran Heater, FNP ? ?Report can be called to: Adult unit: [phone] ? ?Care Team notified: Doran Heater, FNP, Edd Arbour, Cherlynn Polo, RN, Malva Limes, RN, Leandra Kern, NT, Garvin Fila, RN, Fransico Michael, RN, [facility] night AC.   ? ?Kelton Pillar, LCSWA ?12/22/2021 @ 4:54 PM ? ?

## 2021-12-22 NOTE — ED Provider Notes (Signed)
FBC/OBS ASAP Discharge Summary  Date and Time: 12/22/2021 6:00 PM  Name: Tyler [name]  MRN:  [id number]   Discharge Diagnoses:  Final diagnoses:  Psychosis, unspecified psychosis type ([place])  Panic attack as reaction to stress  Alcohol abuse    Subjective: Patient states "I am okay."   Patient is reassessed, face-to-face, by nurse practitioner.  He is alert and oriented, pleasant and cooperative during assessment.  He is reclined in observation area upon my approach, appears asleep.  He is easily awakened. Billion continues to endorse suicidal ideations.  He is unable to contract verbally for safety at this time. Recent stressors include being discharged from prison approximately 9 months ago.  He is not linked with outpatient psychiatry at this time.  Would like to follow-up with outpatient psychiatry once discharged from inpatient psychiatric treatment. He denies auditory or visual hallucinations at this time.  There is no evidence of delusional thought content and no indication that patient is responding to internal stimuli. Patient has been accepted to inpatient psychiatric hospitalization at [facility] behavioral [facility].  He reports his preference was to return to [facility], reports he was last hospitalized at [facility] 1 year ago.  He remains voluntary and agrees with plan for inpatient psychiatric hospitalization at [facility] behavioral health.  Patient offered support and encouragement.  Stay Summary: HPI from 12/21/2021 at 13pm 36 male, presented to [facility] with suicidal ideation,  and auditory Hallucination,  per the patient he hears voices telling him to kill himself every hour on the hour.  He also report suicidal ideation "hang self,  step into traffic".  Pt did report he has three different personality with [name] been the main one telling him "you deserve to burn in hell".     Pt report he has been drinking two 40's liquor daily,  last night was last drink he also report smoking marijuana  daily.  Denies any other illicit drug used.     According to patient he was was taking psych medication up until age 36 year old when he went to prison.  He was also a patient at [facility],  pt report he had 7 panic attach today,  with symptoms of SOB, feeling numb and weakness.    Pt has prior history of suicidal Ideation which he try to hang himself,  but stated to stop when is daughter walked in on him.     Pt denies visual hallucination, report poor sleep,  decrease appetite,  increase depression.  Pt denies having access to guns.  Pt reported he was taking Seroquel 150 mg BID a few year ago.    Pt appearance was unkept, non-groomed,  poor eye contact.    Total Time spent with patient: 20 minutes  Past Psychiatric History: Anxiety Past Medical History:  Past Medical History:  Diagnosis Date   Anxiety    panic and SOB    Past Surgical History:  Procedure Laterality Date   IRRIGATION AND DEBRIDEMENT ABSCESS N/A 08/04/2020   Procedure: IRRIGATION AND DEBRIDEMENT PERINEAL ABSCESS, [place];  Surgeon: Irine Seal, MD;  Location: [facility];  Service: Urology;  Laterality: N/A;   IRRIGATION AND DEBRIDEMENT ABSCESS N/A 10/20/2020   Procedure: IRRIGATION AND DEBRIDEMENT PERINEAL  ABSCESS;  Surgeon: Alexis Frock, MD;  Location: [facility];  Service: Urology;  Laterality: N/A;   IRRIGATION AND DEBRIDEMENT BUTTOCKS N/A 10/28/2020   Procedure: IRRIGATION AND DEBRIDEMENT PERINEAL ABCESS;  Surgeon: Coralie Keens, MD;  Location: [facility];  Service: General;  Laterality:  N/A;  packing 2" gauze   TRANSURETHRAL RESECTION OF PROSTATE  07/21/2020   Procedure: TRANSURETHRAL RESECTION OF THE PROSTATE (TURP);  Surgeon: Ceasar Mons, MD;  Location: [facility];  Service: Urology;;   TRANSURETHRAL RESECTION OF PROSTATE N/A 10/20/2020   Procedure: CYSTOSCOPY, TRANSURETHRAL RESECTION OF THE PROSTATE ABCESS(TURP);  Surgeon: Alexis Frock, MD;  Location: [facility];  Service: Urology;   Laterality: N/A;   Family History: History reviewed. No pertinent family history. Family Psychiatric History: None reported mood Social History:  Social History   Substance and Sexual Activity  Alcohol Use Yes     Social History   Substance and Sexual Activity  Drug Use Yes   Types: Marijuana    Social History   Socioeconomic History   Marital status: Single    Spouse name: Not on file   Number of children: Not on file   Years of education: Not on file   Highest education level: Not on file  Occupational History   Not on file  Tobacco Use   Smoking status: Every Day    Packs/day: 1.00    Types: Cigarettes   Smokeless tobacco: Never  Vaping Use   Vaping Use: Never used  Substance and Sexual Activity   Alcohol use: Yes   Drug use: Yes    Types: Marijuana   Sexual activity: Not on file  Other Topics Concern   Not on file  Social History Narrative   Not on file   Social Determinants of Health   Financial Resource Strain: Not on file  Food Insecurity: Not on file  Transportation Needs: Not on file  Physical Activity: Not on file  Stress: Not on file  Social Connections: Not on file   SDOH:  SDOH Screenings   Alcohol Screen: Not on file  Depression (PHQ2-9): Not on file  Financial Resource Strain: Not on file  Food Insecurity: Not on file  Housing: Not on file  Physical Activity: Not on file  Social Connections: Not on file  Stress: Not on file  Tobacco Use: High Risk   Smoking Tobacco Use: Every Day   Smokeless Tobacco Use: Never   Passive Exposure: Not on file  Transportation Needs: Not on file    Tobacco Cessation:  A prescription for an FDA-approved tobacco cessation medication was offered at discharge and the patient refused  Current Medications:  Current Facility-Administered Medications  Medication Dose Route Frequency Provider Last Rate Last Admin   acetaminophen (TYLENOL) tablet 650 mg  650 mg Oral Q6H PRN Evette Georges, NP       alum &  mag hydroxide-simeth (MAALOX/MYLANTA) 200-200-20 MG/5ML suspension 30 mL  30 mL Oral Q4H PRN Evette Georges, NP       hydrOXYzine (ATARAX) tablet 25 mg  25 mg Oral TID PRN Evette Georges, NP   25 mg at 12/22/21 0117   magnesium hydroxide (MILK OF MAGNESIA) suspension 30 mL  30 mL Oral Daily PRN Evette Georges, NP       OLANZapine (ZYPREXA) tablet 5 mg  5 mg Oral QHS Evette Georges, NP   5 mg at 12/22/21 0117   traZODone (DESYREL) tablet 50 mg  50 mg Oral QHS PRN Evette Georges, NP   50 mg at 12/22/21 0117   No current outpatient medications on file.    PTA Medications: (Not in a hospital admission)   Musculoskeletal  Strength & Muscle Tone: within normal limits Gait & Station: normal Patient leans: N/A  Psychiatric Specialty Exam  Presentation  General  Appearance: Appropriate for Environment; Casual  Eye Contact:Fair  Speech:Clear and Coherent; Normal Rate  Speech Volume:Normal  Handedness:Right   Mood and Affect  Mood:Depressed  Affect:Congruent; Depressed   Thought Process  Thought Processes:Coherent; Goal Directed; Linear  Descriptions of Associations:Intact  Orientation:Full (Time, Place and Person)  Thought Content:Logical; WDL     Hallucinations:Hallucinations: None Description of Auditory Hallucinations: kill himself every hour on the hour  Ideas of Reference:None  Suicidal Thoughts:Suicidal Thoughts: Yes, Active SI Active Intent and/or Plan: With Intent  Homicidal Thoughts:Homicidal Thoughts: No   Sensorium  Memory:Immediate Good; Recent Fair  Judgment:Intact  Insight:Present   Executive Functions  Concentration:Good  Attention Span:Good  [place] of Knowledge:Good  Language:Good   Psychomotor Activity  Psychomotor Activity:Psychomotor Activity: Normal   Assets  Assets:Communication Skills; Desire for Improvement; Financial Resources/Insurance; Physical Health; Resilience; Social Support   Sleep  Sleep:Sleep:  Fair   Nutritional Assessment (For OBS and FBC admissions only) Has the patient had a weight loss or gain of 10 pounds or more in the last 3 months?: No Has the patient had a decrease in food intake/or appetite?: Yes Does the patient have dental problems?: No Does the patient have eating habits or behaviors that may be indicators of an eating disorder including binging or inducing vomiting?: Yes Has the patient recently lost weight without trying?: 0 Has the patient been eating poorly because of a decreased appetite?: 0 Malnutrition Screening Tool Score: 0    Physical Exam  Physical Exam Vitals and nursing note reviewed.  Constitutional:      Appearance: Normal appearance. He is well-developed.  HENT:     Head: Normocephalic and atraumatic.     Nose: Nose normal.  Cardiovascular:     Rate and Rhythm: Normal rate.  Pulmonary:     Effort: Pulmonary effort is normal.  Musculoskeletal:        General: Normal range of motion.     Cervical back: Normal range of motion.  Skin:    General: Skin is warm and dry.  Neurological:     Mental Status: He is alert and oriented to person, place, and time.  Psychiatric:        Attention and Perception: Attention and perception normal.        Mood and Affect: Affect normal. Mood is depressed.        Speech: Speech normal.        Behavior: Behavior normal. Behavior is cooperative.        Thought Content: Thought content includes suicidal ideation.        Cognition and Memory: Cognition and memory normal.   Review of Systems  Constitutional: Negative.   HENT: Negative.    Eyes: Negative.   Respiratory: Negative.    Cardiovascular: Negative.   Gastrointestinal: Negative.   Genitourinary: Negative.   Musculoskeletal: Negative.   Skin: Negative.   Neurological: Negative.   Endo/Heme/Allergies: Negative.   Psychiatric/Behavioral:  Positive for depression and suicidal ideas.   Blood pressure 119/78, pulse 75, temperature 97.8 F (36.6  C), temperature source Oral, resp. rate 18, SpO2 100 %. There is no height or weight on file to calculate BMI.  Demographic Factors:  Male and Unemployed  Loss Factors: Legal issues  Historical Factors: Prior suicide attempts  Risk Reduction Factors:   Living with another person, especially a relative, Positive social support, Positive therapeutic relationship, and Positive coping skills or problem solving skills  Continued Clinical Symptoms:  Previous Psychiatric Diagnoses and Treatments  Cognitive Features  That Contribute To Risk:  None    Suicide Risk:  Minimal: No identifiable suicidal ideation.  Patients presenting with no risk factors but with morbid ruminations; may be classified as minimal risk based on the severity of the depressive symptoms  Plan Of Care/Follow-up recommendations:  Patient reviewed with Dr. Serafina Mitchell.  Disposition: Transfer to inpatient psychiatric treatment at [facility] behavioral health, patient has been accepted by Dr. Magdalen Spatz.  Lucky Rathke, FNP 12/22/2021, 6:00 PM

## 2021-12-22 NOTE — ED Notes (Signed)
Pt sleeping@this time. Breathing even and unlabored. Will continue to monitor for safety 

## 2021-12-23 ENCOUNTER — Encounter (HOSPITAL_COMMUNITY): Payer: Self-pay | Admitting: Family

## 2021-12-23 DIAGNOSIS — F419 Anxiety disorder, unspecified: Secondary | ICD-10-CM | POA: Diagnosis present

## 2021-12-23 MED ORDER — LORAZEPAM 2 MG/ML IJ SOLN: 2.0000 mg | Freq: Four times a day (QID) | INTRAMUSCULAR | Status: DC | PRN

## 2021-12-23 MED ORDER — INFLUENZA VAC SPLIT QUAD 0.5 ML IM SUSY
0.5000 mL | PREFILLED_SYRINGE | INTRAMUSCULAR | Status: AC
  Administered 2021-12-24: 0.5 mL via INTRAMUSCULAR
  Filled 2021-12-23: qty 0.5

## 2021-12-23 MED ORDER — LORAZEPAM 1 MG PO TABS: 1.0000 mg | ORAL_TABLET | Freq: Three times a day (TID) | ORAL | Status: DC | PRN

## 2021-12-23 MED ORDER — BENZTROPINE MESYLATE 1 MG PO TABS: 1.0000 mg | ORAL_TABLET | Freq: Four times a day (QID) | ORAL | Status: DC | PRN

## 2021-12-23 MED ORDER — NICOTINE POLACRILEX 2 MG MT GUM
2.0000 mg | CHEWING_GUM | OROMUCOSAL | Status: DC | PRN
  Filled 2021-12-23: qty 1

## 2021-12-23 MED ORDER — THIAMINE HCL 100 MG PO TABS
100.0000 mg | ORAL_TABLET | Freq: Every day | ORAL | Status: DC
  Administered 2021-12-23 – 2021-12-26 (×4): 100 mg via ORAL
  Filled 2021-12-23 (×6): qty 1

## 2021-12-23 MED ORDER — SERTRALINE HCL 25 MG PO TABS
25.0000 mg | ORAL_TABLET | Freq: Every day | ORAL | Status: DC
  Administered 2021-12-24: 25 mg via ORAL
  Filled 2021-12-23 (×2): qty 1

## 2021-12-23 MED ORDER — HALOPERIDOL 5 MG PO TABS
ORAL_TABLET | ORAL | Status: AC
  Filled 2021-12-23: qty 1

## 2021-12-23 MED ORDER — QUETIAPINE FUMARATE 50 MG PO TABS
50.0000 mg | ORAL_TABLET | Freq: Every day | ORAL | Status: DC
  Filled 2021-12-23: qty 1

## 2021-12-23 MED ORDER — HALOPERIDOL 5 MG PO TABS
5.0000 mg | ORAL_TABLET | Freq: Every day | ORAL | Status: DC
  Administered 2021-12-23 – 2021-12-25 (×3): 5 mg via ORAL
  Filled 2021-12-23 (×5): qty 1

## 2021-12-23 MED ORDER — HALOPERIDOL 5 MG PO TABS
5.0000 mg | ORAL_TABLET | Freq: Once | ORAL | Status: AC
  Administered 2021-12-23: 12:00:00 5 mg via ORAL

## 2021-12-23 MED ORDER — HALOPERIDOL 5 MG PO TABS: 5.0000 mg | ORAL_TABLET | Freq: Four times a day (QID) | ORAL | Status: DC | PRN

## 2021-12-23 MED ORDER — HALOPERIDOL LACTATE 5 MG/ML IJ SOLN: 5.0000 mg | Freq: Four times a day (QID) | INTRAMUSCULAR | Status: DC | PRN

## 2021-12-23 MED ORDER — LORAZEPAM 1 MG PO TABS
1.0000 mg | ORAL_TABLET | Freq: Once | ORAL | Status: AC
  Administered 2021-12-23: 12:00:00 1 mg via ORAL

## 2021-12-23 MED ORDER — BENZTROPINE MESYLATE 1 MG/ML IJ SOLN: 1.0000 mg | Freq: Four times a day (QID) | INTRAMUSCULAR | Status: DC | PRN

## 2021-12-23 NOTE — Progress Notes (Signed)
Psychoeducational Group Note ? ?Date:  12/23/2021 ?Time:  2025 ? ?Group Topic/Focus:  ?Wrap-Up Group:   The focus of this group is to help patients review their daily goal of treatment and discuss progress on daily workbooks. ? ?Participation Level: Did Not Attend ? ?Participation Quality:  Not Applicable ? ?Affect:  Not Applicable ? ?Cognitive:  Not Applicable ? ?Insight:  Not Applicable ? ?Engagement in Group: Not Applicable ? ?Additional Comments:  The patient did not attend group this evening.  ? ?Hazle Coca S ?12/23/2021, 8:25 PM  ?

## 2021-12-23 NOTE — Progress Notes (Signed)
Pt continues to endorse AH, passive SI but contracts for safety. Pt stated he was feeling a little better ? ? ? 12/23/21 2100  ?Psych Admission Type (Psych Patients Only)  ?Admission Status Voluntary  ?Psychosocial Assessment  ?Patient Complaints Suspiciousness;Anxiety;Depression  ?Eye Contact Fair  ?Facial Expression Anxious  ?Affect Preoccupied  ?Speech Logical/coherent  ?Interaction Assertive  ?Motor Activity Slow  ?Appearance/Hygiene Unremarkable  ?Behavior Characteristics Cooperative  ?Mood Depressed  ?Aggressive Behavior  ?Effect No apparent injury  ?Thought Process  ?Coherency Circumstantial  ?Content WDL  ?Delusions None reported or observed  ?Perception Hallucinations  ?Hallucination Auditory  ?Judgment Poor  ?Confusion None  ?Danger to Self  ?Current suicidal ideation? Passive  ?Self-Injurious Behavior Some self-injurious ideation observed or expressed.  No lethal plan expressed   ?Agreement Not to Harm Self Yes  ?Description of Agreement verbal contract for safety  ?Danger to Others  ?Danger to Others None reported or observed  ? ? ?
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
## 2021-12-23 NOTE — H&P (Addendum)
Psychiatric Admission Assessment Adult  Patient Identification: Tyler [name] MRN:  [id number] Date of Evaluation:  12/23/2021 Chief Complaint:  Psychosis (HCC) [F29] Principal Diagnosis: Major depressive disorder, recurrent episode, severe, with psychosis (HCC) Diagnosis:  Principal Problem:   Major depressive disorder, recurrent episode, severe, with psychosis (HCC) Active Problems:   Moderate alcohol use disorder (HCC)   Moderate cannabis use disorder (HCC)   Nicotine use disorder   Anxiety disorder, unspecified  History of Present Illness: Tyler [name] is a 36 YO M who reports a psychiatric history of bipolar schizophrenia and chief complaint of "constant suicidal thoughts and numerous personalities". He states that he has been depressed for his "whole life". He sleeps mostly during the day and is up at night. His appetite is up and down. He hears the voices of "jack, Gabriel RungJoe and Nadine CountsBob" but finds that "Ree KidaJack is the one that want me to do it [die]" and tells him to kill himself or burn in hell. He denies wanting to die to this interviewer, and denies wanting to harm others. He has been under a lot of stress recently due to going through a divorce, and has been on the phone with his soon-to-be ex much of the morning. He denies delusions, paranoia. He is willing to take medications. He has been [facility] in the past and taken seroquel, and round that this medication was very helpful.              [name] is currently using 1-2 40 ounce beers per day as well as cannabis daily. He denies other illicit drug use. He does not regard alcohol or cannabis use as a significant problem at this time.  Associated Signs/Symptoms: Depression Symptoms:  depressed mood, insomnia, recurrent thoughts of death, anxiety, disturbed sleep, Duration of Depression Symptoms: Greater than two weeks  (Hypo) Manic Symptoms:  Distractibility, Hallucinations, Anxiety Symptoms:   N/A Psychotic Symptoms:  Hallucinations:  Auditory Command:  kill self PTSD Symptoms: Negative Total Time spent with patient: 45 minutes  Past Psychiatric History: [facility] at [facility], has been on Seroquel. Has previous attempt to hand self. Reports previous diagnosis of bipolar and schizophrenia  Is the patient at risk to self? Yes.    Has the patient been a risk to self in the past 6 months? Yes.    Has the patient been a risk to self within the distant past? Yes.    Is the patient a risk to others? No.  Has the patient been a risk to others in the past 6 months? No.  Has the patient been a risk to others within the distant past? No.   Prior Inpatient Therapy:   Prior Outpatient Therapy:    Alcohol Screening: 1. How often do you have a drink containing alcohol?: Monthly or less 2. How many drinks containing alcohol do you have on a typical day when you are drinking?: 3 or 4 3. How often do you have six or more drinks on one occasion?: Never AUDIT-C Score: 2 4. How often during the last year have you found that you were not able to stop drinking once you had started?: Never 5. How often during the last year have you failed to do what was normally expected from you because of drinking?: Never 6. How often during the last year have you needed a first drink in the morning to get yourself going after a heavy drinking session?: Never 7. How often during the last year have you had a feeling of  guilt of remorse after drinking?: Never 8. How often during the last year have you been unable to remember what happened the night before because you had been drinking?: Never 9. Have you or someone else been injured as a result of your drinking?: No 10. Has a relative or friend or a doctor or another health worker been concerned about your drinking or suggested you cut down?: No Alcohol Use Disorder Identification Test Final Score (AUDIT): 2 Alcohol Brief Interventions/Follow-up: Alcohol education/Brief advice Substance Abuse History  in the last 12 months:  Yes.   Consequences of Substance Abuse: Legal Consequences:  DWI Previous Psychotropic Medications: Yes  Psychological Evaluations: Yes  Past Medical History:  Past Medical History:  Diagnosis Date   Anxiety    panic and SOB    Past Surgical History:  Procedure Laterality Date   IRRIGATION AND DEBRIDEMENT ABSCESS N/A 08/04/2020   Procedure: IRRIGATION AND DEBRIDEMENT PERINEAL ABSCESS, Lovett Sox;  Surgeon: Bjorn Pippin, MD;  Location: [facility];  Service: Urology;  Laterality: N/A;   IRRIGATION AND DEBRIDEMENT ABSCESS N/A 10/20/2020   Procedure: IRRIGATION AND DEBRIDEMENT PERINEAL  ABSCESS;  Surgeon: Sebastian Ache, MD;  Location: [facility];  Service: Urology;  Laterality: N/A;   IRRIGATION AND DEBRIDEMENT BUTTOCKS N/A 10/28/2020   Procedure: IRRIGATION AND DEBRIDEMENT PERINEAL ABCESS;  Surgeon: Abigail Miyamoto, MD;  Location: [facility];  Service: General;  Laterality: N/A;  packing 2" gauze   TRANSURETHRAL RESECTION OF PROSTATE  07/21/2020   Procedure: TRANSURETHRAL RESECTION OF THE PROSTATE (TURP);  Surgeon: Rene Paci, MD;  Location: [facility];  Service: Urology;;   TRANSURETHRAL RESECTION OF PROSTATE N/A 10/20/2020   Procedure: CYSTOSCOPY, TRANSURETHRAL RESECTION OF THE PROSTATE ABCESS(TURP);  Surgeon: Sebastian Ache, MD;  Location: [facility];  Service: Urology;  Laterality: N/A;   Family History: History reviewed. No pertinent family history. Family Psychiatric  History: denies Tobacco Screening:   Social History:  Social History   Substance and Sexual Activity  Alcohol Use Yes     Social History   Substance and Sexual Activity  Drug Use Yes   Types: Marijuana    Additional Social History: Marital status: Separated Number of Years Married: 6 Separated, when?: 2 weeks What types of issues is patient dealing with in the relationship?: States her family kept getting involved and causing division. States that she chose them over  him. Additional relationship information: Have been together for 20 years Are you sexually active?: No What is your sexual orientation?: Heterosexual Has your sexual activity been affected by drugs, alcohol, medication, or emotional stress?: Denies Does patient have children?: Yes How many children?: 4 How is patient's relationship with their children?: Good with first child, has not seen middle two children in years, and is worried he won't be able to see youngest child                         Allergies:  No Known Allergies Lab Results:  Results for orders placed or performed during the hospital encounter of 12/21/21 (from the past 48 hour(s))  POCT Urine Drug Screen - ([facility])     Status: Abnormal   Collection Time: 12/21/21 11:54 PM  Result Value Ref Range   POC Amphetamine UR None Detected NONE DETECTED (Cut Off Level 1000 ng/mL)   POC Secobarbital (BAR) None Detected NONE DETECTED (Cut Off Level 300 ng/mL)   POC Buprenorphine (BUP) None Detected NONE DETECTED (Cut Off Level 10 ng/mL)   POC Oxazepam (BZO)  None Detected NONE DETECTED (Cut Off Level 300 ng/mL)   POC Cocaine UR None Detected NONE DETECTED (Cut Off Level 300 ng/mL)   POC Methamphetamine UR None Detected NONE DETECTED (Cut Off Level 1000 ng/mL)   POC Morphine None Detected NONE DETECTED (Cut Off Level 300 ng/mL)   POC Oxycodone UR None Detected NONE DETECTED (Cut Off Level 100 ng/mL)   POC Methadone UR None Detected NONE DETECTED (Cut Off Level 300 ng/mL)   POC Marijuana UR Positive (A) NONE DETECTED (Cut Off Level 50 ng/mL)  CBC with Differential/Platelet     Status: None   Collection Time: 12/22/21 12:07 AM  Result Value Ref Range   WBC 7.3 4.0 - 10.5 K/uL   RBC 5.59 4.22 - 5.81 MIL/uL   Hemoglobin 15.6 13.0 - 17.0 g/dL   HCT 16.1 09.6 - 04.5 %   MCV 82.5 80.0 - 100.0 fL   MCH 27.9 26.0 - 34.0 pg   MCHC 33.8 30.0 - 36.0 g/dL   RDW 40.9 81.1 - 91.4 %   Platelets 171 150 - 400 K/uL   nRBC 0.0 0.0 - 0.2 %    Neutrophils Relative % 66 %   Neutro Abs 4.7 1.7 - 7.7 K/uL   Lymphocytes Relative 25 %   Lymphs Abs 1.9 0.7 - 4.0 K/uL   Monocytes Relative 8 %   Monocytes Absolute 0.6 0.1 - 1.0 K/uL   Eosinophils Relative 1 %   Eosinophils Absolute 0.1 0.0 - 0.5 K/uL   Basophils Relative 0 %   Basophils Absolute 0.0 0.0 - 0.1 K/uL   Immature Granulocytes 0 %   Abs Immature Granulocytes 0.02 0.00 - 0.07 K/uL    Comment: Performed at [facility], [address] [place], [place] [address]  Comprehensive metabolic panel     Status: Abnormal   Collection Time: 12/22/21 12:07 AM  Result Value Ref Range   Sodium 140 135 - 145 mmol/L   Potassium 3.6 3.5 - 5.1 mmol/L   Chloride 102 98 - 111 mmol/L   CO2 27 22 - 32 mmol/L   Glucose, Bld 102 (H) 70 - 99 mg/dL    Comment: Glucose reference range applies only to samples taken after fasting for at least 8 hours.   BUN 8 6 - 20 mg/dL   Creatinine, Ser 6.21 (H) 0.61 - 1.24 mg/dL   Calcium 9.4 8.9 - 30.8 mg/dL   Total Protein 7.2 6.5 - 8.1 g/dL   Albumin 4.0 3.5 - 5.0 g/dL   AST 33 15 - 41 U/L   ALT 25 0 - 44 U/L   Alkaline Phosphatase 42 38 - 126 U/L   Total Bilirubin 0.8 0.3 - 1.2 mg/dL   GFR, Estimated >65 >78 mL/min    Comment: (NOTE) Calculated using the CKD-EPI Creatinine Equation (2021)    Anion gap 11 5 - 15    Comment: Performed at [facility], [address], [place], [place] [address]  Hemoglobin A1c     Status: None   Collection Time: 12/22/21 12:07 AM  Result Value Ref Range   Hgb A1c MFr Bld 5.1 4.8 - 5.6 %    Comment: (NOTE) Pre diabetes:          5.7%-6.4%  Diabetes:              >6.4%  Glycemic control for   <7.0% adults with diabetes    Mean Plasma Glucose 99.67 mg/dL    Comment: Performed at [facility], [address] [place], [place] [address]  Ethanol     Status: None   Collection Time: 12/22/21 12:07 AM  Result Value Ref Range   Alcohol, Ethyl (B) <10 <10 mg/dL    Comment: (NOTE) Lowest  detectable limit for serum alcohol is 10 mg/dL.  For medical purposes only. Performed at [facility], [address] [place], [place] [address]   TSH     Status: None   Collection Time: 12/22/21 12:07 AM  Result Value Ref Range   TSH 2.902 0.350 - 4.500 uIU/mL    Comment: Performed by a 3rd Generation assay with a functional sensitivity of <=0.01 uIU/mL. Performed at [facility], [address] [place], [place] [address]   POC SARS Coronavirus 2 Ag-ED - Nasal Swab     Status: Normal (Preliminary result)   Collection Time: 12/22/21 12:07 AM  Result Value Ref Range   SARS Coronavirus 2 Ag Negative Negative  Lipid panel     Status: None   Collection Time: 12/22/21 12:07 AM  Result Value Ref Range   Cholesterol 167 0 - 200 mg/dL   Triglycerides 74 <914 mg/dL   HDL 53 >78 mg/dL   Total CHOL/HDL Ratio 3.2 RATIO   VLDL 15 0 - 40 mg/dL   LDL Cholesterol 99 0 - 99 mg/dL    Comment:        Total Cholesterol/HDL:CHD Risk Coronary Heart Disease Risk Table                     Men   Women  1/2 Average Risk   3.4   3.3  Average Risk       5.0   4.4  2 X Average Risk   9.6   7.1  3 X Average Risk  23.4   11.0        Use the calculated Patient Ratio above and the CHD Risk Table to determine the patient's CHD Risk.        ATP III CLASSIFICATION (LDL):  <100     mg/dL   Optimal  295-621  mg/dL   Near or Above                    Optimal  130-159  mg/dL   Borderline  308-657  mg/dL   High  >846     mg/dL   Very High Performed at [facility], [address], [place], [place] [address]   Resp Panel by RT-PCR (Flu A&B, Covid) Nasopharyngeal Swab     Status: None   Collection Time: 12/22/21 12:08 AM   Specimen: Nasopharyngeal Swab; Nasopharyngeal(NP) swabs in vial transport medium  Result Value Ref Range   SARS Coronavirus 2 by RT PCR NEGATIVE NEGATIVE    Comment: (NOTE) SARS-CoV-2 target nucleic acids are NOT DETECTED.  The SARS-CoV-2 RNA is generally detectable  in upper respiratory specimens during the acute phase of infection. The lowest concentration of SARS-CoV-2 viral copies this assay can detect is 138 copies/mL. A negative result does not preclude SARS-Cov-2 infection and should not be used as the sole basis for treatment or other patient management decisions. A negative result may occur with  improper specimen collection/handling, submission of specimen other than nasopharyngeal swab, presence of viral mutation(s) within the areas targeted by this assay, and inadequate number of viral copies(<138 copies/mL). A negative result must be combined with clinical observations, patient history, and epidemiological information. The expected  result is Negative.  Fact Sheet for Patients:  BloggerCourse.com  Fact Sheet for Healthcare Providers:  SeriousBroker.it  This test is no t yet approved or cleared by the [place] FDA and  has been authorized for detection and/or diagnosis of SARS-CoV-2 by FDA under an Emergency Use Authorization (EUA). This EUA will remain  in effect (meaning this test can be used) for the duration of the COVID-19 declaration under Section 564(b)(1) of the Act, 21 U.S.C.section 360bbb-3(b)(1), unless the authorization is terminated  or revoked sooner.       Influenza A by PCR NEGATIVE NEGATIVE   Influenza B by PCR NEGATIVE NEGATIVE    Comment: (NOTE) The Xpert Xpress SARS-CoV-2/FLU/RSV plus assay is intended as an aid in the diagnosis of influenza from Nasopharyngeal swab specimens and should not be used as a sole basis for treatment. Nasal washings and aspirates are unacceptable for Xpert Xpress SARS-CoV-2/FLU/RSV testing.  Fact Sheet for Patients: BloggerCourse.com  Fact Sheet for Healthcare Providers: SeriousBroker.it  This test is not yet approved or cleared by the [place] FDA and has been  authorized for detection and/or diagnosis of SARS-CoV-2 by FDA under an Emergency Use Authorization (EUA). This EUA will remain in effect (meaning this test can be used) for the duration of the COVID-19 declaration under Section 564(b)(1) of the Act, 21 U.S.C. section 360bbb-3(b)(1), unless the authorization is terminated or revoked.  Performed at [facility], [address], [place], [place] [address]   POC SARS Coronavirus 2 Ag     Status: None   Collection Time: 12/22/21 12:14 AM  Result Value Ref Range   SARSCOV2ONAVIRUS 2 AG NEGATIVE NEGATIVE    Comment: (NOTE) SARS-CoV-2 antigen NOT DETECTED.   Negative results are presumptive.  Negative results do not preclude SARS-CoV-2 infection and should not be used as the sole basis for treatment or other patient management decisions, including infection  control decisions, particularly in the presence of clinical signs and  symptoms consistent with COVID-19, or in those who have been in contact with the virus.  Negative results must be combined with clinical observations, patient history, and epidemiological information. The expected result is Negative.  Fact Sheet for Patients: https://www.jennings-kim.com/  Fact Sheet for Healthcare Providers: https://alexander-rogers.biz/  This test is not yet approved or cleared by the [place] FDA and  has been authorized for detection and/or diagnosis of SARS-CoV-2 by FDA under an Emergency Use Authorization (EUA).  This EUA will remain in effect (meaning this test can be used) for the duration of  the COV ID-19 declaration under Section 564(b)(1) of the Act, 21 U.S.C. section 360bbb-3(b)(1), unless the authorization is terminated or revoked sooner.      Blood Alcohol level:  Lab Results  Component Value Date   ETH <10 12/22/2021    Metabolic Disorder Labs:  Lab Results  Component Value Date   HGBA1C 5.1 12/22/2021   MPG 99.67 12/22/2021   No  results found for: PROLACTIN Lab Results  Component Value Date   CHOL 167 12/22/2021   TRIG 74 12/22/2021   HDL 53 12/22/2021   CHOLHDL 3.2 12/22/2021   VLDL 15 12/22/2021   LDLCALC 99 12/22/2021    Current Medications: Current Facility-Administered Medications  Medication Dose Route Frequency Provider Last Rate Last Admin   acetaminophen (TYLENOL) tablet 650 mg  650 mg Oral Q6H PRN Lenard Lance, FNP       alum & mag hydroxide-simeth (MAALOX/MYLANTA) 200-200-20 MG/5ML suspension 30 mL  30 mL Oral  Q4H PRN Lenard Lance, FNP       haloperidol (HALDOL) tablet 5 mg  5 mg Oral Q6H PRN Eladio Dentremont, Shelbie Hutching, MD       And   benztropine (COGENTIN) tablet 1 mg  1 mg Oral Q6H PRN Jamal Pavon, Shelbie Hutching, MD       haloperidol lactate (HALDOL) injection 5 mg  5 mg Intramuscular Q6H PRN Ramey Ketcherside, Shelbie Hutching, MD       And   LORazepam (ATIVAN) injection 2 mg  2 mg Intramuscular Q6H PRN Kathlynn Swofford, Shelbie Hutching, MD       And   benztropine mesylate (COGENTIN) injection 1 mg  1 mg Intramuscular Q6H PRN Naureen Benton, Shelbie Hutching, MD       haloperidol (HALDOL) tablet 5 mg  5 mg Oral QHS Garcia Dalzell, Shelbie Hutching, MD       hydrOXYzine (ATARAX) tablet 25 mg  25 mg Oral TID PRN Lenard Lance, FNP       [START ON 12/24/2021] influenza vac split quadrivalent PF (FLUARIX) injection 0.5 mL  0.5 mL Intramuscular Tomorrow-1000 Ladona Ridgel, Cody W, PA-C       LORazepam (ATIVAN) tablet 1 mg  1 mg Oral Q8H PRN Kaevion Sinclair, Shelbie Hutching, MD       magnesium hydroxide (MILK OF MAGNESIA) suspension 30 mL  30 mL Oral Daily PRN Lenard Lance, FNP       nicotine polacrilex (NICORETTE) gum 2 mg  2 mg Oral PRN Roselle Locus, MD       [START ON 12/24/2021] sertraline (ZOLOFT) tablet 25 mg  25 mg Oral Daily Redell Bhandari, Shelbie Hutching, MD       thiamine tablet 100 mg  100 mg Oral Daily Marymargaret Kirker, Shelbie Hutching, MD   100 mg at 12/23/21 1658   traZODone (DESYREL) tablet 50 mg  50 mg Oral QHS PRN Lenard Lance, FNP       PTA Medications: No  medications prior to admission.    Musculoskeletal: Strength & Muscle Tone: within normal limits Gait & Station: normal Patient leans: N/A            Psychiatric Specialty Exam:  Presentation  General Appearance: Appropriate for Environment; Casual  Eye Contact:Good  Speech:Normal Rate  Speech Volume:Normal  Handedness:Right   Mood and Affect  Mood:Euthymic  Affect:Full Range   Thought Process  Thought Processes:Coherent  Duration of Psychotic Symptoms: -- ("my whole life")  Past Diagnosis of Schizophrenia or Psychoactive disorder: No data recorded Descriptions of Associations:Intact  Orientation:Full (Time, Place and Person)  Thought Content:Logical  Hallucinations:Hallucinations: Auditory Description of Auditory Hallucinations: "Anna Genre and Nadine Counts" "telling me to burn in hell, stuff like that"  Ideas of Reference:None  Suicidal Thoughts:Suicidal Thoughts: No SI Active Intent and/or Plan: With Intent  Homicidal Thoughts:Homicidal Thoughts: No   Sensorium  Memory:Immediate Fair; Recent Fair; Remote Fair  Judgment:Fair  Insight:Fair   Executive Functions  Concentration:Fair  Attention Span:Fair  Recall:Fair  Fund of Knowledge:Fair  Language:Fair   Psychomotor Activity  Psychomotor Activity:Psychomotor Activity: Normal   Assets  Assets:Physical Health; Leisure Time   Sleep  Sleep:Sleep: Fair    Physical Exam: Physical Exam Vitals and nursing note reviewed.  Constitutional:      Appearance: Normal appearance.  HENT:     Head: Normocephalic.  Eyes:     Extraocular Movements: Extraocular movements intact.  Pulmonary:     Effort: Pulmonary effort is normal.  Musculoskeletal:        General: Normal range of motion.  Cervical back: Normal range of motion.  Skin:    Comments: tattoos  Neurological:     General: No focal deficit present.     Mental Status: He is alert and oriented to person, place, and time.     Review of Systems  Constitutional:  Negative for fever.  HENT:  Negative for hearing loss.   Eyes:  Negative for blurred vision.  Respiratory:  Negative for cough.   Cardiovascular:  Positive for chest pain.  Gastrointestinal:  Negative for constipation, diarrhea, nausea and vomiting.  Genitourinary:  Negative for dysuria.  Musculoskeletal:  Positive for back pain, joint pain, myalgias and neck pain.  Skin:        Bilateral foot sores  Neurological:  Negative for dizziness and headaches.  Psychiatric/Behavioral:  Positive for depression, hallucinations and substance abuse. The patient is nervous/anxious and has insomnia.   Blood pressure 124/77, pulse 74, temperature 98.1 F (36.7 C), temperature source Oral, resp. rate 18, height 6' (1.829 m), weight 74.8 kg, SpO2 100 %. Body mass index is 22.38 kg/m.  Treatment Plan Summary: Daily contact with patient to assess and evaluate symptoms and progress in treatment and Medication management  Observation Level/Precautions:  Detox 15 minute checks  Laboratory:  Chemistry Profile HbAIC Vitamin B-12  Psychotherapy:  group, milieu, 1:1  Medications:  haldol, zoloft  Consultations:  n/a  Discharge Concerns:  pcp  Estimated LOS: 5-7  Other:     Physician Treatment Plan for Primary Diagnosis: Major depressive disorder, recurrent episode, severe, with psychosis (HCC) Long Term Goal(s): Improvement in symptoms so as ready for discharge  Short Term Goals: Ability to identify changes in lifestyle to reduce recurrence of condition will improve, Ability to verbalize feelings will improve, Ability to disclose and discuss suicidal ideas, Ability to demonstrate self-control will improve, Ability to identify and develop effective coping behaviors will improve, Ability to maintain clinical measurements within normal limits will improve, Compliance with prescribed medications will improve, and Ability to identify triggers associated with substance  abuse/mental health issues will improve  Physician Treatment Plan for Secondary Diagnosis: Principal Problem:   Major depressive disorder, recurrent episode, severe, with psychosis (HCC) Active Problems:   Moderate alcohol use disorder (HCC)   Moderate cannabis use disorder (HCC)   Nicotine use disorder   Anxiety disorder, unspecified  Long Term Goal(s): Improvement in symptoms so as ready for discharge  Short Term Goals: Ability to identify changes in lifestyle to reduce recurrence of condition will improve, Ability to verbalize feelings will improve, Ability to disclose and discuss suicidal ideas, Ability to demonstrate self-control will improve, Ability to identify and develop effective coping behaviors will improve, Ability to maintain clinical measurements within normal limits will improve, Compliance with prescribed medications will improve, and Ability to identify triggers associated with substance abuse/mental health issues will improve  I certify that inpatient services furnished can reasonably be expected to improve the patient's condition.    Roselle Locus, MD 3/9/20235:56 PM

## 2021-12-23 NOTE — Progress Notes (Signed)
?   12/23/21 0010  ?Psych Admission Type (Psych Patients Only)  ?Admission Status Voluntary  ?Psychosocial Assessment  ?Patient Complaints Depression;Self-harm thoughts;Anxiety;Insomnia  ?Eye Contact Fair  ?Facial Expression Anxious  ?Affect Anxious  ?Speech Logical/coherent  ?Interaction Assertive  ?Motor Activity Other (Comment) ?(WDL)  ?Appearance/Hygiene Unremarkable  ?Behavior Characteristics Cooperative;Anxious;Calm  ?Mood Anxious  ?Aggressive Behavior  ?Targets Property  ?Type of Behavior Striking out  ?Effect No apparent injury  ?Thought Process  ?Coherency Circumstantial  ?Content WDL  ?Delusions None reported or observed  ?Perception Hallucinations  ?Hallucination Auditory;Visual  ?Judgment Poor  ?Confusion None  ?Danger to Self  ?Current suicidal ideation? Passive  ?Self-Injurious Behavior No self-injurious ideation or behavior indicators observed or expressed   ?Agreement Not to Harm Self Yes  ?Description of Agreement Verbal Contract  ?Danger to Others  ?Danger to Others None reported or observed  ? ? ?

## 2021-12-23 NOTE — [facility] Group Notes (Signed)
PsychoEducational Group Note- ?The patients were educated on identifying negative behavioral patterns, and ways that negative patterns can impact mental health. A poem was read by ''Portia Nelson, there's a hole in my sidewalk'' in regards to negative patterns. The patients were then asked to participate in sharing and identifying their own negative patterns. A second poem was shared by Jo Camacho ''the owl and the chimpanzee'' in which patients are asked to recognize fight or flight patterns with their own anxiety. The patient attended and was appropriate. ?

## 2021-12-23 NOTE — Progress Notes (Signed)
Dar Note: Patient presents with anxious affect and depressed mood.  Report passive SI but verbally contracts for safety.  Patient was irritable and tearful after phone conversation with soon to be ex-wife.  Haldol and Ativan given x 1 with good effect.  Patient visible in milieu interacting with peers.  Routine safety checks maintained.  Patient is safe on and off the unit. ?

## 2021-12-23 NOTE — BHH Suicide Risk Assessment (Signed)
[facility] Admission Suicide Risk Assessment ? ? ?Nursing information obtained from:  Patient ?Demographic factors:  Male, Low socioeconomic status, Unemployed, Divorced or widowed ?Current Mental Status:  Self-harm thoughts, Suicidal ideation indicated by patient ?Loss Factors:  Legal issues, Financial problems / change in socioeconomic status, Loss of significant relationship ?Historical Factors:  NA ?Risk Reduction Factors:  Sense of responsibility to family, Positive social support, Living with another person, especially a relative, Responsible for children under 53 years of age ? ?Total Time spent with patient: 45 minutes ?Principal Problem: Major depressive disorder, recurrent episode, severe, with psychosis (HCC) ?Diagnosis:  Principal Problem: ?  Major depressive disorder, recurrent episode, severe, with psychosis (HCC) ?Active Problems: ?  Moderate alcohol use disorder (HCC) ?  Moderate cannabis use disorder (HCC) ?  Nicotine use disorder ?  Anxiety disorder, unspecified ? ?Subjective Data: Tyler [name] is a 36 YO M who reports a psychiatric history of bipolar schizophrenia and chief complaint of "constant suicidal thoughts and numerous personalities". He states that he has been depressed for his "whole life". He sleeps mostly during the day and is up at night. His appetite is up and down. He hears the voices of "jack, Gabriel Rung and Nadine Counts" but finds that "Ree Kida is the one that want me to do it [die]" and tells him to kill himself or burn in hell. He denies wanting to die to this interviewer, and denies wanting to harm others. He has been under a lot of stress recently due to going through a divorce, and has been on the phone with his soon-to-be ex much of the morning. He denies delusions, paranoia. He is willing to take medications. He has been to [facility] in the past and taken seroquel, and round that this medication was very helpful.  ? Tyler [name] is currently using 1-2 40 ounce beers per day as well as cannabis daily. He  denies other illicit drug use. He does not regard alcohol or cannabis use as a significant problem at this time.  ? ?Continued Clinical Symptoms:  ?Alcohol Use Disorder Identification Test Final Score (AUDIT): 2 ?The "Alcohol Use Disorders Identification Test", Guidelines for Use in Primary Care, Second Edition.  World Science writer [facility]). ?Score between 0-7:  no or low risk or alcohol related problems. ?Score between 8-15:  moderate risk of alcohol related problems. ?Score between 16-19:  high risk of alcohol related problems. ?Score 20 or above:  warrants further diagnostic evaluation for alcohol dependence and treatment. ? ? ?CLINICAL FACTORS:  ? Bipolar Disorder:   Depressive phase ?Depression:   Delusional ?Alcohol/Substance Abuse/Dependencies ?Schizophrenia:   Command hallucinatons ?Less than 41 years old ?More than one psychiatric diagnosis ?Currently Psychotic ?Previous Psychiatric Diagnoses and Treatments ? ? ?Musculoskeletal: ?Strength & Muscle Tone: within normal limits ?Gait & Station: normal ?Patient leans: N/A ? ?Psychiatric Specialty Exam: ? ?Presentation  ?General Appearance: Appropriate for Environment; Casual ? ?Eye Contact:Good ? ?Speech:Normal Rate ? ?Speech Volume:Normal ? ?Handedness:Right ? ? ?Mood and Affect  ?Mood:Euthymic ? ?Affect:Full Range ? ? ?Thought Process  ?Thought Processes:Coherent ? ?Descriptions of Associations:Intact ? ?Orientation:Full (Time, Place and Person) ? ?Thought Content:Logical ? ?History of Schizophrenia/Schizoaffective disorder:No data recorded ?Duration of Psychotic Symptoms:-- ("my whole life") ? ?Hallucinations:Hallucinations: Auditory ?Description of Auditory Hallucinations: "Anna Genre and Nadine Counts" "telling me to burn in hell, stuff like that" ? ?Ideas of Reference:None ? ?Suicidal Thoughts:Suicidal Thoughts: No ?SI Active Intent and/or Plan: With Intent ? ?Homicidal Thoughts:Homicidal Thoughts: No ? ? ?Sensorium  ?Memory:Immediate Fair; Recent Fair; Remote  Fair ? ?  Judgment:Fair ? ?Insight:Fair ? ? ?Executive Functions  ?Concentration:Fair ? ?Attention Span:Fair ? ?Recall:Fair ? ?Fund of Knowledge:Fair ? ?Language:Fair ? ? ?Psychomotor Activity  ?Psychomotor Activity:Psychomotor Activity: Normal ? ? ?Assets  ?Assets:Physical Health; Leisure Time ? ? ?Sleep  ?Sleep:Sleep: Fair ? ? ? ?Physical Exam: ?Physical Exam ?Vitals and nursing note reviewed.  ?Constitutional:   ?   Appearance: Normal appearance.  ?HENT:  ?   Head: Normocephalic.  ?Eyes:  ?   Extraocular Movements: Extraocular movements intact.  ?Pulmonary:  ?   Effort: Pulmonary effort is normal.  ?Musculoskeletal:     ?   General: Normal range of motion.  ?   Cervical back: Normal range of motion.  ?Skin: ?   Comments: tattoos  ?Neurological:  ?   General: No focal deficit present.  ?   Mental Status: He is alert and oriented to person, place, and time.  ? ?Review of Systems  ?Constitutional:  Negative for fever.  ?HENT:  Negative for hearing loss.   ?Eyes:  Negative for blurred vision.  ?Respiratory:  Negative for cough.   ?Cardiovascular:  Positive for chest pain.  ?Gastrointestinal:  Negative for constipation, diarrhea, nausea and vomiting.  ?Genitourinary:  Negative for dysuria.  ?Musculoskeletal:  Positive for back pain, joint pain, myalgias and neck pain.  ?Skin:   ?     Bilateral foot sores  ?Neurological:  Negative for dizziness and headaches.  ?Psychiatric/Behavioral:  Positive for depression, hallucinations and substance abuse. The patient is nervous/anxious and has insomnia.   ?Blood pressure 124/77, pulse 74, temperature 98.1 ?F (36.7 ?C), temperature source Oral, resp. rate 18, height 6' (1.829 m), weight 74.8 kg, SpO2 100 %. Body mass index is 22.38 kg/m?. ? ? ?COGNITIVE FEATURES THAT CONTRIBUTE TO RISK:  ?None   ? ?SUICIDE RISK:  ? Mild:  Suicidal ideation of limited frequency, intensity, duration, and specificity.  There are no identifiable plans, no associated intent, mild dysphoria and related  symptoms, good self-control (both objective and subjective assessment), few other risk factors, and identifiable protective factors, including available and accessible social support. ? ?PLAN OF CARE:  ?Safety and Monitoring ?--  Admission to inpatient psychiatric unit for safety, stabilization and treatment ?-- Daily contact with patient to assess and evaluate symptoms and progress in treatment ?-- Patient's case to be discussed in multi-disciplinary team meeting. ?-- Patient will be encouraged to participate in the therapeutic group milieu. ?-- Observation Level : q15 minute checks ?-- Vital signs:  q12 hours ?-- Precautions: suicide. ? Plan  ?-Monitor Vitals. ?-Monitor for thoughts of harm to self or others ?-Monitor for psychosis, disorganization or changes to cognition ?-Monitor for withdrawal symptoms. ?-Monitor for medication side effects. ? ?Labs/Studies: ?Nutrition, RPR ? ?Medications: ?Haldol, CIWA with lorazepam, zoloft  ? ?I certify that inpatient services furnished can reasonably be expected to improve the patient's condition.  ? ?Roselle Locus, MD ?12/23/2021, 5:44 PM ? ?

## 2021-12-23 NOTE — Tx Team (Signed)
Initial Treatment Plan ?12/23/2021 ?12:47 AM ?[name] ?[id number] ? ? ? ?PATIENT STRESSORS: ?Financial difficulties   ?Loss of child custody   ?Marital or family conflict   ?Occupational concerns   ? ? ?PATIENT STRENGTHS: ?Capable of independent living  ?Motivation for treatment/growth  ?Physical Health  ?Supportive family/friends  ?Work skills  ? ? ?PATIENT IDENTIFIED PROBLEMS: ?Divorce  ?Legal issues with child custody  ?  ?  ?  ?  ?  ?  ?  ?  ? ?DISCHARGE CRITERIA:  ?Improved stabilization in mood, thinking, and/or behavior ?Motivation to continue treatment in a less acute level of care ?Reduction of life-threatening or endangering symptoms to within safe limits ? ?PRELIMINARY DISCHARGE PLAN: ?Return to previous living arrangement ?Return to previous work or school arrangements ? ?PATIENT/FAMILY INVOLVEMENT: ?This treatment plan has been presented to and reviewed with the patient, Tyler [name].  The patient have been given the opportunity to ask questions and make suggestions. ? ?Azucena Cecil, RN ?12/23/2021, 12:47 AM ?

## 2021-12-23 NOTE — Progress Notes (Signed)
Admission Note:  ? ?Tyler [name] is a 82 male, presented voluntarily to [facility] IP Adult with suicidal ideation,  and auditory and visual hallucination.  Per the patient he hears voices telling him to kill himself every hour on the hour.  He also report suicidal ideation "hang self,  step into traffic".  Pt did report he has three different personality. Pt states he also sees people others cannot see.  ? ? ?Admission Assessment: ? ?Pt cooperative and calm. Pt verbalized he is recently having a divorce with his partner that is trying to take his daughter away from him. Pt states he also has financial issues and had to move in with his parents. PT confirmed having auditory and visual hallucinations about people telling him to kill himself. He stated he was born seeing and hearing things that others cannot see or hear, and he doesn't like when people thinks he is crazy. Pt also states he has multiple personalities.Pt verbalized passive SI but contracts for safety.   ? ?Skin was assessed and found to be clear of any abnormal marks apart from tattoos. Pt searched and no contraband found, POC and unit policies explained and understanding verbalized. Consents obtained. Food and fluids offered, and accepted. Pt had no additional questions or concerns.   ?

## 2021-12-24 ENCOUNTER — Encounter (HOSPITAL_COMMUNITY): Payer: Self-pay

## 2021-12-24 DIAGNOSIS — E559 Vitamin D deficiency, unspecified: Secondary | ICD-10-CM | POA: Diagnosis present

## 2021-12-24 DIAGNOSIS — B353 Tinea pedis: Secondary | ICD-10-CM | POA: Diagnosis present

## 2021-12-24 DIAGNOSIS — F609 Personality disorder, unspecified: Secondary | ICD-10-CM | POA: Diagnosis present

## 2021-12-24 LAB — RPR: RPR Ser Ql: NONREACTIVE

## 2021-12-24 LAB — GLUCOSE, CAPILLARY: Glucose-Capillary: 112 mg/dL — ABNORMAL HIGH (ref 70–99)

## 2021-12-24 LAB — VITAMIN D 25 HYDROXY (VIT D DEFICIENCY, FRACTURES): Vit D, 25-Hydroxy: 9.1 ng/mL — ABNORMAL LOW (ref 30–100)

## 2021-12-24 LAB — MAGNESIUM: Magnesium: 2.2 mg/dL (ref 1.7–2.4)

## 2021-12-24 LAB — VITAMIN B12: Vitamin B-12: 326 pg/mL (ref 180–914)

## 2021-12-24 MED ORDER — NYSTATIN 100000 UNIT/GM EX POWD
Freq: Two times a day (BID) | CUTANEOUS | Status: DC
  Filled 2021-12-24: qty 15

## 2021-12-24 MED ORDER — SERTRALINE HCL 50 MG PO TABS
50.0000 mg | ORAL_TABLET | Freq: Every day | ORAL | Status: DC
Start: 2021-12-25 — End: 2021-12-26
  Administered 2021-12-25 – 2021-12-26 (×2): 50 mg via ORAL
  Filled 2021-12-24 (×3): qty 1

## 2021-12-24 MED ORDER — VITAMIN D (ERGOCALCIFEROL) 1.25 MG (50000 UNIT) PO CAPS
50000.0000 [IU] | ORAL_CAPSULE | ORAL | Status: DC
  Administered 2021-12-25: 50000 [IU] via ORAL
  Filled 2021-12-24: qty 1

## 2021-12-24 NOTE — Progress Notes (Signed)
?   12/24/21 1500  ?Psych Admission Type (Psych Patients Only)  ?Admission Status Voluntary  ?Psychosocial Assessment  ?Patient Complaints Anxiety  ?Eye Contact Fair  ?Facial Expression Anxious  ?Affect Preoccupied  ?Speech Logical/coherent  ?Interaction Assertive  ?Motor Activity Slow  ?Appearance/Hygiene Unremarkable  ?Behavior Characteristics Cooperative  ?Mood Preoccupied;Depressed  ?Thought Process  ?Coherency Circumstantial  ?Content WDL  ?Delusions None reported or observed  ?Perception Hallucinations  ?Hallucination Auditory  ?Judgment Poor  ?Confusion None  ?Danger to Self  ?Current suicidal ideation? Passive  ?Agreement Not to Harm Self Yes  ?Description of Agreement Verbal  ?Danger to Others  ?Danger to Others None reported or observed  ? ?Dar Note: Patient presents with irritable mood and anxious affect.  Reports passive SI and command auditory hallucination.  Patient stated,  "I'm trying to kill Joe, one of the voices telling me to kill myself."  Patient encouraged to be visible in milieu and Vistaril given for anxiety with good effect.  Patient observed interacting with peers in the dayroom.  Patient is safe on and off the unit.    ?

## 2021-12-24 NOTE — BHH Suicide Risk Assessment (Signed)
BHH INPATIENT:  Family/Significant Other Suicide Prevention Education ? ?Suicide Prevention Education:  ?Education Completed; Jeanine Luz [phone]), has been identified by the patient as the family member/significant other with whom the patient will be residing, and identified as the person(s) who will aid the patient in the event of a mental health crisis (suicidal ideations/suicide attempt).  With written consent from the patient, the family member/significant other has been provided the following suicide prevention education, prior to the and/or following the discharge of the patient. ? ?Per cousin this patient has been struggling with mental health issues since he was a child.  ?She states he called her last year with suicidal thoughts due to issues with his wife and negativity of others. Had to IVC him at that point. He was found with a shoe string around his neck.  ?Have been talking him out of suicide for the past three months. He is not always open to receive help. She states his ex-wife is playing mind tricks with him and using their daughter as a way to "bait" him. Daughter is a protective factor for him but can also cause agitation with dealing with child's mother.  ? ?Patient is able to return to live with parents at discharge and she has no safety concerns for him returning to the community. ? ? ?The suicide prevention education provided includes the following: ?Suicide risk factors ?Suicide prevention and interventions ?National Suicide Hotline telephone number ?[facility] assessment telephone number ?[facility] Emergency Assistance 911 ?[place] and/or Residential Mobile Crisis Unit telephone number ? ?Request made of family/significant other to: ?Remove weapons (e.g., guns, rifles, knives), all items previously/currently identified as safety concern.   ?Remove drugs/medications (over-the-counter, prescriptions, illicit drugs), all items previously/currently  identified as a safety concern. ? ?The family member/significant other verbalizes understanding of the suicide prevention education information provided.  The family member/significant other agrees to remove the items of safety concern listed above. ? ?Conna Terada A Shahid Flori ?12/24/2021, 1:59 PM ?

## 2021-12-24 NOTE — Progress Notes (Signed)
?   12/24/21 0500  ?Sleep  ?Number of Hours 6  ? ? ?

## 2021-12-24 NOTE — Group Note (Signed)
LCSW Group Therapy Note ? ? ?Group Date: 12/24/2021 ?Start Time: 1300 ?End Time: 1400 ? ?Type of Therapy and Topic:  Group Therapy:  Self-Esteem ?  ?Participation Level:  Did Not Attend ? ?Description of Group: ?This group addressed positive self-esteem. Patients were given a worksheet with a blank shield. Patients were asked what a shield is and when it is used. Patients were asked to list, draw, or write protective factors in the their lives on their shields. Patients discussed the words, ideas and drawings that they put on their shield. Patients were encouraged to have a daily reflection of positive characteristics/ protective factors. ? ?Therapeutic Goals ?Patient will verbalize two of their positive qualities ?Patient will demonstrate insight but naming social supports in their lives ?Patient will verbalize their feelings when voicing positive self affirmations and when voicing positive affirmations of others ?Patients will discuss the potential positive impact on their wellness/recovery of focusing on positive traits of self and others. ? ?Summary of Patient Progress:  Did not attend  ? ?Aram Beecham, LCSWA ?12/24/2021  1:42 PM   ? ?

## 2021-12-24 NOTE — BH IP Treatment Plan (Signed)
Interdisciplinary Treatment and Diagnostic Plan Update  12/24/2021 Time of Session: 11:10am Coburn Knaus MRN: [id number]  Principal Diagnosis: Major depressive disorder, recurrent episode, severe, with psychosis ([place])  Secondary Diagnoses: Principal Problem:   Major depressive disorder, recurrent episode, severe, with psychosis ([place]) Active Problems:   Moderate alcohol use disorder (HCC)   Moderate cannabis use disorder (HCC)   Nicotine use disorder   Anxiety disorder, unspecified   Personality disorder ([place])   Current Medications:  Current Facility-Administered Medications  Medication Dose Route Frequency Provider Last Rate Last Admin   acetaminophen (TYLENOL) tablet 650 mg  650 mg Oral Q6H PRN Lucky Rathke, FNP       alum & mag hydroxide-simeth (MAALOX/MYLANTA) 200-200-20 MG/5ML suspension 30 mL  30 mL Oral Q4H PRN Lucky Rathke, FNP       haloperidol (HALDOL) tablet 5 mg  5 mg Oral Q6H PRN Hill, Jackie Plum, MD       And   benztropine (COGENTIN) tablet 1 mg  1 mg Oral Q6H PRN Hill, Jackie Plum, MD       haloperidol lactate (HALDOL) injection 5 mg  5 mg Intramuscular Q6H PRN Hill, Jackie Plum, MD       And   LORazepam (ATIVAN) injection 2 mg  2 mg Intramuscular Q6H PRN Hill, Jackie Plum, MD       And   benztropine mesylate (COGENTIN) injection 1 mg  1 mg Intramuscular Q6H PRN Hill, Jackie Plum, MD       haloperidol (HALDOL) tablet 5 mg  5 mg Oral QHS Hill, Jackie Plum, MD   5 mg at 12/23/21 2048   hydrOXYzine (ATARAX) tablet 25 mg  25 mg Oral TID PRN Lucky Rathke, FNP   25 mg at 12/24/21 1142   LORazepam (ATIVAN) tablet 1 mg  1 mg Oral Q8H PRN Hill, Jackie Plum, MD       magnesium hydroxide (MILK OF MAGNESIA) suspension 30 mL  30 mL Oral Daily PRN Lucky Rathke, FNP       nicotine polacrilex (NICORETTE) gum 2 mg  2 mg Oral PRN Maida Sale, MD       [START ON 12/25/2021] sertraline (ZOLOFT) tablet 50 mg  50 mg Oral Daily Hill, Jackie Plum, MD       thiamine tablet 100 mg  100 mg Oral Daily Hill, Jackie Plum, MD   100 mg at 12/24/21 1142   traZODone (DESYREL) tablet 50 mg  50 mg Oral QHS PRN Lucky Rathke, FNP   50 mg at 12/23/21 2048   PTA Medications: No medications prior to admission.    Patient Stressors: Financial difficulties   Loss of child custody   Marital or family conflict   Occupational concerns    Patient Strengths: Capable of independent living  Motivation for treatment/growth  Physical Health  Supportive family/friends  Work skills   Treatment Modalities: Medication Management, Group therapy, Case management,  1 to 1 session with clinician, Psychoeducation, Recreational therapy.   Physician Treatment Plan for Primary Diagnosis: Major depressive disorder, recurrent episode, severe, with psychosis ([place]) Long Term Goal(s): Improvement in symptoms so as ready for discharge   Short Term Goals: Ability to identify changes in lifestyle to reduce recurrence of condition will improve Ability to verbalize feelings will improve Ability to disclose and discuss suicidal ideas Ability to demonstrate self-control will improve Ability to identify and develop effective coping behaviors will improve Ability to maintain clinical measurements within normal limits will improve Compliance with  prescribed medications will improve Ability to identify triggers associated with substance abuse/mental health issues will improve  Medication Management: Evaluate patient's response, side effects, and tolerance of medication regimen.  Therapeutic Interventions: 1 to 1 sessions, Unit Group sessions and Medication administration.  Evaluation of Outcomes: Not Met  Physician Treatment Plan for Secondary Diagnosis: Principal Problem:   Major depressive disorder, recurrent episode, severe, with psychosis ([place]) Active Problems:   Moderate alcohol use disorder (HCC)   Moderate cannabis use disorder (HCC)   Nicotine use  disorder   Anxiety disorder, unspecified   Personality disorder ([place])  Long Term Goal(s): Improvement in symptoms so as ready for discharge   Short Term Goals: Ability to identify changes in lifestyle to reduce recurrence of condition will improve Ability to verbalize feelings will improve Ability to disclose and discuss suicidal ideas Ability to demonstrate self-control will improve Ability to identify and develop effective coping behaviors will improve Ability to maintain clinical measurements within normal limits will improve Compliance with prescribed medications will improve Ability to identify triggers associated with substance abuse/mental health issues will improve     Medication Management: Evaluate patient's response, side effects, and tolerance of medication regimen.  Therapeutic Interventions: 1 to 1 sessions, Unit Group sessions and Medication administration.  Evaluation of Outcomes: Not Met   RN Treatment Plan for Primary Diagnosis: Major depressive disorder, recurrent episode, severe, with psychosis ([place]) Long Term Goal(s): Knowledge of disease and therapeutic regimen to maintain health will improve  Short Term Goals: Ability to remain free from injury will improve, Ability to verbalize frustration and anger appropriately will improve, Ability to demonstrate self-control, Ability to participate in decision making will improve, Ability to verbalize feelings will improve, Ability to disclose and discuss suicidal ideas, Ability to identify and develop effective coping behaviors will improve, and Compliance with prescribed medications will improve  Medication Management: RN will administer medications as ordered by provider, will assess and evaluate patient's response and provide education to patient for prescribed medication. RN will report any adverse and/or side effects to prescribing provider.  Therapeutic Interventions: 1 on 1 counseling sessions, Psychoeducation,  Medication administration, Evaluate responses to treatment, Monitor vital signs and CBGs as ordered, Perform/monitor CIWA, COWS, AIMS and Fall Risk screenings as ordered, Perform wound care treatments as ordered.  Evaluation of Outcomes: Not Met   LCSW Treatment Plan for Primary Diagnosis: Major depressive disorder, recurrent episode, severe, with psychosis ([place]) Long Term Goal(s): Safe transition to appropriate next level of care at discharge, Engage patient in therapeutic group addressing interpersonal concerns.  Short Term Goals: Engage patient in aftercare planning with referrals and resources, Increase social support, Increase ability to appropriately verbalize feelings, Increase emotional regulation, Facilitate acceptance of mental health diagnosis and concerns, Facilitate patient progression through stages of change regarding substance use diagnoses and concerns, Identify triggers associated with mental health/substance abuse issues, and Increase skills for wellness and recovery  Therapeutic Interventions: Assess for all discharge needs, 1 to 1 time with Social worker, Explore available resources and support systems, Assess for adequacy in community support network, Educate family and significant other(s) on suicide prevention, Complete Psychosocial Assessment, Interpersonal group therapy.  Evaluation of Outcomes: Not Met   Progress in Treatment: Attending groups: Yes. Participating in groups: Yes. Taking medication as prescribed: Yes. Toleration medication: Yes. Family/Significant other contact made: No, will contact:  CSW will assess and identify a support person Patient understands diagnosis: Yes. Discussing patient identified problems/goals with staff: Yes. Medical problems stabilized or resolved: Yes. Denies suicidal/homicidal ideation:  Yes. Issues/concerns per patient self-inventory: No. Other: none  New problem(s) identified: No, Describe:  none  New Short Term/Long Term  Goal(s):   medication stabilization, elimination of SI thoughts, development of comprehensive mental wellness plan.    Patient Goals:  "get rid of voices, get back to normal life"  Discharge Plan or Barriers: Patient recently admitted. CSW will continue to follow and assess for appropriate referrals and possible discharge planning.    Reason for Continuation of Hospitalization: Anxiety Delusions  Hallucinations Medication stabilization Suicidal ideation  Estimated Length of Stay: 3-5 days   Scribe for Treatment Team: Zachery Conch, LCSW 12/24/2021 2:47 PM

## 2021-12-24 NOTE — Progress Notes (Signed)
[facility]  12/24/2021 6:21 PM Tyler CoopDerek [name]  MRN:  [id number]  History of Present Illness: Tyler CoopDerek [name] is a 36 YO M who reports a psychiatric history of bipolar schizophrenia and chief complaint of "constant suicidal thoughts and numerous personalities". He states that he has been depressed for his "whole life". He sleeps mostly during the day and is up at night. His appetite is up and down. He hears the voices of "jack, Gabriel RungJoe and Nadine CountsBob" but finds that "Ree KidaJack is the one that want me to do it [die]" and tells him to kill himself or burn in hell. He denies wanting to die to this interviewer, and denies wanting to harm others. He has been under a lot of stress recently due to going through a divorce, and has been on the phone with his soon-to-be ex much of the morning. He denies delusions, paranoia. He is willing to take medications. He has been [facility] in the past and taken seroquel, and round that this medication was very helpful.   24 hour EMR reviewed. Case discussed in progression rounds. Per nursing report, patient had trazodone overnight for sleep, with effect. Vistaril today for anxiety, with effect.   Subjective:  Tyler PiccoloDerek has been seen at several intervals over the course of the day due to lability, irritability and confrontational nature. He was upset this morning because he wanted to be woken up for medications, since he came back after breakfast and took a nap in his room. He was upset that the nurse did not personally come find him. He was upset that he would be getting them late. I attempted to discuss with him that he was expected to come to the window in the morning with the other patients, but he was very angry and walked away. After lunch he was also very angry about another incident where he was thinking about his daughter and his wife, and had a difficult time cooling down. He had been on and off the phone with her most of this day and yesterday. Despite this apparent trigger for  anger, he continues to pursue contact. He states that he has "personalities" in his head that tell him to die. His descriptions of 3 personalities are fully developed and not consistent with auditory hallucinations, and there is no evidence of RIS or disorganization. No thoughts of harm to self or others. Significant blaming and splitting behaviors noted. He complaints of bilateral foot pain, states he has 'holes" in the skin. On exam he has cracks consistent with tinea pedis.   Principal Problem: Major depressive disorder, recurrent episode, severe, with psychosis (HCC) Diagnosis: Principal Problem:   Major depressive disorder, recurrent episode, severe, with psychosis (HCC) Active Problems:   Moderate alcohol use disorder (HCC)   Moderate cannabis use disorder (HCC)   Nicotine use disorder   Anxiety disorder, unspecified   Personality disorder (HCC)   Tinea pedis of both feet  Total Time spent with patient: 20 minutes  Past Psychiatric History: [facility], has been on Seroquel. Has previous attempt to hand self. Reports previous diagnosis of bipolar and schizophrenia  Past Medical History:  Past Medical History:  Diagnosis Date   Anxiety    panic and SOB    Past Surgical History:  Procedure Laterality Date   IRRIGATION AND DEBRIDEMENT ABSCESS N/A 08/04/2020   Procedure: IRRIGATION AND DEBRIDEMENT PERINEAL ABSCESS, Lovett SoxYSTOSCOPY, URETHRAGRAM;  Surgeon: Bjorn PippinWrenn, John, MD;  Location: [facility];  Service: Urology;  Laterality: N/A;  IRRIGATION AND DEBRIDEMENT ABSCESS N/A 10/20/2020   Procedure: IRRIGATION AND DEBRIDEMENT PERINEAL  ABSCESS;  Surgeon: Sebastian Ache, MD;  Location: [facility];  Service: Urology;  Laterality: N/A;   IRRIGATION AND DEBRIDEMENT BUTTOCKS N/A 10/28/2020   Procedure: IRRIGATION AND DEBRIDEMENT PERINEAL ABCESS;  Surgeon: Abigail Miyamoto, MD;  Location: [facility];  Service: General;  Laterality: N/A;  packing 2" gauze   TRANSURETHRAL RESECTION OF PROSTATE   07/21/2020   Procedure: TRANSURETHRAL RESECTION OF THE PROSTATE (TURP);  Surgeon: Rene Paci, MD;  Location: [facility];  Service: Urology;;   TRANSURETHRAL RESECTION OF PROSTATE N/A 10/20/2020   Procedure: CYSTOSCOPY, TRANSURETHRAL RESECTION OF THE PROSTATE ABCESS(TURP);  Surgeon: Sebastian Ache, MD;  Location: [facility];  Service: Urology;  Laterality: N/A;   Family History: History reviewed. No pertinent family history. Family Psychiatric  History: none Social History:  Social History   Substance and Sexual Activity  Alcohol Use Yes     Social History   Substance and Sexual Activity  Drug Use Yes   Types: Marijuana    Social History   Socioeconomic History   Marital status: Single    Spouse name: Not on file   Number of children: Not on file   Years of education: Not on file   Highest education level: Not on file  Occupational History   Not on file  Tobacco Use   Smoking status: Every Day    Packs/day: 1.00    Types: Cigarettes   Smokeless tobacco: Never  Vaping Use   Vaping Use: Never used  Substance and Sexual Activity   Alcohol use: Yes   Drug use: Yes    Types: Marijuana   Sexual activity: Not Currently  Other Topics Concern   Not on file  Social History Narrative   Not on file   Social Determinants of Health   Financial Resource Strain: Not on file  Food Insecurity: Not on file  Transportation Needs: Not on file  Physical Activity: Not on file  Stress: Not on file  Social Connections: Not on file   Additional Social History:                         Sleep: Good  Appetite:  Good  Current Medications: Current Facility-Administered Medications  Medication Dose Route Frequency Provider Last Rate Last Admin   acetaminophen (TYLENOL) tablet 650 mg  650 mg Oral Q6H PRN Lenard Lance, FNP       alum & mag hydroxide-simeth (MAALOX/MYLANTA) 200-200-20 MG/5ML suspension 30 mL  30 mL Oral Q4H PRN Lenard Lance, FNP       haloperidol  (HALDOL) tablet 5 mg  5 mg Oral Q6H PRN Frazer Rainville, Shelbie Hutching, MD       And   benztropine (COGENTIN) tablet 1 mg  1 mg Oral Q6H PRN Michaela Broski, Shelbie Hutching, MD       haloperidol lactate (HALDOL) injection 5 mg  5 mg Intramuscular Q6H PRN Kaden Daughdrill, Shelbie Hutching, MD       And   LORazepam (ATIVAN) injection 2 mg  2 mg Intramuscular Q6H PRN Samiha Denapoli, Shelbie Hutching, MD       And   benztropine mesylate (COGENTIN) injection 1 mg  1 mg Intramuscular Q6H PRN Tamora Huneke, Shelbie Hutching, MD       haloperidol (HALDOL) tablet 5 mg  5 mg Oral QHS Falynn Ailey, Shelbie Hutching, MD   5 mg at 12/23/21 2048   hydrOXYzine (ATARAX) tablet 25 mg  25  mg Oral TID PRN Lenard Lance, FNP   25 mg at 12/24/21 1142   LORazepam (ATIVAN) tablet 1 mg  1 mg Oral Q8H PRN Roselle Locus, MD       magnesium hydroxide (MILK OF MAGNESIA) suspension 30 mL  30 mL Oral Daily PRN Lenard Lance, FNP       nicotine polacrilex (NICORETTE) gum 2 mg  2 mg Oral PRN Merrilee Ancona, Shelbie Hutching, MD       nystatin (MYCOSTATIN/NYSTOP) topical powder   Topical BID Tinley Rought, Shelbie Hutching, MD       [START ON 12/25/2021] sertraline (ZOLOFT) tablet 50 mg  50 mg Oral Daily Koen Antilla, Shelbie Hutching, MD       thiamine tablet 100 mg  100 mg Oral Daily Ekansh Sherk, Shelbie Hutching, MD   100 mg at 12/24/21 1142   traZODone (DESYREL) tablet 50 mg  50 mg Oral QHS PRN Lenard Lance, FNP   50 mg at 12/23/21 2048    Lab Results:  Results for orders placed or performed during the hospital encounter of 12/22/21 (from the past 48 hour(s))  VITAMIN D 25 Hydroxy (Vit-D Deficiency, Fractures)     Status: Abnormal   Collection Time: 12/24/21  6:26 AM  Result Value Ref Range   Vit D, 25-Hydroxy 9.10 (L) 30 - 100 ng/mL    Comment: (NOTE) Vitamin D deficiency has been defined by the Institute of Medicine  and an Endocrine Society practice guideline as a level of serum 25-OH  vitamin D less than 20 ng/mL (1,2). The Endocrine Society went on to  further define vitamin D insufficiency as a  level between 21 and 29  ng/mL (2).  1. IOM (Institute of Medicine). 2010. Dietary reference intakes for  calcium and D. [place]: [employer]. 2. Holick MF, [place] [address] [place], Bischoff-Ferrari HA, et al. Evaluation,  treatment, and prevention of vitamin D deficiency: an Endocrine  Society clinical practice guideline, JCEM. 2011 Jul; 96(7): 1911-30.  Performed at [facility], [address] [place], [place] [address]   Vitamin B12     Status: None   Collection Time: 12/24/21  6:26 AM  Result Value Ref Range   Vitamin B-12 326 180 - 914 pg/mL    Comment: (NOTE) This assay is not validated for testing neonatal or myeloproliferative syndrome specimens for Vitamin B12 levels. Performed at [facility], [address] [place], [place] [address]   RPR     Status: None   Collection Time: 12/24/21  6:26 AM  Result Value Ref Range   RPR Ser Ql NON REACTIVE NON REACTIVE    Comment: Performed at [facility], [address] [place], [place] [address]  Magnesium     Status: None   Collection Time: 12/24/21  6:26 AM  Result Value Ref Range   Magnesium 2.2 1.7 - 2.4 mg/dL    Comment: Performed at [facility], [address] [place], [place] [address]    Blood Alcohol level:  Lab Results  Component Value Date   ETH <10 12/22/2021    Metabolic Disorder Labs: Lab Results  Component Value Date   HGBA1C 5.1 12/22/2021   MPG 99.67 12/22/2021   No results found for: PROLACTIN Lab Results  Component Value Date   CHOL 167 12/22/2021   TRIG 74 12/22/2021   HDL 53 12/22/2021   CHOLHDL 3.2 12/22/2021   VLDL 15 12/22/2021   LDLCALC 99 12/22/2021  Physical Findings: AIMS:  , ,  ,  ,    CIWA:  CIWA-Ar Total: 0 COWS:     Musculoskeletal: Strength & Muscle Tone: within normal limits Gait & Station: normal Patient leans: N/A  Psychiatric Specialty Exam:  Presentation  General Appearance: Appropriate for  Environment; Casual  Eye Contact:-- ("stare down", attempt to intimidate)  Speech:Normal Rate  Speech Volume:Normal  Handedness:Right   Mood and Affect  Mood:Irritable  Affect:Inappropriate   Thought Process  Thought Processes:Linear  Descriptions of Associations:Intact  Orientation:Full (Time, Place and Person)  Thought Content:Logical  History of Schizophrenia/Schizoaffective disorder:No data recorded Duration of Psychotic Symptoms:-- ("my whole life")  Hallucinations:Hallucinations: -- (endorses "my personalities") Description of Auditory Hallucinations: "Anna Genre and Nadine Counts" "telling me to burn in hell, stuff like that"  Ideas of Reference:-- (inconsistent)  Suicidal Thoughts:Suicidal Thoughts: No  Homicidal Thoughts:Homicidal Thoughts: No   Sensorium  Memory:Immediate Fair; Recent Fair; Remote Fair  Judgment:Poor  Insight:Poor   Executive Functions  Concentration:Fair  Attention Span:Fair  Recall:Fair  Fund of Knowledge:Fair  Language:Fair   Psychomotor Activity  Psychomotor Activity:Psychomotor Activity: Normal   Assets  Assets:Leisure Time; Housing   Sleep  Sleep:Sleep: Fair    Physical Exam: Physical Exam Vitals and nursing note reviewed.  Constitutional:      Appearance: Normal appearance.  HENT:     Head: Normocephalic.     Nose: Nose normal.  Eyes:     Extraocular Movements: Extraocular movements intact.  Cardiovascular:     Rate and Rhythm: Normal rate.  Pulmonary:     Effort: Pulmonary effort is normal.  Musculoskeletal:        General: Normal range of motion.     Cervical back: Normal range of motion.  Feet:     Comments: Tinea pedis between 4th and 5th digit both feet Neurological:     General: No focal deficit present.     Mental Status: He is alert and oriented to person, place, and time.  Psychiatric:        Attention and Perception: Attention normal.        Mood and Affect: Affect is angry and  inappropriate.        Speech: Speech normal.        Thought Content: Thought content normal.        Cognition and Memory: Memory normal.        Judgment: Judgment is impulsive.     Comments: Selectively cooperative   Review of Systems  Constitutional:  Negative for chills and fever.  Respiratory:  Negative for cough.   Gastrointestinal:  Negative for nausea and vomiting.  Musculoskeletal:  Positive for myalgias.       Bilateral foot pain  Skin:  Positive for itching and rash.       Tinea pedis between toes  Neurological:  Negative for dizziness and headaches.  Psychiatric/Behavioral:  Positive for depression. The patient has insomnia.   Blood pressure (!) 146/88, pulse 72, temperature 98.2 F (36.8 C), temperature source Oral, resp. rate 20, height 6' (1.829 m), weight 74.8 kg, SpO2 100 %. Body mass index is 22.38 kg/m.   Treatment Plan Summary: Daily contact with patient to assess and evaluate symptoms and progress in treatment and Medication management Medications: Mood/anxiety: continue group therapy, milieu therapy, 1:1 evaluation with provider.  Zoloft 50mg  PO daily Trazodone prn sleep, vistaril prn anxiety Psychosis: unusual/inconsistent presentation? Haldol 5mg  PO QHS Substance Abuse: brief intervention provided abstinence advised.  CIWA with PRN lorazepam, thiamine replacement NRT for  smoking cessation Personality disorder: consistent application of boundaries.  Limit negative attention and inconsistent rules Avoid controlled medications Considering phone restrictions Highly suspect secondary gain/malingering component Patient at HIGH risk for violence and impulsive acting out Medical: PRNs for pain, constipation, indigestion available.  Replacing vitamin D Nystatin powder for tinea pedis  Roselle Locus, MD 12/24/2021, 6:21 PM

## 2021-12-25 MED ORDER — HYDROXYZINE HCL 50 MG PO TABS
50.0000 mg | ORAL_TABLET | Freq: Three times a day (TID) | ORAL | Status: DC | PRN
  Filled 2021-12-25: qty 1

## 2021-12-25 MED ORDER — TRAZODONE HCL 100 MG PO TABS
100.0000 mg | ORAL_TABLET | Freq: Every day | ORAL | Status: DC
  Administered 2021-12-25: 100 mg via ORAL
  Filled 2021-12-25 (×2): qty 1

## 2021-12-25 NOTE — Progress Notes (Signed)
?   12/24/21 2140  ?Psych Admission Type (Psych Patients Only)  ?Admission Status Voluntary  ?Psychosocial Assessment  ?Patient Complaints Anxiety;Depression  ?Eye Contact Fair  ?Facial Expression Anxious  ?Affect Preoccupied  ?Speech Logical/coherent  ?Interaction Assertive  ?Motor Activity Slow  ?Appearance/Hygiene Unremarkable  ?Behavior Characteristics Cooperative  ?Mood Preoccupied;Anxious  ?Thought Process  ?Coherency Circumstantial  ?Content WDL  ?Delusions None reported or observed  ?Perception Hallucinations  ?Hallucination Auditory  ?Judgment Poor  ?Confusion None  ?Danger to Self  ?Current suicidal ideation? Passive  ?Agreement Not to Harm Self Yes  ?Description of Agreement verbal contract for safety  ?Danger to Others  ?Danger to Others None reported or observed  ? ? ?

## 2021-12-25 NOTE — Progress Notes (Signed)
? ? ?   12/24/21 2140  ?Psych Admission Type (Psych Patients Only)  ?Admission Status Voluntary  ?Psychosocial Assessment  ?Patient Complaints Anxiety;Depression  ?Eye Contact Fair  ?Facial Expression Anxious  ?Affect Preoccupied  ?Speech Logical/coherent  ?Interaction Assertive  ?Motor Activity Slow  ?Appearance/Hygiene Unremarkable  ?Behavior Characteristics Cooperative  ?Mood Preoccupied;Anxious  ?Thought Process  ?Coherency Circumstantial  ?Content WDL  ?Delusions None reported or observed  ?Perception Hallucinations  ?Hallucination Auditory  ?Judgment Poor  ?Confusion None  ?Danger to Self  ?Current suicidal ideation? Passive  ?Agreement Not to Harm Self Yes  ?Description of Agreement verbal contract for safety  ?Danger to Others  ?Danger to Others None reported or observed  ? ? ?

## 2021-12-25 NOTE — Progress Notes (Signed)
[facility] MD Progress Note  12/25/2021 1:42 PM Tyler [name]  MRN:  [id number]  History of Present Illness: Tyler [name] is a 36 YO M who reports a psychiatric history of bipolar schizophrenia and chief complaint of "constant suicidal thoughts and numerous personalities". He states that he has been depressed for his "whole life". He sleeps mostly during the day and is up at night. His appetite is up and down. He hears the voices of "jack, Gabriel Rung and Nadine Counts" but finds that "Ree Kida is the one that want me to do it [die]" and tells him to kill himself or burn in hell. He denies wanting to die to this interviewer, and denies wanting to harm others. He has been under a lot of stress recently due to going through a divorce, and has been on the phone with his soon-to-be ex much of the morning. He denies delusions, paranoia. He is willing to take medications. He has been to [facility] in the past and taken seroquel, and round that this medication was very helpful.   24 hour EMR reviewed. Case discussed in progression rounds. Per nursing report, patient has been attending group sessions, and has been compliant with medications. As per nursing documentation, he was anxious last night, and also has passive SI with auditory hallucinations.  Subjective: Tyler [name] reports: "I was hearing Ree Kida this morning, but now I am hearing Nadine Counts and Gabriel Rung. I am good now."  Today's assessment (12/25/2021): Pt with flat affect and depressed mood, attention to personal hygiene and grooming is fair, eye contact is good, speech is clear & coherent. Thought contents are organized but with some illogical contents. Pt currently denies SI, but reports that he has some SI earlier this morning. He reports hearing "Ree Kida" earlier in the morning when he was upset about not being allowed to use the phone during group sessions. He states that Ree Kida "wanted me to take it to a whole different level". He clarifies that when he hears Ree Kida,, he becomes very aggressive, but he  was able to regain control with the help of the nurses. He denies HI , and reports +VH of his "baby's mama" on the unit. He reports he thought he had seen her on the unit, but realized that she was far away in [place], [place], and adds "I hate her with a passion for what she did, taking my daughter away from me." Pt then talked at length about having "four baby mamas", and has a poor relationship with all of them. Pt states that the voices he was hearing during this encounter were those of "Nadine Counts who mostly cries and Joe, who is my instigator".  Pt reports having a good appetite, and states that his sleep quality last night was poor, and states that Trazodone is not helpful. He rates his depression as 8.8 (10 being worst), and rates his anxiety as 5 (10 being worst).  Will increase Trazodone to 100 mg nightly for insomnia. Sertraline was increased to 50 mg daily for management of depressive symptoms starting today. Will increase PRN Hydroxyzine to 50 mg TID PRN.  Principal Problem: Major depressive disorder, recurrent episode, severe, with psychosis (HCC) Diagnosis: Principal Problem:   Major depressive disorder, recurrent episode, severe, with psychosis (HCC) Active Problems:   Moderate alcohol use disorder (HCC)   Moderate cannabis use disorder (HCC)   Nicotine use disorder   Anxiety disorder, unspecified   Personality disorder (HCC)   Tinea pedis of both feet   Vitamin D deficiency  Total Time spent  with patient: 20 minutes  Past Psychiatric History: previously at [facility], has been on Seroquel. Has previous attempt to hand self. Reports previous diagnosis of bipolar and schizophrenia  Past Medical History:  Past Medical History:  Diagnosis Date   Anxiety    panic and SOB    Past Surgical History:  Procedure Laterality Date   IRRIGATION AND DEBRIDEMENT ABSCESS N/A 08/04/2020   Procedure: IRRIGATION AND DEBRIDEMENT PERINEAL ABSCESS, Lovett SoxYSTOSCOPY, URETHRAGRAM;  Surgeon: Bjorn PippinWrenn, John, MD;   Location: [facility];  Service: Urology;  Laterality: N/A;   IRRIGATION AND DEBRIDEMENT ABSCESS N/A 10/20/2020   Procedure: IRRIGATION AND DEBRIDEMENT PERINEAL  ABSCESS;  Surgeon: Sebastian AcheManny, Theodore, MD;  Location: [facility];  Service: Urology;  Laterality: N/A;   IRRIGATION AND DEBRIDEMENT BUTTOCKS N/A 10/28/2020   Procedure: IRRIGATION AND DEBRIDEMENT PERINEAL ABCESS;  Surgeon: Abigail MiyamotoBlackman, Douglas, MD;  Location: [facility];  Service: General;  Laterality: N/A;  packing 2" gauze   TRANSURETHRAL RESECTION OF PROSTATE  07/21/2020   Procedure: TRANSURETHRAL RESECTION OF THE PROSTATE (TURP);  Surgeon: Rene PaciWinter, Christopher Aaron, MD;  Location: [facility];  Service: Urology;;   TRANSURETHRAL RESECTION OF PROSTATE N/A 10/20/2020   Procedure: CYSTOSCOPY, TRANSURETHRAL RESECTION OF THE PROSTATE ABCESS(TURP);  Surgeon: Sebastian AcheManny, Theodore, MD;  Location: [facility];  Service: Urology;  Laterality: N/A;   Family History: History reviewed. No pertinent family history. Family Psychiatric  History: none Social History:  Social History   Substance and Sexual Activity  Alcohol Use Yes     Social History   Substance and Sexual Activity  Drug Use Yes   Types: Marijuana    Social History   Socioeconomic History   Marital status: Single    Spouse name: Not on file   Number of children: Not on file   Years of education: Not on file   Highest education level: Not on file  Occupational History   Not on file  Tobacco Use   Smoking status: Every Day    Packs/day: 1.00    Types: Cigarettes   Smokeless tobacco: Never  Vaping Use   Vaping Use: Never used  Substance and Sexual Activity   Alcohol use: Yes   Drug use: Yes    Types: Marijuana   Sexual activity: Not Currently  Other Topics Concern   Not on file  Social History Narrative   Not on file   Social Determinants of Health   Financial Resource Strain: Not on file  Food Insecurity: Not on file  Transportation Needs: Not on file  Physical Activity: Not on file   Stress: Not on file  Social Connections: Not on file   Sleep: Good  Appetite:  Good  Current Medications: Current Facility-Administered Medications  Medication Dose Route Frequency Provider Last Rate Last Admin   acetaminophen (TYLENOL) tablet 650 mg  650 mg Oral Q6H PRN Lenard LanceAllen, Tina L, FNP   650 mg at 12/25/21 1124   alum & mag hydroxide-simeth (MAALOX/MYLANTA) 200-200-20 MG/5ML suspension 30 mL  30 mL Oral Q4H PRN Lenard LanceAllen, Tina L, FNP       haloperidol (HALDOL) tablet 5 mg  5 mg Oral Q6H PRN Hill, Shelbie HutchingStephanie Leigh, MD       And   benztropine (COGENTIN) tablet 1 mg  1 mg Oral Q6H PRN Hill, Shelbie HutchingStephanie Leigh, MD       haloperidol lactate (HALDOL) injection 5 mg  5 mg Intramuscular Q6H PRN Hill, Shelbie HutchingStephanie Leigh, MD       And   LORazepam (ATIVAN) injection 2 mg  2 mg Intramuscular Q6H PRN Hill, Shelbie Hutching, MD       And   benztropine mesylate (COGENTIN) injection 1 mg  1 mg Intramuscular Q6H PRN Hill, Shelbie Hutching, MD       haloperidol (HALDOL) tablet 5 mg  5 mg Oral QHS Hill, Shelbie Hutching, MD   5 mg at 12/24/21 2139   hydrOXYzine (ATARAX) tablet 50 mg  50 mg Oral TID PRN Starleen Blue, NP       LORazepam (ATIVAN) tablet 1 mg  1 mg Oral Q8H PRN Hill, Shelbie Hutching, MD       magnesium hydroxide (MILK OF MAGNESIA) suspension 30 mL  30 mL Oral Daily PRN Lenard Lance, FNP       nicotine polacrilex (NICORETTE) gum 2 mg  2 mg Oral PRN Hill, Shelbie Hutching, MD       nystatin (MYCOSTATIN/NYSTOP) topical powder   Topical BID Roselle Locus, MD   Given at 12/25/21 0820   sertraline (ZOLOFT) tablet 50 mg  50 mg Oral Daily Hill, Shelbie Hutching, MD   50 mg at 12/25/21 7035   thiamine tablet 100 mg  100 mg Oral Daily Roselle Locus, MD   100 mg at 12/25/21 0819   traZODone (DESYREL) tablet 100 mg  100 mg Oral QHS Starleen Blue, NP       Vitamin D (Ergocalciferol) (DRISDOL) capsule 50,000 Units  50,000 Units Oral Q7 days Roselle Locus, MD   50,000 Units at 12/25/21 1120     Lab Results:  Results for orders placed or performed during the hospital encounter of 12/22/21 (from the past 48 hour(s))  VITAMIN D 25 Hydroxy (Vit-D Deficiency, Fractures)     Status: Abnormal   Collection Time: 12/24/21  6:26 AM  Result Value Ref Range   Vit D, 25-Hydroxy 9.10 (L) 30 - 100 ng/mL    Comment: (NOTE) Vitamin D deficiency has been defined by the Institute of Medicine  and an Endocrine Society practice guideline as a level of serum 25-OH  vitamin D less than 20 ng/mL (1,2). The Endocrine Society went on to  further define vitamin D insufficiency as a level between 21 and 29  ng/mL (2).  1. IOM (Institute of Medicine). 2010. Dietary reference intakes for  calcium and D. [place]: [employer]. 2. Holick MF, Binkley Roseland, Bischoff-Ferrari HA, et al. Evaluation,  treatment, and prevention of vitamin D deficiency: an Endocrine  Society clinical practice guideline, JCEM. 2011 Jul; 96(7): 1911-30.  Performed at [facility], [address], [place], [place] [address]   Vitamin B12     Status: None   Collection Time: 12/24/21  6:26 AM  Result Value Ref Range   Vitamin B-12 326 180 - 914 pg/mL    Comment: (NOTE) This assay is not validated for testing neonatal or myeloproliferative syndrome specimens for Vitamin B12 levels. Performed at [facility], [address], [place], [place] [address]   RPR     Status: None   Collection Time: 12/24/21  6:26 AM  Result Value Ref Range   RPR Ser Ql NON REACTIVE NON REACTIVE    Comment: Performed at [facility], [address] [place], [place] [address]  Magnesium     Status: None   Collection Time: 12/24/21  6:26 AM  Result Value Ref Range   Magnesium 2.2 1.7 - 2.4 mg/dL    Comment: Performed at [facility], [address]  [place], [place] [address]  Glucose, capillary     Status: Abnormal   Collection Time: 12/24/21  9:22 PM  Result Value Ref Range    Glucose-Capillary 112 (H) 70 - 99 mg/dL    Comment: Glucose reference range applies only to samples taken after fasting for at least 8 hours.    Blood Alcohol level:  Lab Results  Component Value Date   ETH <10 12/22/2021    Metabolic Disorder Labs: Lab Results  Component Value Date   HGBA1C 5.1 12/22/2021   MPG 99.67 12/22/2021   No results found for: PROLACTIN Lab Results  Component Value Date   CHOL 167 12/22/2021   TRIG 74 12/22/2021   HDL 53 12/22/2021   CHOLHDL 3.2 12/22/2021   VLDL 15 12/22/2021   LDLCALC 99 12/22/2021   Physical Findings: AIMS: 0 CIWA:  CIWA-Ar Total: 0 COWS:     Musculoskeletal: Strength & Muscle Tone: within normal limits Gait & Station: normal Patient leans: N/A  Psychiatric Specialty Exam:  Presentation  General Appearance: Appropriate for Environment; Fairly Groomed  Eye Contact:Fair  Speech:Clear and Coherent  Speech Volume:Normal  Handedness:Right  Mood and Affect  Mood:Depressed; Anxious  Affect:Congruent  Thought Process  Thought Processes:Coherent  Descriptions of Associations:Intact  Orientation:Full (Time, Place and Person)  Thought Content:Illogical  History of Schizophrenia/Schizoaffective disorder:No data recorded Duration of Psychotic Symptoms:-- ("my whole life")  Hallucinations:Hallucinations: Auditory; Visual Description of Auditory Hallucinations: voices (multiple-[name], Bod, Joe) Description of Visual Hallucinations: seeing "baby's mama"  Ideas of Reference:None  Suicidal Thoughts:Suicidal Thoughts: No  Homicidal Thoughts:Homicidal Thoughts: No  Sensorium  Memory:Immediate Good  Judgment:Poor  Insight:Poor  Executive Functions  Concentration:Fair  Attention Span:Fair  Recall:Fair  Fund of Knowledge:Fair  Language:Fair  Psychomotor Activity  Psychomotor Activity:Psychomotor Activity: Normal  Assets  Assets:Communication Skills  Sleep  Sleep:Sleep: Poor  Physical  Exam: Physical Exam Vitals and nursing note reviewed.  Constitutional:      Appearance: Normal appearance.  HENT:     Head: Normocephalic.     Nose: Nose normal.  Eyes:     Extraocular Movements: Extraocular movements intact.  Cardiovascular:     Rate and Rhythm: Normal rate.  Pulmonary:     Effort: Pulmonary effort is normal.  Musculoskeletal:        General: Normal range of motion.     Cervical back: Normal range of motion.  Feet:     Comments: Tinea pedis between 4th and 5th digit both feet Neurological:     General: No focal deficit present.     Mental Status: He is alert and oriented to person, place, and time.  Psychiatric:        Attention and Perception: Attention normal.        Mood and Affect: Affect is angry and inappropriate.        Speech: Speech normal.        Thought Content: Thought content normal.        Cognition and Memory: Memory normal.        Judgment: Judgment is impulsive.     Comments: Selectively cooperative   Review of Systems  Constitutional:  Negative for chills and fever.  Respiratory:  Negative for cough.   Gastrointestinal:  Negative for nausea and vomiting.  Musculoskeletal:  Positive for myalgias.       Bilateral foot pain  Skin:  Positive for itching and rash.       Tinea pedis between toes  Neurological:  Negative for dizziness and headaches.  Psychiatric/Behavioral:  Positive for depression. The patient has insomnia.   Blood pressure 122/89, pulse 86, temperature 97.9 F (36.6 C), temperature source Oral, resp. rate 20, height 6' (1.829 m), weight 74.8 kg, SpO2 100 %. Body mass index is 22.38 kg/m.  Treatment Plan Summary: Daily contact with patient to assess and evaluate symptoms and progress in treatment and Medication management Medications: Mood/anxiety: continue group therapy, milieu therapy, 1:1 evaluation with provider.  Continue Zoloft  PO daily Trazodone increased to  nightly for insomnia, vistaril increased to  50 mg TID prn for anxiety Psychosis: unusual/inconsistent presentation? Continue Haldol  PO QHS Substance Abuse: brief intervention provided abstinence advised.  CIWA with PRN lorazepam, thiamine replacement NRT for smoking cessation Personality disorder: consistent application of boundaries.  Limit negative attention and inconsistent rules Avoid controlled medications Considering phone restrictions Highly suspect secondary gain/malingering component Patient at HIGH risk for violence and impulsive acting out Medical: PRNs for pain, constipation, indigestion available.  Replacing vitamin D Nystatin powder for tinea pedis  Starleen Blue, NP 12/25/2021, 1:42 PM Patient ID: Jason Coop, male   DOB: May 12, 1986, 36 y.o.   MRN: [id number]

## 2021-12-25 NOTE — Group Note (Signed)
LCSW Group Therapy Note ? ?Group Date: 12/25/2021 ?Start Time: 1000 ?End Time: 1100 ? ? ?Type of Therapy and Topic:  Group Therapy: Anger Cues and Responses ? ?Participation Level:  Active ? ? ?Description of Group:   ?In this group, patients learned how to recognize the physical, cognitive, emotional, and behavioral responses they have to anger-provoking situations.  They identified a recent time they became angry and how they reacted.  They analyzed how their reaction was possibly beneficial and how it was possibly unhelpful.  The group discussed a variety of healthier coping skills that could help with such a situation in the future.  Focus was placed on how helpful it is to recognize the underlying emotions to our anger, because working on those can lead to a more permanent solution as well as our ability to focus on the important rather than the urgent. ? ?Therapeutic Goals: ?Patients will remember their last incident of anger and how they felt emotionally and physically, what their thoughts were at the time, and how they behaved. ?Patients will identify how their behavior at that time worked for them, as well as how it worked against them. ?Patients will explore possible new behaviors to use in future anger situations. ?Patients will learn that anger itself is normal and cannot be eliminated, and that healthier reactions can assist with resolving conflict rather than worsening situations. ? ?Summary of Patient Progress:  Tyler [name] was active during the group but he also left the group room numerous times. He shared a recent occurrence wherein feeling irritated about being unable to use the phone between groups led to anger. He said he becomes self-destructive when angry rather than lashing out at others, because he does not want to go back to prison by aiming his anger at others.  His anger makes him want to hang himself or walk into traffic.  He said he is living "in a dark hole" and wants to get some therapy. He  demonstrated some insight into the subject matter, was in and out of the room numerous times and appeared irritable at all times. ? ?Therapeutic Modalities:   ?Cognitive Behavioral Therapy ? ? ? ?Lynnell Chad, LCSWA ?12/25/2021  4:34 PM   ? ?

## 2021-12-25 NOTE — Progress Notes (Signed)
The patient rated his day as a 7 out of 10 since it was just an "average day" for him. His goal for tomorrow is to work on his anger and outbursts.  ?

## 2021-12-25 NOTE — Plan of Care (Signed)
  Problem: Activity: Goal: Sleeping patterns will improve Outcome: Progressing   Problem: Education: Goal: Emotional status will improve Outcome: Not Progressing Goal: Mental status will improve Outcome: Not Progressing   

## 2021-12-25 NOTE — [facility] Group Notes (Addendum)
Psychoeducational Group Note ? ? ? ?Date:12/25/21 ?Time: 1300-1400 ? ? ? ?Purpose of Group: . The group focus' on teaching patients on how to identify their needs and their Life Skills:  A group where two lists are made. What people need and what are things that we do that are unhealthy. The lists are developed by the patients and it is explained that we often do the actions that are not healthy to get our list of needs met. ? ?Goal:: to develop the coping skills needed to get their needs met ? ?Participation Level:  did not attend ? ?Tyler [name] A ? ?

## 2021-12-25 NOTE — [facility] Group Notes (Signed)
Group Note: ?D: Group was over and pt was attempting to make a phone call. Phones are turned off during group and another group was about to begin within 5 minutes. Pt became agitated due to the phones being off and not able to contact his people. Pt became agitated, frustrated and angry that he could not talk on the phone. ?A: there was a 5 minute lag in between the groups so he was allowed to speak for 5 minutes. ?R; Pt. Calmed down and was able to apologize. ? ?

## 2021-12-25 NOTE — [facility] Group Notes (Signed)
.  Psychoeducational Group Note ? ?Date: 12/25/2021 ?Time: 0900-1000 ? ? ? ?Goal Setting  ? ?Purpose of Group: This group helps to provide patients with the steps of setting a goal that is specific, measurable, attainable, realistic and time specific. A discussion on how we keep ourselves stuck with negative self talk. Homework given for Patients to write 30 positive attributes about themselves. ? ? ? ?Participation Level:  Active ? ?Participation Quality:  Appropriate ? ?Affect:  Appropriate ? ?Cognitive:  Appropriate ? ?Insight:  Improving ? ?Engagement in Group:  Engaged ? ?Additional Comments:  Pt attended the group. Poor eye contact. Rates his energy at a 7/10. Was quiet in the group except to answer questions. ? ?Tyler [name] A ?

## 2021-12-26 DIAGNOSIS — F333 Major depressive disorder, recurrent, severe with psychotic symptoms: Principal | ICD-10-CM

## 2021-12-26 MED ORDER — HYDROXYZINE HCL 50 MG PO TABS: 50.0000 mg | ORAL_TABLET | Freq: Four times a day (QID) | ORAL | 0 refills | Status: DC | PRN

## 2021-12-26 MED ORDER — SERTRALINE HCL 50 MG PO TABS: 50.0000 mg | ORAL_TABLET | Freq: Every day | ORAL | 0 refills | Status: DC

## 2021-12-26 MED ORDER — NICOTINE POLACRILEX 2 MG MT GUM: 2.0000 mg | CHEWING_GUM | OROMUCOSAL | 0 refills | Status: DC | PRN

## 2021-12-26 MED ORDER — TRAZODONE HCL 100 MG PO TABS: 100.0000 mg | ORAL_TABLET | Freq: Every day | ORAL | 0 refills | Status: DC

## 2021-12-26 MED ORDER — VITAMIN D (ERGOCALCIFEROL) 1.25 MG (50000 UNIT) PO CAPS: 50000.0000 [IU] | ORAL_CAPSULE | ORAL | 0 refills | Status: DC

## 2021-12-26 MED ORDER — HALOPERIDOL 5 MG PO TABS: 5.0000 mg | ORAL_TABLET | Freq: Every day | ORAL | 0 refills | Status: DC

## 2021-12-26 NOTE — [facility] Suicide Risk Assessment (Addendum)
Suicide Risk Assessment  Discharge Assessment    Tyler [name] Discharge Suicide Risk Assessment   Principal Problem: Major depressive disorder, recurrent episode, severe, with psychosis (HCC) Discharge Diagnoses: Principal Problem:   Major depressive disorder, recurrent episode, severe, with psychosis (HCC) Active Problems:   Moderate alcohol use disorder (HCC)   Moderate cannabis use disorder (HCC)   Nicotine use disorder   Anxiety disorder, unspecified   Personality disorder (HCC)   Tinea pedis of both feet   Vitamin D deficiency  History of Present Illness: Tyler [name] is a 36 YO M who reports a psychiatric history of bipolar schizophrenia and chief complaint of "constant suicidal thoughts and numerous personalities". He states that he has been depressed for his "whole life". He sleeps mostly during the day and is up at night. His appetite is up and down. He hears the voices of "Tyler [name], Tyler [name] and Tyler [name]" but finds that "Tyler [name] is the one that want me to do it [die]" and tells him to kill himself or burn in hell.  [name] COURSE: During the patient's hospitalization, patient had extensive initial psychiatric evaluation, and follow-up psychiatric evaluations every day. Psychiatric diagnoses for which pt is being treated during this admission and medications are as follows:   Mood/anxiety: Zoloft 50mg  PO daily for MDD Trazodone 100mg  nightly for insomnia, 50 mg TID prn for anxiety Psychosis: unusual/inconsistent presentation? Continue Haldol 5mg  PO QHS Substance Abuse: brief intervention provided abstinence advised.  CIWA with PRN lorazepam completed (not discharging on Ativan) thiamine replacement completed. NRT for smoking cessation Personality disorder: consistent application of boundaries.  Avoid controlled medications  Medical: PRNs for pain, constipation, indigestion available.  Replacing vitamin D Treated with Nystatin powder for tinea pedis (treatment completed) Pt is discharging home on  the above medications with the exception of Ativan. Patient's care was discussed during the interdisciplinary team meeting every day during the hospitalization. The patient denies having side effects to prescribed psychiatric medication.  The patient was evaluated each day by a clinical provider to ascertain response to treatment. Improvement was noted by the patient's report of decreasing symptoms, improved sleep and appetite, affect, medication tolerance, behavior, and participation in unit programming.  Patient was asked each day to complete a self inventory noting mood, mental status, pain, new symptoms, anxiety and concerns.   Symptoms were reported as significantly decreased or resolved completely by discharge.  The patient reports that their mood is stable.  The patient denied having suicidal thoughts for more than 48 hours prior to discharge.  Patient denies having homicidal thoughts.  Patient denies having auditory hallucinations.  Patient denies any visual hallucinations or other symptoms of psychosis.  The patient was motivated to continue taking medication with a goal of continued improvement in mental health. The patient reports their target psychiatric symptoms of denies responded well to the psychiatric medications, and the patient reports overall benefit other psychiatric hospitalization. Supportive psychotherapy was provided to the patient. The patient also participated in regular group therapy while hospitalized. Coping skills, problem solving as well as relaxation therapies were also part of the unit programming.  Labs were reviewed with the patient, and abnormal results were discussed with the patient. CR is slightly elevated at 1.33, pt is agreeable to following this up with his PCP on discharge.  Total Time spent with patient: 30 minutes  Musculoskeletal: Strength & Muscle Tone: within normal limits Gait & Station: normal Patient leans: N/A  Psychiatric Specialty  Exam  Presentation  General Appearance: Appropriate for Environment  Eye Contact:Good  Speech:Clear and Coherent  Speech Volume:Normal  Handedness:Right  Mood and Affect  Mood:Euthymic  Duration of Depression Symptoms: Greater than two weeks  Affect:Appropriate  Thought Process  Thought Processes:Coherent  Descriptions of Associations:Intact  Orientation:Full (Time, Place and Person)  Thought Content:Logical  History of Schizophrenia/Schizoaffective disorder:No data recorded Duration of Psychotic Symptoms:Greater than six months  Hallucinations:Hallucinations: None Description of Auditory Hallucinations: voices (multiple-Tyler [name], Tyler [name], Tyler [name]) Description of Visual Hallucinations: seeing "baby's mama"  Ideas of Reference:None  Suicidal Thoughts:Suicidal Thoughts: No  Homicidal Thoughts:Homicidal Thoughts: No  Sensorium  Memory:Immediate Good  Judgment:Good  Insight:Good  Executive Functions  Concentration:Good  Attention Span:Good  Recall:Good  Fund of Knowledge:Good  Language:Good  Psychomotor Activity  Psychomotor Activity:Psychomotor Activity: Normal  Assets  Assets:Communication Skills  Sleep  Sleep:Sleep: Good  Physical Exam: Physical Exam Constitutional:      Appearance: Normal appearance.  HENT:     Head: Normocephalic.     Nose: No congestion or rhinorrhea.  Eyes:     Pupils: Pupils are equal, round, and reactive to light.  Pulmonary:     Effort: Pulmonary effort is normal.  Musculoskeletal:        General: Normal range of motion.     Cervical back: Normal range of motion. No rigidity.  Neurological:     Mental Status: He is alert and oriented to person, place, and time.  Psychiatric:        Behavior: Behavior normal.        Thought Content: Thought content normal.   Review of Systems  Constitutional: Negative.  Negative for fever.  HENT: Negative.  Negative for sore throat.   Eyes: Negative.   Respiratory: Negative.   Negative for cough.   Cardiovascular: Negative.  Negative for chest pain.  Gastrointestinal: Negative.  Negative for heartburn.  Genitourinary: Negative.   Musculoskeletal: Negative.   Skin: Negative.   Neurological: Negative.   Endo/Heme/Allergies: Negative.   Psychiatric/Behavioral:  Positive for depression (pt verbalizes an improvement in his depressive symptoms).   Blood pressure (!) 127/91, pulse 87, temperature 98.4 F (36.9 C), temperature source Oral, resp. rate 20, height 6' (1.829 m), weight 74.8 kg, SpO2 99 %. Body mass index is 22.38 kg/m.  Mental Status Per Nursing Assessment::   On Admission:  Self-harm thoughts, Suicidal ideation indicated by patient  Demographic Factors:  Male  Loss Factors: Financial problems/change in socioeconomic status  Historical Factors: Impulsivity  Risk Reduction Factors:   NA  Continued Clinical Symptoms:  Patient reports a resolution in his depressive symptoms  Cognitive Features That Contribute To Risk:  None    Suicide Risk:  Minimal: No identifiable suicidal ideation.  Patients presenting with no risk factors but with morbid ruminations; may be classified as minimal risk based on the severity of the depressive symptoms   Follow-up Information     [name].   Specialty: Behavioral Health Why: Please go to this provider for therapy and medication management services during walk in hours:  Monday through Wednesday, from 7:30 am to 10:30 am.  Services are provided on a first come, first served basis. Contact information: [address] [place] [phone]               Plan Of Care/Follow-up recommendations:  The patient is able to verbalize their individual safety plan to this provider.  # It is recommended to the patient to continue psychiatric medications as prescribed, after discharge from the [name].    # It is  recommended to the patient to follow  up with your outpatient psychiatric provider and PCP.  # It was discussed with the patient, the impact of alcohol, drugs, tobacco have been there overall psychiatric and medical wellbeing, and total abstinence from substance use was recommended the patient.ed.  # Prescriptions provided or sent directly to preferred pharmacy at discharge. Patient agreeable to plan. Given opportunity to ask questions. Appears to feel comfortable with discharge.    # In the event of worsening symptoms, the patient is instructed to call the crisis hotline, 911 and or go to the nearest ED for appropriate evaluation and treatment of symptoms. To follow-up with primary care provider for other medical issues, concerns and or health care needs  # Patient was discharged home with a plan to follow up as noted above    Starleen Blue, NP 12/26/2021, 10:08 AM

## 2021-12-26 NOTE — Progress Notes (Signed)
?  [facility] Adult Case Management Discharge Plan : ? ?Will you be returning to the same living situation after discharge:  Yes,  with parents ?At discharge, do you have transportation home?: Yes,  arranged by patient ?Do you have the ability to pay for your medications: Yes,  insurance ? ?Release of information consent forms completed and emailed to Medical Records, then turned in to Medical Records by CSW.  ? ?Patient to Follow up at: ? Follow-up Information   ? ? [facility]. Go to.   ?Specialty: Behavioral Health ?Why: Please go to this provider for therapy and medication management services during walk in hours:  Monday through Wednesday, from 7:30 am to 10:30 am.  Services are provided on a first come, first served basis. ?Contact information: ?[address] ?[place] [address] ?[phone] ? ?  ?  ? ?  ?  ? ?  ? ? ?Next level of care provider has access to [facility] Link:yes ? ?Safety Planning and Suicide Prevention discussed: Yes,  with cousin ? ?  ? ?Has patient been referred to the Quitline?: Patient refused referral ? ?Patient has been referred for addiction treatment: Yes ? ?Lynnell Chad, LCSW ?12/26/2021, 10:08 AM ?

## 2021-12-26 NOTE — Progress Notes (Signed)
Pt discharged to lobby. Pt was stable and appreciative at that time. All papers and prescriptions were given and valuables returned. Verbal understanding expressed. Denies SI/HI and A/VH. Pt given opportunity to express concerns and ask questions.  

## 2021-12-26 NOTE — Discharge Summary (Signed)
Physician Discharge Summary Note  Patient:  Lawrnce [name] is an 36 y.o., male MRN:  [id number] DOB:  August 05, 1986 Patient phone:  [phone] (home)  Patient address:   [place] [id number],  Total Time spent with patient: 30 minutes  Date of Admission:  12/22/2021 Date of Discharge: 12/26/2021  Reason for Admission:  Tyler [name] is a 36 YO M who reports a psychiatric history of bipolar schizophrenia and chief complaint of "constant suicidal thoughts and numerous personalities". He states that he has been depressed for his "whole life". He sleeps mostly during the day and is up at night. His appetite is up and down. He hears the voices of "jack, [name] and [name]" but finds that "[name] is the one that want me to do it [die]" and tells him to kill himself or burn in hell.  Principal Problem: Major depressive disorder, recurrent episode, severe, with psychosis ([place]) Discharge Diagnoses: Principal Problem:   Major depressive disorder, recurrent episode, severe, with psychosis ([place]) Active Problems:   Moderate alcohol use disorder (HCC)   Moderate cannabis use disorder (HCC)   Nicotine use disorder   Anxiety disorder, unspecified   Personality disorder ([place])   Tinea pedis of both feet   Vitamin D deficiency  Past Psychiatric History: As above  Past Medical History:  Past Medical History:  Diagnosis Date   Anxiety    panic and SOB    Past Surgical History:  Procedure Laterality Date   IRRIGATION AND DEBRIDEMENT ABSCESS N/A 08/04/2020   Procedure: IRRIGATION AND DEBRIDEMENT PERINEAL ABSCESS, [place];  Surgeon: Irine Seal, MD;  Location: [facility];  Service: Urology;  Laterality: N/A;   IRRIGATION AND DEBRIDEMENT ABSCESS N/A 10/20/2020   Procedure: IRRIGATION AND DEBRIDEMENT PERINEAL  ABSCESS;  Surgeon: Alexis Frock, MD;  Location: [facility];  Service: Urology;  Laterality: N/A;   IRRIGATION AND DEBRIDEMENT BUTTOCKS N/A 10/28/2020   Procedure: IRRIGATION  AND DEBRIDEMENT PERINEAL ABCESS;  Surgeon: Coralie Keens, MD;  Location: [facility];  Service: General;  Laterality: N/A;  packing 2" gauze   TRANSURETHRAL RESECTION OF PROSTATE  07/21/2020   Procedure: TRANSURETHRAL RESECTION OF THE PROSTATE (TURP);  Surgeon: Ceasar Mons, MD;  Location: [facility];  Service: Urology;;   TRANSURETHRAL RESECTION OF PROSTATE N/A 10/20/2020   Procedure: CYSTOSCOPY, TRANSURETHRAL RESECTION OF THE PROSTATE ABCESS(TURP);  Surgeon: Alexis Frock, MD;  Location: [facility];  Service: Urology;  Laterality: N/A;   Family History: History reviewed. No pertinent family history. Family Psychiatric  History: none reported Social History:  Social History   Substance and Sexual Activity  Alcohol Use Yes     Social History   Substance and Sexual Activity  Drug Use Yes   Types: Marijuana    Social History   Socioeconomic History   Marital status: Single    Spouse name: Not on file   Number of children: Not on file   Years of education: Not on file   Highest education level: Not on file  Occupational History   Not on file  Tobacco Use   Smoking status: Every Day    Packs/day: 1.00    Types: Cigarettes   Smokeless tobacco: Never  Vaping Use   Vaping Use: Never used  Substance and Sexual Activity   Alcohol use: Yes   Drug use: Yes    Types: Marijuana   Sexual activity: Not Currently  Other Topics Concern   Not on file  Social History Narrative   Not on file  Social Determinants of Health   Financial Resource Strain: Not on file  Food Insecurity: Not on file  Transportation Needs: Not on file  Physical Activity: Not on file  Stress: Not on file  Social Connections: Not on file   Hospital Course:   The patient is able to verbalize their individual safety plan to this provider.   # It is recommended to the patient to continue psychiatric medications as prescribed, after discharge from the hospital.     # It is recommended to the patient to  follow up with your outpatient psychiatric provider and PCP.   # It was discussed with the patient, the impact of alcohol, drugs, tobacco have been there overall psychiatric and medical wellbeing, and total abstinence from substance use was recommended the patient.ed.   # Prescriptions provided or sent directly to preferred pharmacy at discharge. Patient agreeable to plan. Given opportunity to ask questions. Appears to feel comfortable with discharge.    # In the event of worsening symptoms, the patient is instructed to call the crisis hotline, 911 and or go to the nearest ED for appropriate evaluation and treatment of symptoms. To follow-up with primary care provider for other medical issues, concerns and or health care needs   # Patient was discharged home with a plan to follow up as noted above    Physical Findings: AIMS: Facial and Oral Movements Muscles of Facial Expression: None, normal Lips and Perioral Area: None, normal Jaw: None, normal Tongue: None, normal,Extremity Movements Upper (arms, wrists, hands, fingers): None, normal Lower (legs, knees, ankles, toes): None, normal, Trunk Movements Neck, shoulders, hips: None, normal, Overall Severity Severity of abnormal movements (highest score from questions above): None, normal Incapacitation due to abnormal movements: None, normal Patient's awareness of abnormal movements (rate only patient's report): No Awareness, Dental Status Current problems with teeth and/or dentures?: No Does patient usually wear dentures?: No  CIWA:  CIWA-Ar Total: 2 COWS:  n/a  AIMS: 0  Musculoskeletal: Strength & Muscle Tone: within normal limits Gait & Station: normal Patient leans: N/A  Psychiatric Specialty Exam:  Presentation  General Appearance: Appropriate for Environment  Eye Contact:Good  Speech:Clear and Coherent  Speech Volume:Normal  Handedness:Right  Mood and Affect  Mood:Euthymic  Affect:Appropriate  Thought Process   Thought Processes:Coherent  Descriptions of Associations:Intact  Orientation:Full (Time, Place and Person)  Thought Content:Logical  History of Schizophrenia/Schizoaffective disorder:No data recorded Duration of Psychotic Symptoms:Greater than six months  Hallucinations:Hallucinations: None Description of Auditory Hallucinations: voices (multiple-[name], Bod, Joe) Description of Visual Hallucinations: seeing "baby's mama"  Ideas of Reference:None  Suicidal Thoughts:Suicidal Thoughts: No  Homicidal Thoughts:Homicidal Thoughts: No  Sensorium  Memory:Immediate Good  Judgment:Good  Insight:Good  Executive Functions  Concentration:Good  Attention Span:Good  [place] of Knowledge:Good  Language:Good  Psychomotor Activity  Psychomotor Activity:Psychomotor Activity: Normal  Assets  Assets:Communication Skills  Sleep  Sleep:Sleep: Good  Physical Exam: Physical Exam Constitutional:      General: He is not in acute distress.    Appearance: Normal appearance. He is not ill-appearing.  HENT:     Head: Normocephalic.     Nose: Nose normal. No congestion or rhinorrhea.  Eyes:     Pupils: Pupils are equal, round, and reactive to light.  Pulmonary:     Effort: Pulmonary effort is normal. No respiratory distress.  Musculoskeletal:        General: Normal range of motion.     Cervical back: Normal range of motion. No rigidity.  Neurological:  Mental Status: He is alert and oriented to person, place, and time.     Sensory: No sensory deficit.     Coordination: Coordination normal.  Psychiatric:        Behavior: Behavior normal.        Thought Content: Thought content normal.   Review of Systems  Constitutional: Negative.  Negative for fever.  HENT: Negative.  Negative for hearing loss and sore throat.   Eyes: Negative.   Respiratory: Negative.  Negative for cough, hemoptysis, shortness of breath and wheezing.   Cardiovascular: Negative.  Negative for  chest pain and palpitations.  Gastrointestinal: Negative.  Negative for heartburn, nausea and vomiting.  Genitourinary: Negative.   Musculoskeletal: Negative.   Skin: Negative.   Neurological: Negative.   Endo/Heme/Allergies: Negative.   Psychiatric/Behavioral:  Positive for depression (depressive symptoms improving. Pt denies SI/HI/AVH, denies paranoia).   Blood pressure (!) 127/91, pulse 87, temperature 98.4 F (36.9 C), temperature source Oral, resp. rate 20, height 6' (1.829 m), weight 74.8 kg, SpO2 99 %. Body mass index is 22.38 kg/m.  Social History   Tobacco Use  Smoking Status Every Day   Packs/day: 1.00   Types: Cigarettes  Smokeless Tobacco Never   Tobacco Cessation:  A prescription for an FDA-approved tobacco cessation medication provided at discharge  Blood Alcohol level:  Lab Results  Component Value Date   ETH <10 [id number]   Metabolic Disorder Labs:  Lab Results  Component Value Date   HGBA1C 5.1 12/22/2021   MPG 99.67 12/22/2021   No results found for: PROLACTIN Lab Results  Component Value Date   CHOL 167 12/22/2021   TRIG 74 12/22/2021   HDL 53 12/22/2021   CHOLHDL 3.2 12/22/2021   VLDL 15 12/22/2021   [place] 99 12/22/2021   See Psychiatric Specialty Exam and Suicide Risk Assessment completed by Attending Physician prior to discharge.  Discharge destination:  Home  Is patient on multiple antipsychotic therapies at discharge:  No   Has Patient had three or more failed trials of antipsychotic monotherapy by history:  No  Recommended Plan for Multiple Antipsychotic Therapies: NA  Allergies as of 12/26/2021   No Known Allergies      Medication List     TAKE these medications      Indication  haloperidol 5 MG tablet Commonly known as: HALDOL Take 1 tablet (5 mg total) by mouth at bedtime.  Indication: Psychosis   hydrOXYzine 50 MG tablet Commonly known as: ATARAX Take 1 tablet (50 mg total) by mouth every 6 (six) hours as needed for  anxiety.  Indication: Feeling Anxious   nicotine polacrilex 2 MG gum Commonly known as: NICORETTE Take 1 each (2 mg total) by mouth as needed for smoking cessation.  Indication: Nicotine Addiction   sertraline 50 MG tablet Commonly known as: ZOLOFT Take 1 tablet (50 mg total) by mouth daily. Start taking on: December 27, 2021  Indication: Major Depressive Disorder   traZODone 100 MG tablet Commonly known as: DESYREL Take 1 tablet (100 mg total) by mouth at bedtime.  Indication: Trouble Sleeping   Vitamin D (Ergocalciferol) 1.25 MG (50000 UNIT) Caps capsule Commonly known as: DRISDOL Take 1 capsule (50,000 Units total) by mouth every 7 (seven) days. Start taking on: January 01, 2022  Indication: Vitamin D Deficiency        Follow-up [facility]. Go to.   Specialty: Behavioral Health Why: Please go to this provider for therapy and medication  management services during walk in hours:  Monday through Wednesday, from 7:30 am to 10:30 am.  Services are provided on a first come, first served basis. Contact information: [place] [phone]               Follow-up recommendations:   The patient is able to verbalize their individual safety plan to this provider.   # It is recommended to the patient to continue psychiatric medications as prescribed, after discharge from the hospital.     # It is recommended to the patient to follow up with your outpatient psychiatric provider and PCP.   # It was discussed with the patient, the impact of alcohol, drugs, tobacco have been there overall psychiatric and medical wellbeing, and total abstinence from substance use was recommended the patient.ed.   # Prescriptions provided or sent directly to preferred pharmacy at discharge. Patient agreeable to plan. Given opportunity to ask questions. Appears to feel comfortable with discharge.    # In the event of  worsening symptoms, the patient is instructed to call the crisis hotline, 911 and or go to the nearest ED for appropriate evaluation and treatment of symptoms. To follow-up with primary care provider for other medical issues, concerns and or health care needs   # Patient was discharged home with a plan to follow up as noted above    Signed: Nicholes Rough, NP 12/26/2021, 12:18 PM

## 2021-12-26 NOTE — Group Note (Signed)
Date:  12/26/2021 ?Time:  9:04 AM ? ?Group Topic/Focus:  ?Orientation:   The focus of this group is to educate the patient on the purpose and policies of crisis stabilization and provide a format to answer questions about their admission.  The group details unit policies and expectations of patients while admitted. ? ? ? ?Participation Level:  Active ? ?Participation Quality:  Appropriate ? ?Affect:  Appropriate ? ?Cognitive:  Appropriate ? ?Insight: Appropriate ? ?Engagement in Group:  Lacking ? ?Modes of Intervention:  Discussion ? ?Additional Comments: Patient was resistant to fully participating as evidenced by not wanting to fill out his Self- inventory sheet without several prompts to do so.  ? ?Reymundo Poll ?12/26/2021, 9:04 AM ? ?

## 2021-12-26 NOTE — Plan of Care (Signed)
°  Problem: Education: °Goal: Emotional status will improve °Outcome: Progressing °Goal: Mental status will improve °Outcome: Progressing °Goal: Verbalization of understanding the information provided will improve °Outcome: Progressing °  °

## 2021-12-26 NOTE — [facility] Group Notes (Signed)
LCSW Group Therapy Note ? ?No social work group was held today due to a scheduling conflict.  Another licensed group was held by an Charity fundraiser. ? ?Ambrose Mantle, LCSW ?08/07/2021 ?2:03 PM  ?   ? ?

## 2021-12-26 NOTE — [facility] Group Notes (Signed)
Adult Psychoeducational Group Not ?Date:  12/26/2021 ?Time:  0981-1914 ?Group Topic/Focus: PROGRESSIVE RELAXATION. A group where deep breathing is taught and tensing and relaxation muscle groups is used. Imagery is used as well.  Pts are asked to imagine 3 pillars that hold them up when they are not able to hold themselves up and to share that with the group. ? ?Participation Level:  Active ? ?Participation Quality:  Appropriate ? ?Affect:  Appropriate ? ?Cognitive:  Oriented ? ?Insight: Improving ? ?Engagement in Group:  Engaged ? ?Modes of Intervention:  Activity, Discussion, Education, and Support ? ?Additional Comments:  Rates his energy as a 7/10. States his children, his mom and dad and God. ? ?Vira Blanco A ? ?

## 2021-12-26 NOTE — Progress Notes (Addendum)
D. Pt presented with an anxious affect/mood- rated his depression, hopelessness and anxiety a 5/4/5, respectively. Pt reported that his goal was to work on his anger, and try to get better to go home to his  kids. Pt denied SI/HI and stated that his AH are much quieter. Pt inquiring about discharge this am at [facility]. Pt observed attending groups ?A. Labs and vitals monitored. Pt given and educated on medications. Pt supported emotionally and encouraged to express concerns and ask questions.   ?R. Pt remains safe with 15 minute checks. Will continue POC. ? ?  ?

## 2022-01-03 NOTE — Discharge Summary (Signed)
Physician Discharge Summary Note ? ?Patient:  Tyler [name] is an 36 y.o., male ?MRN:  [id number] ?DOB:  December 02, 1985 ?Patient phone:  [phone] (home)  ?Patient address:   ?[address] ?[place] [address],  ?Total Time spent with patient: 30 minutes ? ?Date of Admission:  12/22/2021 ?Date of Discharge: 12/26/2021 ? ?History of Present Illness: Audel Antoniewicz is a 36 YO M who reports a psychiatric history of bipolar schizophrenia and chief complaint of "constant suicidal thoughts and numerous personalities". He states that he has been depressed for his "whole life". He sleeps mostly during the day and is up at night. His appetite is up and down. He hears the voices of "jack, [name] and [name]" but finds that "[name] is the one that want me to do it [die]" and tells him to kill himself or burn in hell. ? ?HOSPITAL COURSE: ?During the patient's hospitalization, patient had extensive initial psychiatric evaluation, and follow-up psychiatric evaluations every day. Psychiatric diagnoses for which pt is being treated during this admission and medications are as follows:  ?  ?Mood/anxiety: ?Zoloft 50mg  PO daily for MDD ?Trazodone 100mg  nightly for insomnia, 50 mg TID prn for anxiety ?Psychosis: unusual/inconsistent presentation? ?Continue Haldol 5mg  PO QHS ?Substance Abuse: brief intervention provided abstinence advised.  ?CIWA with PRN lorazepam completed (not discharging on Ativan) thiamine replacement completed. ?NRT for smoking cessation ?Personality disorder: consistent application of boundaries.  ?Avoid controlled medications ?  ?Medical: PRNs for pain, constipation, indigestion available.  ?Replacing vitamin D ?Treated with Nystatin powder for tinea pedis (treatment completed) ?Pt is discharging home on the above medications with the exception of Ativan. Patient's care was discussed during the interdisciplinary team meeting every day during the hospitalization. The patient denies having side effects to  prescribed psychiatric medication. ? The patient was evaluated each day by a clinical provider to ascertain response to treatment. Improvement was noted by the patient's report of decreasing symptoms, improved sleep and appetite, affect, medication tolerance, behavior, and participation in unit programming.  Patient was asked each day to complete a self inventory noting mood, mental status, pain, new symptoms, anxiety and concerns.   ?Symptoms were reported as significantly decreased or resolved completely by discharge.  ?The patient reports that their mood is stable.  ?The patient denied having suicidal thoughts for more than 48 hours prior to discharge.  Patient denies having homicidal thoughts.  Patient denies having auditory hallucinations.  Patient denies any visual hallucinations or other symptoms of psychosis.  ?The patient was motivated to continue taking medication with a goal of continued improvement in mental health. The patient reports their target psychiatric symptoms of denies responded well to the psychiatric medications, and the patient reports overall benefit other psychiatric hospitalization. Supportive psychotherapy was provided to the patient. The patient also participated in regular group therapy while hospitalized. Coping skills, problem solving as well as relaxation therapies were also part of the unit programming. ?  ?Labs were reviewed with the patient, and abnormal results were discussed with the patient. CR is slightly elevated at 1.33, pt is agreeable to following this up with his PCP on discharge. ?  ?Principal Problem: Major depressive disorder, recurrent episode, severe, with psychosis ([place]) ?Discharge Diagnoses: Principal Problem: ?  Major depressive disorder, recurrent episode, severe, with psychosis ([place]) ?Active Problems: ?  Moderate alcohol use disorder ([facility]) ?  Moderate cannabis use disorder ([facility]) ?  Nicotine use disorder ?  Anxiety disorder, unspecified ?  Personality disorder  ([place]) ?  Tinea pedis of both  feet ?  Vitamin D deficiency ? ?Past Psychiatric History: As above ? ?Past Medical History:  ?Past Medical History:  ?Diagnosis Date  ? Anxiety   ? panic and SOB  ?  ?Past Surgical History:  ?Procedure Laterality Date  ? IRRIGATION AND DEBRIDEMENT ABSCESS N/A 08/04/2020  ? Procedure: IRRIGATION AND DEBRIDEMENT PERINEAL ABSCESS, [place];  Surgeon: Irine Seal, MD;  Location: [facility];  Service: Urology;  Laterality: N/A;  ? IRRIGATION AND DEBRIDEMENT ABSCESS N/A 10/20/2020  ? Procedure: IRRIGATION AND DEBRIDEMENT PERINEAL  ABSCESS;  Surgeon: Alexis Frock, MD;  Location: [facility];  Service: Urology;  Laterality: N/A;  ? IRRIGATION AND DEBRIDEMENT BUTTOCKS N/A 10/28/2020  ? Procedure: IRRIGATION AND DEBRIDEMENT PERINEAL ABCESS;  Surgeon: Coralie Keens, MD;  Location: [facility];  Service: General;  Laterality: N/A;  packing 2" gauze  ? TRANSURETHRAL RESECTION OF PROSTATE  07/21/2020  ? Procedure: TRANSURETHRAL RESECTION OF THE PROSTATE (TURP);  Surgeon: Ceasar Mons, MD;  Location: [facility];  Service: Urology;;  ? TRANSURETHRAL RESECTION OF PROSTATE N/A 10/20/2020  ? Procedure: CYSTOSCOPY, TRANSURETHRAL RESECTION OF THE PROSTATE ABCESS(TURP);  Surgeon: Alexis Frock, MD;  Location: [facility];  Service: Urology;  Laterality: N/A;  ? ?Family History: History reviewed. No pertinent family history. ?Family Psychiatric  History: none provided ?Social History:  ?Social History  ? ?Substance and Sexual Activity  ?Alcohol Use Yes  ?   ?Social History  ? ?Substance and Sexual Activity  ?Drug Use Yes  ? Types: Marijuana  ?  ?Social History  ? ?Socioeconomic History  ? Marital status: Single  ?  Spouse name: Not on file  ? Number of children: Not on file  ? Years of education: Not on file  ? Highest education level: Not on file  ?Occupational History  ? Not on file  ?Tobacco Use  ? Smoking status: Every Day  ?  Packs/day: 1.00  ?  Types: Cigarettes  ? Smokeless tobacco: Never   ?Vaping Use  ? Vaping Use: Never used  ?Substance and Sexual Activity  ? Alcohol use: Yes  ? Drug use: Yes  ?  Types: Marijuana  ? Sexual activity: Not Currently  ?Other Topics Concern  ? Not on file  ?Social History Narrative  ? Not on file  ? ?Social Determinants of Health  ? ?Financial Resource Strain: Not on file  ?Food Insecurity: Not on file  ?Transportation Needs: Not on file  ?Physical Activity: Not on file  ?Stress: Not on file  ?Social Connections: Not on file  ? ?Physical Findings: ?AIMS: Facial and Oral Movements ?Muscles of Facial Expression: None, normal ?Lips and Perioral Area: None, normal ?Jaw: None, normal ?Tongue: None, normal,Extremity Movements ?Upper (arms, wrists, hands, fingers): None, normal ?Lower (legs, knees, ankles, toes): None, normal, Trunk Movements ?Neck, shoulders, hips: None, normal, Overall Severity ?Severity of abnormal movements (highest score from questions above): None, normal ?Incapacitation due to abnormal movements: None, normal ?Patient's awareness of abnormal movements (rate only patient's report): No Awareness, Dental Status ?Current problems with teeth and/or dentures?: No ?Does patient usually wear dentures?: No  ?CIWA:  CIWA-Ar Total: 1 ?COWS:   n/a ? ?Musculoskeletal: ?Strength & Muscle Tone: within normal limits ?Gait & Station: normal ?Patient leans: N/A ? ?Psychiatric Specialty Exam: ? ?Presentation  ?General Appearance: Appropriate for Environment ? ?Eye Contact:Good ? ?Speech:Clear and Coherent ? ?Speech Volume:Normal ? ?Handedness:Right ? ?Mood and Affect  ?Mood:Euthymic ? ?Affect:Appropriate ? ?Thought Process  ?Thought Processes:Coherent ? ?Descriptions of Associations:Intact ? ?Orientation:Full (Time, Place and Person) ? ?  Thought Content:Logical ? ?History of Schizophrenia/Schizoaffective disorder:No data recorded ?Duration of Psychotic Symptoms:Greater than six months ? ?Hallucinations:No data recorded ?Ideas of Reference:None ? ?Suicidal Thoughts:No data  recorded ?Homicidal Thoughts:No data recorded ? ?Sensorium  ?Memory:Immediate Good ? ?Judgment:Good ? ?Insight:Good ? ?Executive Functions  ?Concentration:Good ? ?Attention Span:Good ? ?Recall:Good ? ?[employer]

## 2022-01-06 ENCOUNTER — Telehealth (HOSPITAL_COMMUNITY): Payer: Self-pay | Admitting: Emergency Medicine

## 2022-01-06 NOTE — BH Assessment (Signed)
Care Management - Follow Up Bound[facility] Discharges  ? ?Patient has been placed in an inpatient psychiatric hospital ([facility]) on 12-23-2018. ?

## 2022-05-26 ENCOUNTER — Emergency Department (HOSPITAL_COMMUNITY): Payer: Self-pay

## 2022-05-26 ENCOUNTER — Emergency Department (HOSPITAL_COMMUNITY)
Admission: EM | Admit: 2022-05-26 | Discharge: 2022-05-26 | Disposition: A | Discharge status: Home / Self Care | Payer: Self-pay | Attending: Emergency Medicine | Admitting: Emergency Medicine

## 2022-05-26 ENCOUNTER — Encounter (HOSPITAL_COMMUNITY): Payer: Self-pay

## 2022-05-26 DIAGNOSIS — M67431 Ganglion, right wrist: Secondary | ICD-10-CM | POA: Insufficient documentation

## 2022-05-26 MED ORDER — MELOXICAM 7.5 MG PO TABS: 7.5000 mg | ORAL_TABLET | Freq: Every day | ORAL | 0 refills | Status: DC

## 2022-05-26 MED ORDER — HYDROCODONE-ACETAMINOPHEN 5-325 MG PO TABS
1.0000 | ORAL_TABLET | Freq: Once | ORAL | Status: AC
  Administered 2022-05-26: 1 via ORAL
  Filled 2022-05-26: qty 1

## 2022-05-26 NOTE — ED Provider Notes (Signed)
[facility]-EMERGENCY DEPT Provider Note   CSN: [id number] Arrival date & time: 05/26/22  1510     History  Chief Complaint  Patient presents with   Hand Pain    Tyler [name] is a 36 y.o. male.  Patient is a 36 year old male who presents today with complaints of right wrist pain. He states that he has had a cyst in his right wrist for many years and it has never given him any issues. States that yesterday evening it was painful and into today as well. Presents for same. Denies any fevers or chills. States that the area is not draining but is slightly larger he thinks. Denies any history of diabetes. Denies any numbness or tingling in his hand.  The history is provided by the patient. No language interpreter was used.  Hand Pain       Home Medications Prior to Admission medications   Medication Sig Start Date End Date Taking? Authorizing Provider  haloperidol (HALDOL) 5 MG tablet Take 1 tablet (5 mg total) by mouth at bedtime. 12/26/21   Roselle Locus, MD  hydrOXYzine (ATARAX) 50 MG tablet Take 1 tablet (50 mg total) by mouth every 6 (six) hours as needed for anxiety. 12/26/21   Roselle Locus, MD  nicotine polacrilex (NICORETTE) 2 MG gum Take 1 each (2 mg total) by mouth as needed for smoking cessation. 12/26/21   Roselle Locus, MD  sertraline (ZOLOFT) 50 MG tablet Take 1 tablet (50 mg total) by mouth daily. 12/27/21   Roselle Locus, MD  traZODone (DESYREL) 100 MG tablet Take 1 tablet (100 mg total) by mouth at bedtime. 12/26/21   Roselle Locus, MD  Vitamin D, Ergocalciferol, (DRISDOL) 1.25 MG (50000 UNIT) CAPS capsule Take 1 capsule (50,000 Units total) by mouth every 7 (seven) days. 01/01/22   Roselle Locus, MD      Allergies    Patient has no known allergies.    Review of Systems   Review of Systems  All other systems reviewed and are negative.   Physical Exam Updated Vital Signs BP (!) 128/99 (BP Location:  Left Arm)   Pulse 74   Temp 99.1 F (37.3 C) (Oral)   Resp 18   Ht 6' (1.829 m)   Wt 83.9 kg   SpO2 97%   BMI 25.09 kg/m  Physical Exam Vitals and nursing note reviewed.  Constitutional:      General: He is not in acute distress.    Appearance: Normal appearance. He is normal weight. He is not ill-appearing, toxic-appearing or diaphoretic.  HENT:     Head: Normocephalic and atraumatic.  Cardiovascular:     Rate and Rhythm: Normal rate.  Pulmonary:     Effort: Pulmonary effort is normal. No respiratory distress.  Musculoskeletal:        General: Normal range of motion.     Cervical back: Normal range of motion.     Comments: Firm, well-circumscribed freely mobile lump present on the dorsal aspect of the right wrist. No fluctuance or induration.  Minimal tenderness to palpation.  No drainage present.  No overlying skin changes, erythema, or warmth.  Capillary refill less than 2 seconds.  Full ROM intact with some discomfort.  Radial pulses intact and 2+.  Skin:    General: Skin is warm and dry.  Neurological:     General: No focal deficit present.     Mental Status: He is alert.  Psychiatric:  Mood and Affect: Mood normal.        Behavior: Behavior normal.     ED Results / Procedures / Treatments   Labs (all labs ordered are listed, but only abnormal results are displayed) Labs Reviewed - No data to display  EKG None  Radiology DG Wrist Complete Right  Result Date: 05/26/2022 CLINICAL DATA:  Hand pain. Patient reports he has had an internal cyst to right wrist area "for quite some time" but yesterday it began increasing in pain. EXAM: RIGHT WRIST - COMPLETE 3+ VIEW COMPARISON:  None Available. FINDINGS: Normal bone mineralization. Neutral ulnar variance. Joint spaces are preserved. No acute fracture is seen. No dislocation. IMPRESSION: Normal right wrist radiographs. Electronically Signed   By: Neita Garnet M.D.   On: 05/26/2022 16:31    Procedures Procedures     Medications Ordered in ED Medications  HYDROcodone-acetaminophen (NORCO/VICODIN) 5-325 MG per tablet 1 tablet (1 tablet Oral Given 05/26/22 1939)    ED Course/ Medical Decision Making/ A&P                           Medical Decision Making Amount and/or Complexity of Data Reviewed Radiology: ordered.  Risk Prescription drug management.   Patient presents today with complaints of right hand pain.  He is afebrile, nontoxic-appearing, and in no acute distress with reassuring vital signs.  Physical exam reveals a firm, well-circumscribed and freely mobile cystic structure present on the dorsal aspect of the right wrist.  This is consistent with a ganglion cyst.  There is no fluctuance or induration, no erythema, warmth, or drainage.  Very low suspicion for abscess or infected cyst.  X-ray imaging obtained of the right wrist which was unremarkable for acute findings.  No concern for septic joint.  Given that it has been present for some time, suspect that this is a ganglion cyst that has become inflamed and is causing the patient's pain.  Given a brace for support and will send home with NSAIDs for symptomatic relief.  Will also recommend RICE.  I have given him a referral to hand surgery so he can have this removed.  Patient is understanding and amenable with plan, educated on red flag symptoms that would prompt immediate return.  Discharged in stable condition.   Final Clinical Impression(s) / ED Diagnoses Final diagnoses:  Ganglion cyst of dorsum of right wrist    Rx / DC Orders ED Discharge Orders          Ordered    meloxicam (MOBIC) 7.5 MG tablet  Daily        05/26/22 2020          An After Visit Summary was printed and given to the patient.     Vear Clock 05/26/22 2021    Mancel Bale, MD 05/27/22 [id number]

## 2022-05-26 NOTE — ED Notes (Signed)
I provided reinforced discharge education based off of after visit summary/care provided. Pt acknowledged and understood my education. Pt had no further questions/concerns for provider/myself. After visit summary provided to pt. 

## 2022-05-26 NOTE — ED Triage Notes (Signed)
Pt states he has had internal cyst to his R wrist area "for quite some time" but yesterday it began increasing in pain. Pt able to move extremity freely. PMS intact

## 2022-05-26 NOTE — Discharge Instructions (Addendum)
As we discussed, I suspect that your symptoms are from a ganglion cyst that is inflamed.  I have given you a prescription for anti-inflammatory medication to help with your symptoms.  I have also given you a brace for support.  I recommend that you rest, ice, compress, and elevate your wrist for symptomatic relief.  If it continues to bother you, you will need to follow-up with a hand specialist to have it removed.  I have given you a referral with a number to call to schedule an appointment to have this done.  Return if development of any new or worsening symptoms.

## 2022-09-19 ENCOUNTER — Emergency Department (HOSPITAL_COMMUNITY): Payer: Commercial Managed Care - HMO

## 2022-09-19 ENCOUNTER — Encounter (HOSPITAL_COMMUNITY): Payer: Self-pay

## 2022-09-19 ENCOUNTER — Emergency Department (HOSPITAL_COMMUNITY)
Admission: EM | Admit: 2022-09-19 | Discharge: 2022-09-20 | Discharge status: Against Medical Advice | Payer: Commercial Managed Care - HMO | Attending: Emergency Medicine | Admitting: Emergency Medicine

## 2022-09-19 DIAGNOSIS — R1084 Generalized abdominal pain: Secondary | ICD-10-CM | POA: Insufficient documentation

## 2022-09-19 DIAGNOSIS — R197 Diarrhea, unspecified: Secondary | ICD-10-CM | POA: Diagnosis not present

## 2022-09-19 DIAGNOSIS — Z1152 Encounter for screening for COVID-19: Secondary | ICD-10-CM | POA: Diagnosis not present

## 2022-09-19 DIAGNOSIS — Z5321 Procedure and treatment not carried out due to patient leaving prior to being seen by health care provider: Secondary | ICD-10-CM | POA: Diagnosis not present

## 2022-09-19 DIAGNOSIS — R110 Nausea: Secondary | ICD-10-CM | POA: Diagnosis not present

## 2022-09-19 DIAGNOSIS — R509 Fever, unspecified: Secondary | ICD-10-CM | POA: Insufficient documentation

## 2022-09-19 LAB — RESP PANEL BY RT-PCR (FLU A&B, COVID) ARPGX2
Influenza A by PCR: NEGATIVE
Influenza B by PCR: NEGATIVE
SARS Coronavirus 2 by RT PCR: NEGATIVE

## 2022-09-19 NOTE — ED Triage Notes (Signed)
Pt arrived via POV, c/o right sided pain, nausea, chills. States diffuse stomach pain. Denies any fevers, dysuria.

## 2022-09-19 NOTE — ED Provider Triage Note (Signed)
Emergency Medicine Provider Triage Evaluation Note  Tyler [name] , a 36 y.o. male  was evaluated in triage.  Pt complains of fever and chest pain x3 days. Patient states that chest pain after severe episodes of coughing.  Reports that he is alternating between fever and chills and having trouble breathing due to the pain noted on his right side. Associated diarrhea, but no focal abdominal tenderness. Denies chest pain, rashes, altered mental status, confusion, or weight loss.  Review of Systems  Positive: As above Negative: As above  Physical Exam  BP 139/81 (BP Location: Right Arm)   Pulse 63   Temp 98.5 F (36.9 C) (Oral)   Resp 18   SpO2 99%  Gen:   Awake, uncomfortable while sitting in chair Resp:  Normal effort, difficulty taking full breath in due to pain on right side MSK:   Moves extremities without difficulty Other:  No obvious murmurs.  Lung sounds difficult to hear due to patient taking shallow breaths.  Medical Decision Making  Medically screening exam initiated at 1:37 PM.  Appropriate orders placed.  Jason Coop was informed that the remainder of the evaluation will be completed by another provider, this initial triage assessment does not replace that evaluation, and the importance of remaining in the ED until their evaluation is complete.    Smitty Knudsen, PA-C 09/19/22 1345

## 2022-09-20 ENCOUNTER — Emergency Department (HOSPITAL_COMMUNITY)
Admission: EM | Admit: 2022-09-20 | Discharge: 2022-09-20 | Disposition: A | Discharge status: Home / Self Care | Payer: Commercial Managed Care - HMO | Source: Home / Self Care | Attending: Emergency Medicine | Admitting: Emergency Medicine

## 2022-09-20 ENCOUNTER — Encounter (HOSPITAL_COMMUNITY): Payer: Self-pay

## 2022-09-20 ENCOUNTER — Other Ambulatory Visit: Payer: Self-pay

## 2022-09-20 DIAGNOSIS — J209 Acute bronchitis, unspecified: Secondary | ICD-10-CM | POA: Insufficient documentation

## 2022-09-20 MED ORDER — ALBUTEROL SULFATE HFA 108 (90 BASE) MCG/ACT IN AERS
2.0000 | INHALATION_SPRAY | Freq: Once | RESPIRATORY_TRACT | Status: AC
  Administered 2022-09-20: 2 via RESPIRATORY_TRACT
  Filled 2022-09-20: qty 6.7

## 2022-09-20 NOTE — ED Triage Notes (Signed)
Patient said he has had diarrhea, cold, hot, stomach aches since Friday. Was seen last night and tested for covid, but he left and didn't get the results.

## 2022-09-20 NOTE — ED Provider Notes (Signed)
[facility]-EMERGENCY DEPT Provider Note   CSN: [id number] Arrival date & time: 09/20/22  1559     History  Chief Complaint  Patient presents with   Generalized Body Aches         Tyler [name] is a 36 y.o. male.  Patient comes to the ER today to get results from last night.  Patient left without being evaluated.  He is complaining of cough and flulike symptoms.  He was concerned he might have COVID.  Did not denies fever chills or shortness of breath  HPI     Home Medications Prior to Admission medications   Medication Sig Start Date End Date Taking? Authorizing Provider  haloperidol (HALDOL) 5 MG tablet Take 1 tablet (5 mg total) by mouth at bedtime. 12/26/21   Roselle Locus, MD  hydrOXYzine (ATARAX) 50 MG tablet Take 1 tablet (50 mg total) by mouth every 6 (six) hours as needed for anxiety. 12/26/21   Roselle Locus, MD  meloxicam (MOBIC) 7.5 MG tablet Take 1 tablet (7.5 mg total) by mouth daily. 05/26/22   Smoot, Shawn Route, PA-C  nicotine polacrilex (NICORETTE) 2 MG gum Take 1 each (2 mg total) by mouth as needed for smoking cessation. 12/26/21   Roselle Locus, MD  sertraline (ZOLOFT) 50 MG tablet Take 1 tablet (50 mg total) by mouth daily. 12/27/21   Roselle Locus, MD  traZODone (DESYREL) 100 MG tablet Take 1 tablet (100 mg total) by mouth at bedtime. 12/26/21   Roselle Locus, MD  Vitamin D, Ergocalciferol, (DRISDOL) 1.25 MG (50000 UNIT) CAPS capsule Take 1 capsule (50,000 Units total) by mouth every 7 (seven) days. 01/01/22   Roselle Locus, MD      Allergies    Patient has no known allergies.    Review of Systems   Review of Systems  Physical Exam Updated Vital Signs BP (!) 140/106 (BP Location: Right Arm)   Pulse 71   Temp 98.3 F (36.8 C) (Oral)   Resp 18   SpO2 98%  Physical Exam Vitals and nursing note reviewed.  Constitutional:      General: He is not in acute distress.    Appearance: He is  well-developed. He is not diaphoretic.  HENT:     Head: Normocephalic and atraumatic.  Eyes:     General: No scleral icterus.    Conjunctiva/sclera: Conjunctivae normal.  Cardiovascular:     Rate and Rhythm: Normal rate and regular rhythm.     Heart sounds: Normal heart sounds.  Pulmonary:     Effort: Pulmonary effort is normal. No respiratory distress.     Breath sounds: Normal breath sounds.  Abdominal:     Palpations: Abdomen is soft.     Tenderness: There is no abdominal tenderness.  Musculoskeletal:     Cervical back: Normal range of motion and neck supple.  Skin:    General: Skin is warm and dry.  Neurological:     Mental Status: He is alert.  Psychiatric:        Behavior: Behavior normal.     ED Results / Procedures / Treatments   Labs (all labs ordered are listed, but only abnormal results are displayed) Labs Reviewed - No data to display  EKG None  Radiology DG Chest 2 View  Result Date: 09/19/2022 CLINICAL DATA:  Shortness of breath EXAM: CHEST - 2 VIEW COMPARISON:  07/21/2020 FINDINGS: The heart size and mediastinal contours are within normal limits. Both lungs are  clear. The visualized skeletal structures are unremarkable. IMPRESSION: No acute abnormality of the lungs. Electronically Signed   By: Delanna Ahmadi M.D.   On: 09/19/2022 13:52    Procedures Procedures    Medications Ordered in ED Medications  albuterol (VENTOLIN HFA) 108 (90 Base) MCG/ACT inhaler 2 puff (2 puffs Inhalation Given 09/20/22 1652)    ED Course/ Medical Decision Making/ A&P                           Medical Decision Making Risk Prescription drug management.   I visualized and interpreted a chest x-ray shows no acute findings.  Patient also negative for COVID.  Will treat with albuterol.  Patient appears otherwise appropriate for discharge at this time he is hemodynamically stable without hypoxia.        Final Clinical Impression(s) / ED Diagnoses Final diagnoses:   Acute bronchitis, unspecified organism    Rx / DC Orders ED Discharge Orders     None         Margarita Mail, PA-C 09/20/22 1652    Carmin Muskrat, MD 09/20/22 2238

## 2022-09-20 NOTE — Discharge Instructions (Signed)
Get help right away if you: Cough up blood. Feel pain in your chest. Have severe shortness of breath. Faint or keep feeling like you are going to faint. Have a severe headache. Have a fever or chills that get worse. 

## 2023-01-26 ENCOUNTER — Emergency Department: Payer: Self-pay

## 2023-01-26 ENCOUNTER — Encounter: Payer: Self-pay | Admitting: Emergency Medicine

## 2023-01-26 DIAGNOSIS — N390 Urinary tract infection, site not specified: Secondary | ICD-10-CM | POA: Insufficient documentation

## 2023-01-26 LAB — COMPREHENSIVE METABOLIC PANEL
ALT: 25 U/L (ref 0–44)
AST: 37 U/L (ref 15–41)
Albumin: 3.8 g/dL (ref 3.5–5.0)
Alkaline Phosphatase: 42 U/L (ref 38–126)
Anion gap: 7 (ref 5–15)
BUN: 9 mg/dL (ref 6–20)
CO2: 25 mmol/L (ref 22–32)
Calcium: 8.5 mg/dL — ABNORMAL LOW (ref 8.9–10.3)
Chloride: 103 mmol/L (ref 98–111)
Creatinine, Ser: 1.12 mg/dL (ref 0.61–1.24)
GFR, Estimated: >60 mL/min (ref >60)
Glucose, Bld: 119 mg/dL — ABNORMAL HIGH (ref 70–99)
Potassium: 3.4 mmol/L — ABNORMAL LOW (ref 3.5–5.1)
Sodium: 135 mmol/L (ref 135–145)
Total Bilirubin: 0.6 mg/dL (ref 0.3–1.2)
Total Protein: 6.8 g/dL (ref 6.5–8.1)

## 2023-01-26 LAB — CBC WITH DIFFERENTIAL/PLATELET
Abs Immature Granulocytes: 0.01 10*3/uL (ref 0.00–0.07)
Basophils Absolute: 0 10*3/uL (ref 0.0–0.1)
Basophils Relative: 0 %
Eosinophils Absolute: 0.1 10*3/uL (ref 0.0–0.5)
Eosinophils Relative: 2 %
HCT: 41.8 % (ref 39.0–52.0)
Hemoglobin: 14 g/dL (ref 13.0–17.0)
Immature Granulocytes: 0 %
Lymphocytes Relative: 39 %
Lymphs Abs: 2.2 10*3/uL (ref 0.7–4.0)
MCH: 28.5 pg (ref 26.0–34.0)
MCHC: 33.5 g/dL (ref 30.0–36.0)
MCV: 85 fL (ref 80.0–100.0)
Monocytes Absolute: 0.7 10*3/uL (ref 0.1–1.0)
Monocytes Relative: 12 %
Neutro Abs: 2.6 10*3/uL (ref 1.7–7.7)
Neutrophils Relative %: 47 %
Platelets: 183 10*3/uL (ref 150–400)
RBC: 4.92 MIL/uL (ref 4.22–5.81)
RDW: 13.9 % (ref 11.5–15.5)
WBC: 5.6 10*3/uL (ref 4.0–10.5)
nRBC: 0 % (ref 0.0–0.2)

## 2023-01-26 LAB — URINALYSIS, ROUTINE W REFLEX MICROSCOPIC
Bilirubin Urine: NEGATIVE
Glucose, UA: NEGATIVE mg/dL
Hgb urine dipstick: NEGATIVE
Ketones, ur: 5 mg/dL — AB
Nitrite: POSITIVE — AB
Protein, ur: 30 mg/dL — AB
Specific Gravity, Urine: 1.024 (ref 1.005–1.030)
WBC, UA: >50 WBC/hpf (ref 0–5)
pH: 5 (ref 5.0–8.0)

## 2023-01-26 NOTE — ED Triage Notes (Signed)
Pt presents ambulatory to triage via POV with complaints of L sided flank pain with associated N/V/D and chills for the last 2 weeks. No meds taken PTA. Endorses cold sweats last night. Rates the pain 8/10 without radiation.  A&Ox4 at this time. Denies CP or SOB.

## 2023-01-27 ENCOUNTER — Emergency Department
Admission: EM | Admit: 2023-01-27 | Discharge: 2023-01-27 | Disposition: A | Discharge status: Home / Self Care | Payer: Self-pay | Attending: Emergency Medicine | Admitting: Emergency Medicine

## 2023-01-27 DIAGNOSIS — N390 Urinary tract infection, site not specified: Secondary | ICD-10-CM

## 2023-01-27 LAB — HIV ANTIBODY (ROUTINE TESTING W REFLEX): HIV Screen 4th Generation wRfx: NONREACTIVE

## 2023-01-27 LAB — CHLAMYDIA/NGC RT PCR (ARMC ONLY)
Chlamydia Tr: NOT DETECTED
N gonorrhoeae: NOT DETECTED

## 2023-01-27 LAB — RPR: RPR Ser Ql: NONREACTIVE

## 2023-01-27 MED ORDER — KETOROLAC TROMETHAMINE 30 MG/ML IJ SOLN
30.0000 mg | Freq: Once | INTRAMUSCULAR | Status: AC
  Administered 2023-01-27: 30 mg via INTRAVENOUS
  Filled 2023-01-27: qty 1

## 2023-01-27 MED ORDER — CEPHALEXIN 500 MG PO CAPS: 500.0000 mg | ORAL_CAPSULE | Freq: Two times a day (BID) | ORAL | 0 refills | Status: DC

## 2023-01-27 MED ORDER — ONDANSETRON HCL 4 MG/2ML IJ SOLN
4.0000 mg | Freq: Once | INTRAMUSCULAR | Status: AC
  Administered 2023-01-27: 4 mg via INTRAVENOUS
  Filled 2023-01-27: qty 2

## 2023-01-27 MED ORDER — SODIUM CHLORIDE 0.9 % IV SOLN
2.0000 g | Freq: Once | INTRAVENOUS | Status: AC
  Administered 2023-01-27: 2 g via INTRAVENOUS
  Filled 2023-01-27: qty 20

## 2023-01-27 MED ORDER — ONDANSETRON 4 MG PO TBDP: 4.0000 mg | ORAL_TABLET | Freq: Four times a day (QID) | ORAL | 0 refills | Status: DC | PRN

## 2023-01-27 MED ORDER — IBUPROFEN 800 MG PO TABS: 800.0000 mg | ORAL_TABLET | Freq: Three times a day (TID) | ORAL | 0 refills | Status: DC | PRN

## 2023-01-27 NOTE — Discharge Instructions (Signed)
You may alternate Tylenol 1000 mg every 6 hours as needed for pain, fever and Ibuprofen 800 mg every 6-8 hours as needed for pain, fever.  Please take Ibuprofen with food.  Do not take more than 4000 mg of Tylenol (acetaminophen) in a 24 hour period.   Please go to the following website to schedule new (and existing) patient appointments:   [email or web address]   The following is a list of primary care offices in the area who are accepting new patients at this time.  Please reach out to one of them directly and let them know you would like to schedule an appointment to follow up on an Emergency Department visit, and/or to establish a new primary care provider (PCP).  There are likely other primary care clinics in the are who are accepting new patients, but this is an excellent place to start:  [facility] Lead physician: Dr Angela Bacigalupo [address] [place], [place] [address] [phone]  [facility] Lead Physician: Dr Krichna Sowles [address], [place], [place] [address] [phone]  [facility]  Lead Physician: Dr Megan Johnson [address], [place], [place] [address] [phone]  [facility] [place] [facility] Lead Physician: Dr Alex Karamalegos [address], [place], [place] [address] [phone]  [facility] [place] Lead Physician: Dr Laura Berglund [address], [place], [place] [address] [phone]  

## 2023-01-27 NOTE — ED Provider Notes (Signed)
[facility] Provider Note    Event Date/Time   First MD Initiated Contact with Patient 01/27/23 0041     (approximate)   History   Flank Pain   HPI  Tyler [name] is a 37 y.o. male with history of anxiety who presents to the emergency department with complaints of 2 weeks of left lower back pain.  No dysuria, hematuria, discharge, testicular pain or swelling.  Has had vomiting.  No fever.  No diarrhea.  No known history of injury.  No numbness, tingling, weakness, incontinence.   History provided by patient, family.    Past Medical History:  Diagnosis Date   Anxiety    panic and SOB    Past Surgical History:  Procedure Laterality Date   IRRIGATION AND DEBRIDEMENT ABSCESS N/A 08/04/2020   Procedure: IRRIGATION AND DEBRIDEMENT PERINEAL ABSCESS, Lovett Sox;  Surgeon: Bjorn Pippin, MD;  Location: [facility];  Service: Urology;  Laterality: N/A;   IRRIGATION AND DEBRIDEMENT ABSCESS N/A 10/20/2020   Procedure: IRRIGATION AND DEBRIDEMENT PERINEAL  ABSCESS;  Surgeon: Sebastian Ache, MD;  Location: [facility];  Service: Urology;  Laterality: N/A;   IRRIGATION AND DEBRIDEMENT BUTTOCKS N/A 10/28/2020   Procedure: IRRIGATION AND DEBRIDEMENT PERINEAL ABCESS;  Surgeon: Abigail Miyamoto, MD;  Location: [facility];  Service: General;  Laterality: N/A;  packing 2" gauze   TRANSURETHRAL RESECTION OF PROSTATE  07/21/2020   Procedure: TRANSURETHRAL RESECTION OF THE PROSTATE (TURP);  Surgeon: Rene Paci, MD;  Location: [facility];  Service: Urology;;   TRANSURETHRAL RESECTION OF PROSTATE N/A 10/20/2020   Procedure: CYSTOSCOPY, TRANSURETHRAL RESECTION OF THE PROSTATE ABCESS(TURP);  Surgeon: Sebastian Ache, MD;  Location: [facility];  Service: Urology;  Laterality: N/A;    MEDICATIONS:  Prior to Admission medications   Medication Sig Start Date End Date Taking? Authorizing Provider  haloperidol (HALDOL) 5 MG tablet Take 1 tablet (5 mg total) by mouth at bedtime.  12/26/21   Roselle Locus, MD  hydrOXYzine (ATARAX) 50 MG tablet Take 1 tablet (50 mg total) by mouth every 6 (six) hours as needed for anxiety. 12/26/21   Roselle Locus, MD  meloxicam (MOBIC) 7.5 MG tablet Take 1 tablet (7.5 mg total) by mouth daily. 05/26/22   Smoot, Shawn Route, PA-C  nicotine polacrilex (NICORETTE) 2 MG gum Take 1 each (2 mg total) by mouth as needed for smoking cessation. 12/26/21   Roselle Locus, MD  sertraline (ZOLOFT) 50 MG tablet Take 1 tablet (50 mg total) by mouth daily. 12/27/21   Roselle Locus, MD  traZODone (DESYREL) 100 MG tablet Take 1 tablet (100 mg total) by mouth at bedtime. 12/26/21   Roselle Locus, MD  Vitamin D, Ergocalciferol, (DRISDOL) 1.25 MG (50000 UNIT) CAPS capsule Take 1 capsule (50,000 Units total) by mouth every 7 (seven) days. 01/01/22   Roselle Locus, MD    Physical Exam   Triage Vital Signs: ED Triage Vitals  Enc Vitals Group     BP 01/26/23 2037 (!) 140/96     Pulse Rate 01/26/23 2037 83     Resp 01/26/23 2037 18     Temp 01/26/23 2037 98.3 F (36.8 C)     Temp src --      SpO2 01/26/23 2037 100 %     Weight 01/26/23 2038 208 lb (94.3 kg)     Height 01/26/23 2038 6' (1.829 m)     Head Circumference --      Peak Flow --  Pain Score --      Pain Loc --      Pain Edu? --      Excl. in GC? --     Most recent vital signs: Vitals:   01/26/23 2037  BP: (!) 140/96  Pulse: 83  Resp: 18  Temp: 98.3 F (36.8 C)  SpO2: 100%    CONSTITUTIONAL: Alert, responds appropriately to questions. Well-appearing; well-nourished HEAD: Normocephalic, atraumatic EYES: Conjunctivae clear, pupils appear equal, sclera nonicteric ENT: normal nose; moist mucous membranes NECK: Supple, normal ROM CARD: RRR; S1 and S2 appreciated RESP: Normal chest excursion without splinting or tachypnea; breath sounds clear and equal bilaterally; no wheezes, no rhonchi, no rales, no hypoxia or respiratory distress, speaking  full sentences ABD/GI: Non-distended; soft, non-tender, no rebound, no guarding, no peritoneal signs, no CVA tenderness BACK: The back appears normal EXT: Normal ROM in all joints; no deformity noted, no edema SKIN: Normal color for age and race; warm; no rash on exposed skin NEURO: Moves all extremities equally, normal speech PSYCH: The patient's mood and manner are appropriate.   ED Results / Procedures / Treatments   LABS: (all labs ordered are listed, but only abnormal results are displayed) Labs Reviewed  COMPREHENSIVE METABOLIC PANEL - Abnormal; Notable for the following components:      Result Value   Potassium 3.4 (*)    Glucose, Bld 119 (*)    Calcium 8.5 (*)    All other components within normal limits  URINALYSIS, ROUTINE W REFLEX MICROSCOPIC - Abnormal; Notable for the following components:   Color, Urine YELLOW (*)    APPearance CLOUDY (*)    Ketones, ur 5 (*)    Protein, ur 30 (*)    Nitrite POSITIVE (*)    Leukocytes,Ua LARGE (*)    Bacteria, UA RARE (*)    All other components within normal limits  URINE CULTURE  CHLAMYDIA/NGC RT PCR ([facility] ONLY)            CBC WITH DIFFERENTIAL/PLATELET  HIV ANTIBODY (ROUTINE TESTING W REFLEX)  RPR     EKG:  RADIOLOGY: My personal review and interpretation of imaging: CT scan shows no acute abnormality.  I have personally reviewed all radiology reports.   CT Renal Stone Study  Result Date: 01/26/2023 CLINICAL DATA:  Left-sided flank pain for 2 weeks EXAM: CT ABDOMEN AND PELVIS WITHOUT CONTRAST TECHNIQUE: Multidetector CT imaging of the abdomen and pelvis was performed following the standard protocol without IV contrast. RADIATION DOSE REDUCTION: This exam was performed according to the departmental dose-optimization program which includes automated exposure control, adjustment of the mA and/or kV according to patient size and/or use of iterative reconstruction technique. COMPARISON:  10/27/2020 FINDINGS: Lower chest: No  acute abnormality. Hepatobiliary: No focal liver abnormality is seen. No gallstones, gallbladder wall thickening, or biliary dilatation. Pancreas: Unremarkable. No pancreatic ductal dilatation or surrounding inflammatory changes. Spleen: Normal in size without focal abnormality. Adrenals/Urinary Tract: Adrenal glands are within normal limits. Kidneys are well visualize without renal calculi or obstructive changes. The bladder is decompressed. Stomach/Bowel: Stomach is within normal limits. Appendix appears normal. No evidence of bowel wall thickening, distention, or inflammatory changes. Vascular/Lymphatic: Aortic atherosclerosis. No enlarged abdominal or pelvic lymph nodes. Reproductive: Prostate is unremarkable. Other: No abdominal wall hernia or abnormality. No abdominopelvic ascites. Musculoskeletal: No acute or significant osseous findings. IMPRESSION: No acute abnormality noted. Electronically Signed   By: Alcide Clever M.D.   On: 01/26/2023 20:56     PROCEDURES:  Critical Care  performed: No     Procedures    IMPRESSION / MDM / ASSESSMENT AND PLAN / ED COURSE  I reviewed the triage vital signs and the nursing notes.    Patient here with left lower back pain.    DIFFERENTIAL DIAGNOSIS (includes but not limited to):   Musculoskeletal pain, doubt cauda equina, epidural abscess or hematoma, discitis or osteomyelitis, fracture.  Differential also includes kidney stone, pyelonephritis, ascending UTI.   Patient's presentation is most consistent with acute complicated illness / injury requiring diagnostic workup.   PLAN: Labs, urine, CT done from triage.  No leukocytosis.  Normal creatinine.  Urine positive for UTI.  Culture pending.  Have offered STD screening which patient agrees to today but he denies any known concerns for STIs.  Will hold empiric treatment.  CT renal study shows no acute abnormality when reviewed and interpreted by myself and the radiologist.  No kidney stone,  pyelonephritis, appendicitis.  Will give Rocephin, Toradol, Zofran here.  Will discharge on Keflex.  Given PCP follow-up.   MEDICATIONS GIVEN IN ED: Medications  cefTRIAXone (ROCEPHIN) 2 g in sodium chloride 0.9 % 100 mL IVPB (has no administration in time range)  ketorolac (TORADOL) 30 MG/ML injection 30 mg (has no administration in time range)  ondansetron (ZOFRAN) injection 4 mg (has no administration in time range)     ED COURSE:  At this time, I do not feel there is any life-threatening condition present. I reviewed all nursing notes, vitals, pertinent previous records.  All lab and urine results, EKGs, imaging ordered have been independently reviewed and interpreted by myself.  I reviewed all available radiology reports from any imaging ordered this visit.  Based on my assessment, I feel the patient is safe to be discharged home without further emergent workup and can continue workup as an outpatient as needed. Discussed all findings, treatment plan as well as usual and customary return precautions.  They verbalize understanding and are comfortable with this plan.  Outpatient follow-up has been provided as needed.  All questions have been answered.    CONSULTS:  none   OUTSIDE RECORDS REVIEWED: Reviewed previous urology note 2022 for TURP and I&D of perineal abscess.       FINAL CLINICAL IMPRESSION(S) / ED DIAGNOSES   Final diagnoses:  Acute UTI     Rx / DC Orders   ED Discharge Orders          Ordered    cephALEXin (KEFLEX) 500 MG capsule  2 times daily        01/27/23 0125    ibuprofen (ADVIL) 800 MG tablet  Every 8 hours PRN        01/27/23 0125    ondansetron (ZOFRAN-ODT) 4 MG disintegrating tablet  Every 6 hours PRN        01/27/23 0125             Note:  This document was prepared using Dragon voice recognition software and may include unintentional dictation errors.   Jenisha Faison, Layla Maw, DO 01/27/23 [phone]

## 2023-01-29 LAB — URINE CULTURE: Culture: >=100000 — AB

## 2023-03-27 ENCOUNTER — Ambulatory Visit (HOSPITAL_COMMUNITY)
Admission: EM | Admit: 2023-03-27 | Discharge: 2023-03-27 | Disposition: A | Discharge status: Home / Self Care | Payer: No Payment, Other

## 2023-03-27 NOTE — Progress Notes (Signed)
   03/27/23 1135  [facility] Triage Screening (Walk-ins at [facility] only)  How Did You Hear About [place]? Family/Friend  What Is the Reason for Your Visit/Call Today? Tyler [name] is a 37 year old male presenting voluntary as a walk-in to [facility] seeking a mental health evaluation and medication refill. Pt reports his probation officer referred him to this facility for a mental health evaluation as a requirement of his program. Pt states he is also interested in therapy and psychiatry services to restart his psychiatric medication. Pt states he has been without medication for aleast 1 year. Pt denies SI/HI and AVH currently.  How Long Has This Been Causing You Problems? <Week  Have You Recently Had Any Thoughts About Hurting Yourself? No  Are You Planning to Commit Suicide/Harm Yourself At This time? No  Have you Recently Had Thoughts About Hurting Someone Karolee Ohs? No  Are You Planning To Harm Someone At This Time? No  Are you currently experiencing any auditory, visual or other hallucinations? No  Have You Used Any Alcohol or Drugs in the Past 24 Hours? No  Do you have any current medical co-morbidities that require immediate attention? No  Clinician description of patient physical appearance/behavior: calm, cooperative  What Do You Feel Would Help You the Most Today? Treatment for Depression or other mood problem  If access to [facility] was not available, would you have sought care in the Emergency Department? No  Determination of Need Routine (7 days)  Options For Referral Outpatient Therapy;Medication Management

## 2023-04-14 ENCOUNTER — Telehealth (HOSPITAL_COMMUNITY): Payer: Self-pay | Admitting: Licensed Clinical Social Worker

## 2023-04-14 NOTE — Telephone Encounter (Signed)
The therapist responds to a ROI sent by Ms. Tyler [name] with [facility] with the following, secure email:  "Our program has had no contact with Tyler [name] since March of 2023."  [address], [place], [place], [place], LCAS 04/14/2023

## 2023-05-17 ENCOUNTER — Telehealth (HOSPITAL_COMMUNITY): Payer: Self-pay | Admitting: Licensed Clinical Social Worker

## 2023-05-17 NOTE — Telephone Encounter (Signed)
The therapist responds to a request for information from Ms. Lurline Del with [facility] who sends a ROI with the following, secure email:   "Good afternoon:   "We have continued to have no contact with Mr. Oh since March of 2023."   Myrna Blazer, [place], LCSW, [facility], LCAS 05/17/2023

## 2023-06-02 ENCOUNTER — Emergency Department
Admission: EM | Admit: 2023-06-02 | Discharge: 2023-06-02 | Disposition: A | Discharge status: Home / Self Care | Payer: 59 | Attending: Emergency Medicine | Admitting: Emergency Medicine

## 2023-06-02 ENCOUNTER — Telehealth (HOSPITAL_COMMUNITY): Payer: Self-pay

## 2023-06-02 ENCOUNTER — Other Ambulatory Visit: Payer: Self-pay

## 2023-06-02 DIAGNOSIS — R440 Auditory hallucinations: Secondary | ICD-10-CM | POA: Diagnosis not present

## 2023-06-02 LAB — CBC
HCT: 42.1 % (ref 39.0–52.0)
Hemoglobin: 14.1 g/dL (ref 13.0–17.0)
MCH: 28 pg (ref 26.0–34.0)
MCHC: 33.5 g/dL (ref 30.0–36.0)
MCV: 83.7 fL (ref 80.0–100.0)
Platelets: 175 10*3/uL (ref 150–400)
RBC: 5.03 MIL/uL (ref 4.22–5.81)
RDW: 15.4 % (ref 11.5–15.5)
WBC: 5.9 10*3/uL (ref 4.0–10.5)
nRBC: 0 % (ref 0.0–0.2)

## 2023-06-02 LAB — COMPREHENSIVE METABOLIC PANEL
ALT: 24 U/L (ref 0–44)
AST: 34 U/L (ref 15–41)
Albumin: 3.3 g/dL — ABNORMAL LOW (ref 3.5–5.0)
Alkaline Phosphatase: 36 U/L — ABNORMAL LOW (ref 38–126)
Anion gap: 8 (ref 5–15)
BUN: 11 mg/dL (ref 6–20)
CO2: 23 mmol/L (ref 22–32)
Calcium: 8.7 mg/dL — ABNORMAL LOW (ref 8.9–10.3)
Chloride: 107 mmol/L (ref 98–111)
Creatinine, Ser: 1.05 mg/dL (ref 0.61–1.24)
GFR, Estimated: >60 mL/min (ref >60)
Glucose, Bld: 92 mg/dL (ref 70–99)
Potassium: 4.4 mmol/L (ref 3.5–5.1)
Sodium: 138 mmol/L (ref 135–145)
Total Bilirubin: 0.5 mg/dL (ref 0.3–1.2)
Total Protein: 6.2 g/dL — ABNORMAL LOW (ref 6.5–8.1)

## 2023-06-02 LAB — ETHANOL: Alcohol, Ethyl (B): <10 mg/dL (ref <10)

## 2023-06-02 LAB — SALICYLATE LEVEL: Salicylate Lvl: <7 mg/dL — ABNORMAL LOW (ref 7.0–30.0)

## 2023-06-02 LAB — ACETAMINOPHEN LEVEL: Acetaminophen (Tylenol), Serum: <10 ug/mL — ABNORMAL LOW (ref 10–30)

## 2023-06-02 MED ORDER — SERTRALINE HCL 50 MG PO TABS: 50.0000 mg | ORAL_TABLET | Freq: Every day | ORAL | 2 refills | Status: DC

## 2023-06-02 MED ORDER — HYDROXYZINE HCL 50 MG PO TABS: 50.0000 mg | ORAL_TABLET | Freq: Three times a day (TID) | ORAL | 2 refills | Status: DC | PRN

## 2023-06-02 MED ORDER — HYDROXYZINE HCL 25 MG PO TABS: 25.0000 mg | ORAL_TABLET | Freq: Three times a day (TID) | ORAL | Status: DC | PRN

## 2023-06-02 MED ORDER — TRAZODONE HCL 100 MG PO TABS: 100.0000 mg | ORAL_TABLET | Freq: Every day | ORAL | 2 refills | Status: DC

## 2023-06-02 MED ORDER — OLANZAPINE 5 MG PO TABS
5.0000 mg | ORAL_TABLET | Freq: Once | ORAL | Status: AC
  Administered 2023-06-02: 5 mg via ORAL
  Filled 2023-06-02: qty 1

## 2023-06-02 MED ORDER — OLANZAPINE 5 MG PO TABS
5.0000 mg | ORAL_TABLET | Freq: Every day | ORAL | 2 refills | Status: DC
Start: 2023-06-02 — End: 2023-11-16

## 2023-06-02 NOTE — ED Provider Notes (Signed)
[facility] Provider Note   Event Date/Time   First MD Initiated Contact with Patient 06/02/23 [phone]     (approximate) History  Psychiatric Evaluation  HPI Tyler [name] is a 37 y.o. 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  Patient states that he has been off his medications for over 2 months at this point and started having auditory hallucinations a few days ago.  Patient denies any command hallucinations.  Patient denies any suicidal or homicidal ideation. ROS: Patient currently denies any vision changes, tinnitus, difficulty speaking, facial droop, sore throat, chest pain, shortness of breath, abdominal pain, nausea/vomiting/diarrhea, dysuria, or weakness/numbness/paresthesias in any extremity   Physical Exam  Triage Vital Signs: ED Triage Vitals  Encounter Vitals Group     BP 06/02/23 0904 (!) 133/94     Systolic BP Percentile --      Diastolic BP Percentile --      Pulse Rate 06/02/23 0904 83     Resp 06/02/23 0904 18     Temp 06/02/23 0904 98 F (36.7 C)     Temp src --      SpO2 06/02/23 0904 100 %     Weight --      Height --      Head Circumference --      Peak Flow --      Pain Score 06/02/23 0902 0     Pain Loc --      Pain Education --      Exclude from Growth Chart --    Most recent vital signs: Vitals:   06/02/23 0904  BP: (!) 133/94  Pulse: 83  Resp: 18  Temp: 98 F (36.7 C)  SpO2: 100%   General: Awake, oriented x4. CV:  Good peripheral perfusion.  Resp:  Normal effort.  Abd:  No distention.  Other:  Middle-aged well-developed, well-nourished African-American male resting comfortably in no acute distress ED Results / Procedures / Treatments  Labs (all labs ordered are listed, but only abnormal results are displayed) Labs Reviewed  COMPREHENSIVE METABOLIC  PANEL - Abnormal; Notable for the following components:      Result Value   Calcium 8.7 (*)    Total Protein 6.2 (*)    Albumin 3.3 (*)    Alkaline Phosphatase 36 (*)    All other components within normal limits  SALICYLATE LEVEL - Abnormal; Notable for the following components:   Salicylate Lvl <7.0 (*)    All other components within normal limits  ACETAMINOPHEN LEVEL - Abnormal; Notable for the following components:   Acetaminophen (Tylenol), Serum <10 (*)    All other components within normal limits  ETHANOL  CBC  URINE DRUG SCREEN, QUALITATIVE ([facility] ONLY)   PROCEDURES: Critical Care performed: No Procedures MEDICATIONS ORDERED IN ED: Medications  hydrOXYzine (ATARAX) tablet 25 mg (has no administration in time range)  OLANZapine (ZYPREXA) tablet 5 mg (5 mg Oral Given 06/02/23 0928)   IMPRESSION / MDM / ASSESSMENT AND PLAN / ED COURSE  I reviewed the triage vital signs and the nursing notes.                             The patient is on the cardiac monitor to evaluate for evidence of arrhythmia and/or significant heart rate changes. Patient's presentation is most consistent with  acute presentation with potential threat to life or bodily function. Patient presents under for hallucinations/delusions. Thoughts are disorganized. No history of prior suicide attempt, and no SI or HI at this time. Clinically w/ no overt toxidrome, low suspicion for ingestion given hx and exam Thoughts unlikely 2/2 anemia, hypothyroidism, infection, or ICH. Patient's decision making capacity is compromised and they are unable to perform all ADL's (additionally they are without appropriate caretakers to assist through this deficit).  Consult: Psychiatry to evaluate patient for grave disability Disposition: Patient seen and evaluated by both teams and found to be stable for discharge at this time.  Patient was given refills on his prescribed psychoactive medications and given resources for follow-up  psychiatric care   FINAL CLINICAL IMPRESSION(S) / ED DIAGNOSES   Final diagnoses:  Auditory hallucinations   Rx / DC Orders   ED Discharge Orders          Ordered    hydrOXYzine (ATARAX) 50 MG tablet  3 times daily PRN,   Status:  Discontinued        06/02/23 0926    sertraline (ZOLOFT) 50 MG tablet  Daily,   Status:  Discontinued        06/02/23 0926    traZODone (DESYREL) 100 MG tablet  Daily at bedtime,   Status:  Discontinued        06/02/23 0926    OLANZapine (ZYPREXA) 5 MG tablet  Daily at bedtime        06/02/23 0926    hydrOXYzine (ATARAX) 50 MG tablet  3 times daily PRN        06/02/23 0927    sertraline (ZOLOFT) 50 MG tablet  Daily        06/02/23 0927    traZODone (DESYREL) 100 MG tablet  Daily at bedtime        06/02/23 1517           Note:  This document was prepared using Dragon voice recognition software and may include unintentional dictation errors.   Merwyn Katos, MD 06/02/23 [phone]

## 2023-06-02 NOTE — ED Notes (Signed)
Pt not dressed out. Labs collected. Charge RN aware and pt to be brought to ED 1

## 2023-06-02 NOTE — BH Assessment (Signed)
Comprehensive Clinical Assessment (CCA) Screening, Triage and Referral Note  06/02/2023 Torrion Goulder [id number]  Chief Complaint:  Chief Complaint  Patient presents with   Psychiatric Evaluation   Visit Diagnosis: Mental Health Evaluation for Court System.  Melih Hardeman is a 37 year old male who presents to the ER seeking assistance with obtaining a psychiatric evaluation for his probation officer and Ambulance person. Patient states he will need it to remain in the program, and attend "CBI classes." Patient reports, they are asking that he have it, to make sure he is mentally stable to attend the classes. Patient and girlfriend further report, he has tried several outpatient providers but he was either told they don't accept his insurance or he was told to come back another day, because their walk-in clinic didn't have the staff available to see him. When he attempted to call and make an appointment, he was instructed to walk in again, or he will need a referral or he would go to voicemail. He made several calls while TTS was in the room, and TTS witnessed him going to voice-mail or told to walk in.   Patient also shared when he was taking his medications, he felt better and was doing well. He shared he is doing good now but not as well as he was when he was taking his medications. Throughout the interview, the patient was calm, cooperative and pleasant. He was able to provide appropriate answers to the questions.  Throughout the interview, the patient denied SI/HI and AV/H  While TTS was in the room, he attempted to contact his probation officer and [facility] officer for further information about what he needed, but was forwarded to voice-mail.   TTS provide him with [facility] information to follow up with.  TTS also education the patient of the importance of remaining on his medications and following up with outpatient provider, prior to running out of medications.  Patient Reported  Information How did you hear about [place]? Self  What Is the Reason for Your Visit/Call Today? Seeking a psychiatric evaluation for his probation and [facility] officer.  How Long Has This Been Causing You Problems? 1 wk - 1 month  What Do You Feel Would Help You the Most Today? Treatment for Depression or other mood problem  Have You Recently Had Any Thoughts About Hurting Yourself? No  Are You Planning to Commit Suicide/Harm Yourself At This time? No   Have you Recently Had Thoughts About Hurting Someone Karolee Ohs? No  Are You Planning to Harm Someone at This Time? No  Explanation: No data recorded  Have You Used Any Alcohol or Drugs in the Past 24 Hours? No  How Long Ago Did You Use Drugs or Alcohol? No data recorded What Did You Use and How Much? No data recorded  Do You Currently Have a Therapist/Psychiatrist? No  Name of Therapist/Psychiatrist: No data recorded  Have You Been Recently Discharged From Any Office Practice or Programs? No  Explanation of Discharge From Practice/Program: No data recorded   CCA Screening Triage Referral Assessment Type of Contact: Face-to-Face  Telemedicine Service Delivery:   Is this Initial or Reassessment?   Date Telepsych consult ordered in [facility]:    Time Telepsych consult ordered in [facility]:    Location of Assessment: [facility] ED  Provider Location: [facility] ED   Collateral Involvement: No data recorded  Does Patient Have a Court Appointed Legal Guardian? No data recorded Name and Contact of Legal Guardian: No data recorded If Minor and Not Living  with Parent(s), Who has Custody? No data recorded Is CPS involved or ever been involved? Never  Is APS involved or ever been involved? Never  Patient Determined To Be At Risk for Harm To Self or Others Based on Review of Patient Reported Information or Presenting Complaint? No  Method: No data recorded Availability of Means: No data recorded Intent: No data recorded Notification Required: No data  recorded Additional Information for Danger to Others Potential: No data recorded Additional Comments for Danger to Others Potential: No data recorded Are There Guns or Other Weapons in Your Home? No  Types of Guns/Weapons: No data recorded Are These Weapons Safely Secured?                            No  Who Could Verify You Are Able To Have These Secured: No data recorded Do You Have any Outstanding Charges, Pending Court Dates, Parole/Probation? No data recorded Contacted To Inform of Risk of Harm To Self or Others: No data recorded  Does Patient Present under Involuntary Commitment? No  County of Residence: [place]  Patient Currently Receiving the Following Services: Not Receiving Services  Determination of Need: Emergent (2 hours)  Options For Referral: ED Visit  Discharge Disposition:    Lilyan Gilford MS, LCAS, [facility], [facility] Therapeutic Triage Specialist 06/02/2023 5:33 PM

## 2023-06-02 NOTE — ED Triage Notes (Addendum)
Pt comes with c/o needing psych evaluation. Pt states he is probation officer told him to get a evaluation. Pt states hx of multiple personalities but not currently on medications. Pt states he just needs to get back on his meds. Pt states he has been hearing voices and having hallucinations. Pt states some days worse than others. Pt denies any SI or HI.  Pt states daily alcohol use.pt denies any drug use.   Pt is calm and cooperative.

## 2023-06-02 NOTE — ED Notes (Addendum)
See triage note, pt reports was referred to come get psych eval and possibly restart medications from parole officer. Pt does not remember the medications he was previously taking. Denies SI/HI, denies complaints. Pt calm and cooperative, NAD  Given crackers and coke as requested

## 2023-06-05 ENCOUNTER — Telehealth (HOSPITAL_COMMUNITY): Payer: Self-pay | Admitting: Licensed Clinical Social Worker

## 2023-06-05 NOTE — Telephone Encounter (Signed)
The therapist receives the following email from Ms. Felipa Furnace with [facility]:  "Good afternoon Tyler [name] (DOB: 02/26/1986) reports he went to [facility] for a MH evaluation.  He reports that he was told that he needs a referral to complete a MH evaluation and treatment?  He says that he was provided with refill to his MH prescriptions but still needs a referral for the [facility] evaluation.  I am not very clear on what's going on with him.  Can you please advise on this?  When was his last contact and what happened? Hopefully, that comes up on his file if he did report.     Thank you     Unknown Foley, BA, CADC, CCJP  [facility] Care Manager/DTC Liaison  [facility]  [phone] [address]  [place], [place] [address]  Cell: [phone]  [email or web address]"  The therapist responds with the following, SECURE email:   "Good afternoon:  Tyler [name] did not go to [facility] for a MH evaluation. On 06/02/23, he went to [facility] ER for an evaluation for reasons that are not clear. One of the notes indicates that Tyler [name] is an [facility] resident; however, we have his address as [place] in our system.  Thus, if he is a [facility] resident; he needs to come to the 2nd floor of our [address] location for a walk-in assessment which can be done Monday through Friday starting at 8 a.m. but it would be best for him to arrive by 7:30 a.m.   Thank you,"  Myrna Blazer, MA, LCSW, [facility], LCAS 06/05/2023

## 2023-06-20 ENCOUNTER — Telehealth (HOSPITAL_COMMUNITY): Payer: Self-pay | Admitting: Licensed Clinical Social Worker

## 2023-06-20 NOTE — Telephone Encounter (Signed)
The therapist receives a ROI for this patient from Unknown Foley, BA, CADC, CCJP with [facility] and responds with the following, secure email:   "Tyler [name] has had no additional contact with [place] since he was seen at the [facility] ER for an assessment on 06/02/23."     Myrna Blazer, MA, LCSW, [facility], LCAS 06/20/2023

## 2023-07-04 ENCOUNTER — Telehealth (HOSPITAL_COMMUNITY): Payer: Self-pay | Admitting: Licensed Clinical Social Worker

## 2023-07-04 NOTE — Telephone Encounter (Signed)
The therapist receives the following email from Ms. Felipa Furnace with [facility] along with a ROI for this patient:  "Good morning Bill,  Can I get an update on Tyler [name], DOB: Feb 23, 1986?  Last week, I told him that if he did not report by the end of the week, I will be unsuccessfully discharging him.  He no showed for me yesterday.  Thank you!  Unknown Foley, BA, CADC, CCJP  [facility] Care Manager/DTC Liaison [facility]  [phone] [address] [place], [place] [address] Cell: [phone] [email or web address]"  The therapist responds via a secure email with the following:  "Good morning,  At this point in time, we have still had no contact with [name] nor does he have any future events scheduled with [place] in the system.  Regards,"  Myrna Blazer, MA, LCSW, [facility], LCAS 07/04/2023

## 2023-07-26 ENCOUNTER — Ambulatory Visit (INDEPENDENT_AMBULATORY_CARE_PROVIDER_SITE_OTHER): Payer: 59 | Admitting: Cardiology

## 2023-07-26 ENCOUNTER — Encounter: Payer: Self-pay | Admitting: Cardiology

## 2023-07-26 VITALS — BP 120/89 | HR 81 | Ht 72.0 in | Wt 212.8 lb

## 2023-07-26 DIAGNOSIS — F17210 Nicotine dependence, cigarettes, uncomplicated: Secondary | ICD-10-CM

## 2023-07-26 DIAGNOSIS — Z131 Encounter for screening for diabetes mellitus: Secondary | ICD-10-CM | POA: Diagnosis not present

## 2023-07-26 DIAGNOSIS — M25562 Pain in left knee: Secondary | ICD-10-CM | POA: Diagnosis not present

## 2023-07-26 DIAGNOSIS — F333 Major depressive disorder, recurrent, severe with psychotic symptoms: Secondary | ICD-10-CM

## 2023-07-26 DIAGNOSIS — Z13220 Encounter for screening for lipoid disorders: Secondary | ICD-10-CM

## 2023-07-26 DIAGNOSIS — F419 Anxiety disorder, unspecified: Secondary | ICD-10-CM | POA: Diagnosis not present

## 2023-07-26 DIAGNOSIS — Z1329 Encounter for screening for other suspected endocrine disorder: Secondary | ICD-10-CM | POA: Diagnosis not present

## 2023-07-26 DIAGNOSIS — F4325 Adjustment disorder with mixed disturbance of emotions and conduct: Secondary | ICD-10-CM

## 2023-07-26 DIAGNOSIS — F609 Personality disorder, unspecified: Secondary | ICD-10-CM

## 2023-07-26 MED ORDER — NICOTINE 21 MG/24HR TD PT24: 21.0000 mg | MEDICATED_PATCH | TRANSDERMAL | 0 refills | Status: AC

## 2023-07-26 MED ORDER — NICOTINE 7 MG/24HR TD PT24: 7.0000 mg | MEDICATED_PATCH | TRANSDERMAL | 0 refills | Status: AC

## 2023-07-26 MED ORDER — NICOTINE 14 MG/24HR TD PT24: 14.0000 mg | MEDICATED_PATCH | TRANSDERMAL | 0 refills | Status: AC

## 2023-07-26 MED ORDER — ALBUTEROL SULFATE HFA 108 (90 BASE) MCG/ACT IN AERS: 2.0000 | INHALATION_SPRAY | Freq: Four times a day (QID) | RESPIRATORY_TRACT | 2 refills | Status: DC | PRN

## 2023-07-26 NOTE — Progress Notes (Signed)
New Patient Office Visit  Subjective    Patient ID: Tyler [name], male    DOB: [birth year]  Age: 37 y.o. MRN: [id number]  CC:  Chief Complaint  Patient presents with   Establish Care    NPE    HPI Tyler [name] presents to establish care. Patient complaining of left knee pain. Will order an xray, referral pending xray results.  Patient has a history of mental health disorder, will refer to psychiatry.  Patient smokes, wanting to quit. Will send in nicotine patches. Patient complains of shortness of breath, needs a refill on albuterol.  Will order fasting lab work today.    Outpatient Encounter Medications as of 07/26/2023  Medication Sig   albuterol (VENTOLIN HFA) 108 (90 Base) MCG/ACT inhaler Inhale 2 puffs into the lungs every 6 (six) hours as needed for wheezing or shortness of breath.   nicotine (NICODERM CQ - DOSED IN MG/24 HOURS) 14 mg/24hr patch Place 1 patch (14 mg total) onto the skin daily.   nicotine (NICODERM CQ - DOSED IN MG/24 HOURS) 21 mg/24hr patch Place 1 patch (21 mg total) onto the skin daily.   nicotine (NICODERM CQ - DOSED IN MG/24 HR) 7 mg/24hr patch Place 1 patch (7 mg total) onto the skin daily.   cephALEXin (KEFLEX) 500 MG capsule Take 1 capsule (500 mg total) by mouth 2 (two) times daily.   hydrOXYzine (ATARAX) 50 MG tablet Take 1 tablet (50 mg total) by mouth 3 (three) times daily as needed for anxiety.   ibuprofen (ADVIL) 800 MG tablet Take 1 tablet (800 mg total) by mouth every 8 (eight) hours as needed.   meloxicam (MOBIC) 7.5 MG tablet Take 1 tablet (7.5 mg total) by mouth daily.   nicotine polacrilex (NICORETTE) 2 MG gum Take 1 each (2 mg total) by mouth as needed for smoking cessation.   OLANZapine (ZYPREXA) 5 MG tablet Take 1 tablet (5 mg total) by mouth at bedtime.   ondansetron (ZOFRAN-ODT) 4 MG disintegrating tablet Take 1 tablet (4 mg total) by mouth every 6 (six) hours as needed for nausea or vomiting.   sertraline (ZOLOFT) 50 MG tablet Take 1  tablet (50 mg total) by mouth daily.   traZODone (DESYREL) 100 MG tablet Take 1 tablet (100 mg total) by mouth at bedtime.   Vitamin D, Ergocalciferol, (DRISDOL) 1.25 MG (50000 UNIT) CAPS capsule Take 1 capsule (50,000 Units total) by mouth every 7 (seven) days.   No facility-administered encounter medications on file as of 07/26/2023.    Past Medical History:  Diagnosis Date   Anxiety    panic and SOB    Past Surgical History:  Procedure Laterality Date   IRRIGATION AND DEBRIDEMENT ABSCESS N/A 08/04/2020   Procedure: IRRIGATION AND DEBRIDEMENT PERINEAL ABSCESS, Lovett Sox;  Surgeon: Bjorn Pippin, MD;  Location: [facility];  Service: Urology;  Laterality: N/A;   IRRIGATION AND DEBRIDEMENT ABSCESS N/A 10/20/2020   Procedure: IRRIGATION AND DEBRIDEMENT PERINEAL  ABSCESS;  Surgeon: Sebastian Ache, MD;  [facility]: [facility];  Service: Urology;  Laterality: N/A;   IRRIGATION AND DEBRIDEMENT BUTTOCKS N/A 10/28/2020   Procedure: IRRIGATION AND DEBRIDEMENT PERINEAL ABCESS;  Surgeon: Abigail Miyamoto, MD;  Location: [facility];  Service: General;  Laterality: N/A;  packing 2" gauze   TRANSURETHRAL RESECTION OF PROSTATE  07/21/2020   Procedure: TRANSURETHRAL RESECTION OF THE PROSTATE (TURP);  Surgeon: Rene Paci, MD;  [facility]: [facility];  Service: Urology;;   TRANSURETHRAL RESECTION OF PROSTATE N/A 10/20/2020   Procedure: CYSTOSCOPY,  TRANSURETHRAL RESECTION OF THE PROSTATE ABCESS(TURP);  Surgeon: Sebastian Ache, MD;  [facility];  Service: Urology;  Laterality: N/A;    History reviewed. No pertinent family history.  Social History   Socioeconomic History   Marital status: Single    Spouse name: Not on file   Number of children: Not on file   Years of education: Not on file   Highest education level: Not on file  Occupational History   Not on file  Tobacco Use   Smoking status: Every Day    Current packs/day: 1.00    Types: Cigarettes   Smokeless tobacco: Never  Vaping  Use   Vaping status: Never Used  Substance and Sexual Activity   Alcohol use: Yes   Drug use: Yes    Types: Marijuana   Sexual activity: Not Currently  Other Topics Concern   Not on file  Social History Narrative   Not on file   Social Determinants of Health   Financial Resource Strain: Low Risk  (07/02/2020)   Received from [facility], [facility]   Overall Financial Resource Strain (CARDIA)    Difficulty of Paying Living Expenses: Not hard at all  Food Insecurity: Not on file  Transportation Needs: Not on file  Physical Activity: Not on file  Stress: No Stress Concern Present (07/02/2020)   Received from [facility], [facility]   [employer] of Occupational Health - Occupational Stress Questionnaire    Feeling of Stress : Not at all  Social Connections: Unknown (03/01/2022)   Received from [facility], [facility]   Social Network    Social Network: Not on file  Intimate Partner Violence: Unknown (01/21/2022)   Received from [facility], [facility]   HITS    Physically Hurt: Not on file    Insult or Talk Down To: Not on file    Threaten Physical Harm: Not on file    Scream or Curse: Not on file    Review of Systems  Constitutional: Negative.   HENT: Negative.    Eyes: Negative.   Respiratory:  Positive for shortness of breath.   Cardiovascular: Negative.  Negative for chest pain.  Gastrointestinal: Negative.  Negative for abdominal pain, constipation and diarrhea.  Genitourinary: Negative.   Musculoskeletal:  Positive for joint pain. Negative for myalgias.  Skin: Negative.   Neurological: Negative.  Negative for dizziness and headaches.  Endo/Heme/Allergies: Negative.   All other systems reviewed and are negative.       Objective    BP 120/89   Pulse 81   Ht 6' (1.829 m)   Wt 212 lb 12.8 oz (96.5 kg)   SpO2 98%   BMI 28.86 kg/m   Physical Exam Nursing note reviewed.  Constitutional:      Appearance: Normal appearance. He is  normal weight.  HENT:     Head: Normocephalic and atraumatic.     Nose: Nose normal.     Mouth/Throat:     Mouth: Mucous membranes are moist.     Pharynx: Oropharynx is clear.  Eyes:     Extraocular Movements: Extraocular movements intact.     Conjunctiva/sclera: Conjunctivae normal.     Pupils: Pupils are equal, round, and reactive to light.  Cardiovascular:     Rate and Rhythm: Normal rate and regular rhythm.     Pulses: Normal pulses.     Heart sounds: Normal heart sounds.  Pulmonary:     Effort: Pulmonary effort is normal.     Breath  sounds: Normal breath sounds.  Abdominal:     General: Abdomen is flat. Bowel sounds are normal.     Palpations: Abdomen is soft.  Musculoskeletal:        General: Normal range of motion.     Cervical back: Normal range of motion.  Skin:    General: Skin is warm and dry.  Neurological:     General: No focal deficit present.     Mental Status: He is alert and oriented to person, place, and time.  Psychiatric:        Mood and Affect: Mood normal.        Behavior: Behavior normal.        Thought Content: Thought content normal.        Judgment: Judgment normal.       Assessment & Plan:  Left knee xray Albuterol refilled Nicotine patches to assist with smoking cessation Referral to psychiatry Fasting lab work today  Problem List Items Addressed This Visit   None Visit Diagnoses     Lipid screening    -  Primary   Relevant Orders   Lipid Profile   Thyroid disorder screening       Relevant Orders   TSH   Diabetes mellitus screening       Relevant Orders   Hemoglobin A1c   CMP14+EGFR   Acute pain of left knee       Relevant Orders   DG Knee 1-2 Views Left       Return in about 4 weeks (around 08/23/2023).   Marisue Ivan, NP

## 2023-07-27 LAB — LIPID PANEL
Chol/HDL Ratio: 3.7 {ratio} (ref 0.0–5.0)
Cholesterol, Total: 214 mg/dL — ABNORMAL HIGH (ref 100–199)
HDL: 58 mg/dL (ref >39)
LDL Chol Calc (NIH): 146 mg/dL — ABNORMAL HIGH (ref 0–99)
Triglycerides: 55 mg/dL (ref 0–149)
VLDL Cholesterol Cal: 10 mg/dL (ref 5–40)

## 2023-07-27 LAB — CMP14+EGFR
ALT: 28 [IU]/L (ref 0–44)
AST: 30 [IU]/L (ref 0–40)
Albumin: 3.9 g/dL — ABNORMAL LOW (ref 4.1–5.1)
Alkaline Phosphatase: 45 [IU]/L (ref 44–121)
BUN/Creatinine Ratio: 7 — ABNORMAL LOW (ref 9–20)
BUN: 8 mg/dL (ref 6–20)
Bilirubin Total: 0.3 mg/dL (ref 0.0–1.2)
CO2: 23 mmol/L (ref 20–29)
Calcium: 9.5 mg/dL (ref 8.7–10.2)
Chloride: 103 mmol/L (ref 96–106)
Creatinine, Ser: 1.19 mg/dL (ref 0.76–1.27)
Globulin, Total: 2.2 g/dL (ref 1.5–4.5)
Glucose: 83 mg/dL (ref 70–99)
Potassium: 4.6 mmol/L (ref 3.5–5.2)
Sodium: 139 mmol/L (ref 134–144)
Total Protein: 6.1 g/dL (ref 6.0–8.5)
eGFR: 82 mL/min/{1.73_m2} (ref >59)

## 2023-07-27 LAB — TSH: TSH: 1.21 u[IU]/mL (ref 0.450–4.500)

## 2023-07-27 LAB — HEMOGLOBIN A1C
Est. average glucose Bld gHb Est-mCnc: 111 mg/dL
Hgb A1c MFr Bld: 5.5 % (ref 4.8–5.6)

## 2023-08-25 ENCOUNTER — Ambulatory Visit (INDEPENDENT_AMBULATORY_CARE_PROVIDER_SITE_OTHER): Payer: 59 | Admitting: Cardiology

## 2023-08-25 ENCOUNTER — Encounter: Payer: Self-pay | Admitting: Cardiology

## 2023-08-25 VITALS — BP 119/80 | HR 85 | Ht 72.0 in | Wt 216.4 lb

## 2023-08-25 DIAGNOSIS — M25562 Pain in left knee: Secondary | ICD-10-CM | POA: Insufficient documentation

## 2023-08-25 DIAGNOSIS — R0781 Pleurodynia: Secondary | ICD-10-CM | POA: Diagnosis not present

## 2023-08-25 DIAGNOSIS — Z23 Encounter for immunization: Secondary | ICD-10-CM

## 2023-08-25 MED ORDER — IBUPROFEN 800 MG PO TABS: 800.0000 mg | ORAL_TABLET | Freq: Three times a day (TID) | ORAL | 0 refills | Status: DC | PRN

## 2023-08-25 MED ORDER — MELOXICAM 7.5 MG PO TABS: 7.5000 mg | ORAL_TABLET | Freq: Every day | ORAL | 1 refills | Status: DC

## 2023-08-25 NOTE — Progress Notes (Signed)
Established Patient Office Visit  Subjective:  Patient ID: Tyler [name], male    DOB: October 20, 1985  Age: 37 y.o. MRN: [id number]  Chief Complaint  Patient presents with   Follow-up    Pain in ribs left side Start yesterday Hurts to take deep breaths to move    Patient in office for one month follow up. Patient continues to have left knee pain, has not had xray done as ordered. States he will go today.  Patient reports he has quit smoking, still using nicotine patches.  Patient complaining of left rib pain today. States pain started yesterday. Pain constant. Has not taken any OTC medications. States he felt his heart rate was elevated yesterday.  EKG today normal, will send for xray of ribs.     No other concerns at this time.   Past Medical History:  Diagnosis Date   Anxiety    panic and SOB    Past Surgical History:  Procedure Laterality Date   IRRIGATION AND DEBRIDEMENT ABSCESS N/A 08/04/2020   Procedure: IRRIGATION AND DEBRIDEMENT PERINEAL ABSCESS, Lovett Sox;  Surgeon: Bjorn Pippin, MD;  [facility];  Service: Urology;  Laterality: N/A;   IRRIGATION AND DEBRIDEMENT ABSCESS N/A 10/20/2020   Procedure: IRRIGATION AND DEBRIDEMENT PERINEAL  ABSCESS;  Surgeon: Sebastian Ache, MD;  [facility];  Service: Urology;  Laterality: N/A;   IRRIGATION AND DEBRIDEMENT BUTTOCKS N/A 10/28/2020   Procedure: IRRIGATION AND DEBRIDEMENT PERINEAL ABCESS;  Surgeon: Abigail Miyamoto, MD;  [facility];  Service: General;  Laterality: N/A;  packing 2" gauze   TRANSURETHRAL RESECTION OF PROSTATE  07/21/2020   Procedure: TRANSURETHRAL RESECTION OF THE PROSTATE (TURP);  Surgeon: Rene Paci, MD;  [facility];  Service: Urology;;   TRANSURETHRAL RESECTION OF PROSTATE N/A 10/20/2020   Procedure: CYSTOSCOPY, TRANSURETHRAL RESECTION OF THE PROSTATE ABCESS(TURP);  Surgeon: Sebastian Ache, MD;  [facility];  Service: Urology;  Laterality: N/A;    Social  History   Socioeconomic History   Marital status: Single    Spouse name: Not on file   Number of children: Not on file   Years of education: Not on file   Highest education level: Not on file  Occupational History   Not on file  Tobacco Use   Smoking status: Former    Current packs/day: 1.00    Types: Cigarettes   Smokeless tobacco: Never  Vaping Use   Vaping status: Never Used  Substance and Sexual Activity   Alcohol use: Yes   Drug use: Yes    Types: Marijuana   Sexual activity: Not Currently  Other Topics Concern   Not on file  Social History Narrative   Not on file   Social Determinants of Health   Financial Resource Strain: Low Risk  (07/02/2020)   Received from [facility], [facility]   Overall Financial Resource Strain (CARDIA)    Difficulty of Paying Living Expenses: Not hard at all  Food Insecurity: Not on file  Transportation Needs: Not on file  Physical Activity: Not on file  Stress: No Stress Concern Present (07/02/2020)   Received from [facility], [facility]   [employer] of Occupational Health - Occupational Stress Questionnaire    Feeling of Stress : Not at all  Social Connections: Unknown (03/01/2022)   Received from [facility], [facility]   Social Network    Social Network: Not on file  Intimate Partner Violence: Unknown (01/21/2022)   Received from [facility], [facility]  HITS    Physically Hurt: Not on file    Insult or Talk Down To: Not on file    Threaten Physical Harm: Not on file    Scream or Curse: Not on file    History reviewed. No pertinent family history.  No Known Allergies  Review of Systems  Constitutional: Negative.   HENT: Negative.    Eyes: Negative.   Respiratory: Negative.  Negative for shortness of breath.   Cardiovascular: Negative.  Negative for chest pain.  Gastrointestinal: Negative.  Negative for abdominal pain, constipation and diarrhea.  Genitourinary:  Positive for flank pain.   Musculoskeletal:  Negative for joint pain and myalgias.       Left rib pain  Skin: Negative.   Neurological: Negative.  Negative for dizziness and headaches.  Endo/Heme/Allergies: Negative.   All other systems reviewed and are negative.      Objective:   BP 119/80   Pulse 85   Ht 6' (1.829 m)   Wt 216 lb 6.4 oz (98.2 kg)   SpO2 97%   BMI 29.35 kg/m   Vitals:   08/25/23 1125  BP: 119/80  Pulse: 85  Height: 6' (1.829 m)  Weight: 216 lb 6.4 oz (98.2 kg)  SpO2: 97%  BMI (Calculated): 29.34    Physical Exam Nursing note reviewed.  Constitutional:      Appearance: Normal appearance. He is normal weight.  HENT:     Head: Normocephalic and atraumatic.     Nose: Nose normal.     Mouth/Throat:     Mouth: Mucous membranes are moist.     Pharynx: Oropharynx is clear.  Eyes:     Extraocular Movements: Extraocular movements intact.     Conjunctiva/sclera: Conjunctivae normal.     Pupils: Pupils are equal, round, and reactive to light.  Cardiovascular:     Rate and Rhythm: Normal rate and regular rhythm.     Pulses: Normal pulses.     Heart sounds: Normal heart sounds.  Pulmonary:     Effort: Pulmonary effort is normal.     Breath sounds: Normal breath sounds.  Abdominal:     General: Abdomen is flat. Bowel sounds are normal.     Palpations: Abdomen is soft.  Musculoskeletal:        General: Normal range of motion.     Cervical back: Normal range of motion.  Skin:    General: Skin is warm and dry.  Neurological:     General: No focal deficit present.     Mental Status: He is alert and oriented to person, place, and time.  Psychiatric:        Mood and Affect: Mood normal.        Behavior: Behavior normal.        Thought Content: Thought content normal.        Judgment: Judgment normal.      No results found for any visits on 08/25/23.  Recent Results (from the past 2160 hour(s))  Comprehensive metabolic panel     Status: Abnormal   Collection Time:  06/02/23  9:04 AM  Result Value Ref Range   Sodium 138 135 - 145 mmol/L   Potassium 4.4 3.5 - 5.1 mmol/L   Chloride 107 98 - 111 mmol/L   CO2 23 22 - 32 mmol/L   Glucose, Bld 92 70 - 99 mg/dL    Comment: Glucose reference range applies only to samples taken after fasting for at least 8 hours.   BUN 11  6 - 20 mg/dL   Creatinine, Ser 7.82 0.61 - 1.24 mg/dL   Calcium 8.7 (L) 8.9 - 10.3 mg/dL   Total Protein 6.2 (L) 6.5 - 8.1 g/dL   Albumin 3.3 (L) 3.5 - 5.0 g/dL   AST 34 15 - 41 U/L   ALT 24 0 - 44 U/L   Alkaline Phosphatase 36 (L) 38 - 126 U/L   Total Bilirubin 0.5 0.3 - 1.2 mg/dL   GFR, Estimated >95 >62 mL/min    Comment: (NOTE) Calculated using the CKD-EPI Creatinine Equation (2021)    Anion gap 8 5 - 15    Comment: Performed at [facility], [address], [place], [place] [address]  Ethanol     Status: None   Collection Time: 06/02/23  9:04 AM  Result Value Ref Range   Alcohol, Ethyl (B) <10 <10 mg/dL    Comment: (NOTE) Lowest detectable limit for serum alcohol is 10 mg/dL.  For medical purposes only. Performed at [facility], [address] [place], [place] [address]   Salicylate level     Status: Abnormal   Collection Time: 06/02/23  9:04 AM  Result Value Ref Range   Salicylate Lvl <7.0 (L) 7.0 - 30.0 mg/dL    Comment: Performed at [facility], [address] [place], [place] [address]  Acetaminophen level     Status: Abnormal   Collection Time: 06/02/23  9:04 AM  Result Value Ref Range   Acetaminophen (Tylenol), Serum <10 (L) 10 - 30 ug/mL    Comment: (NOTE) Therapeutic concentrations vary significantly. A range of 10-30 ug/mL  may be an effective concentration for many patients. However, some  are best treated at concentrations outside of this range. Acetaminophen concentrations >150 ug/mL at 4 hours after ingestion  and >50 ug/mL at 12 hours after ingestion are often associated with  toxic reactions.  Performed at [facility], [address] [place], [place] [address]   cbc     Status: None   Collection Time: 06/02/23  9:04 AM  Result Value Ref Range   WBC 5.9 4.0 - 10.5 K/uL   RBC 5.03 4.22 - 5.81 MIL/uL   Hemoglobin 14.1 13.0 - 17.0 g/dL   HCT 24.4 01.0 - 27.2 %   MCV 83.7 80.0 - 100.0 fL   MCH 28.0 26.0 - 34.0 pg   MCHC 33.5 30.0 - 36.0 g/dL   RDW 53.6 64.4 - 03.4 %   Platelets 175 150 - 400 K/uL   nRBC 0.0 0.0 - 0.2 %    Comment: Performed at [facility], [address] [place], [place] [address]  Lipid Profile     Status: Abnormal   Collection Time: 07/26/23 12:07 PM  Result Value Ref Range   Cholesterol, Total 214 (H) 100 - 199 mg/dL   Triglycerides 55 0 - 149 mg/dL   HDL 58 >56 mg/dL   VLDL Cholesterol Cal 10 5 - 40 mg/dL   LDL Chol Calc (NIH) 387 (H) 0 - 99 mg/dL   Chol/HDL Ratio 3.7 0.0 - 5.0 ratio    Comment:                                   T. Chol/HDL Ratio  Men  Women                               1/2 Avg.Risk  3.4    3.3                                   Avg.Risk  5.0    4.4                                2X Avg.Risk  9.6    7.1                                3X Avg.Risk 23.4   11.0   Hemoglobin A1c     Status: None   Collection Time: 07/26/23 12:07 PM  Result Value Ref Range   Hgb A1c MFr Bld 5.5 4.8 - 5.6 %    Comment:          Prediabetes: 5.7 - 6.4          Diabetes: >6.4          Glycemic control for adults with diabetes: <7.0    Est. average glucose Bld gHb Est-mCnc 111 mg/dL  TSH     Status: None   Collection Time: 07/26/23 12:07 PM  Result Value Ref Range   TSH 1.210 0.450 - 4.500 uIU/mL  CMP14+EGFR     Status: Abnormal   Collection Time: 07/26/23 12:07 PM  Result Value Ref Range   Glucose 83 70 - 99 mg/dL   BUN 8 6 - 20 mg/dL   Creatinine, Ser 9.32 0.76 - 1.27 mg/dL   eGFR 82 >35 TD/DUK/0.25   BUN/Creatinine Ratio 7 (L) 9 - 20   Sodium 139 134 - 144 mmol/L   Potassium 4.6 3.5 - 5.2 mmol/L    Chloride 103 96 - 106 mmol/L   CO2 23 20 - 29 mmol/L   Calcium 9.5 8.7 - 10.2 mg/dL   Total Protein 6.1 6.0 - 8.5 g/dL   Albumin 3.9 (L) 4.1 - 5.1 g/dL   Globulin, Total 2.2 1.5 - 4.5 g/dL   Bilirubin Total 0.3 0.0 - 1.2 mg/dL   Alkaline Phosphatase 45 44 - 121 IU/L   AST 30 0 - 40 IU/L   ALT 28 0 - 44 IU/L      Assessment & Plan:  EKG today Chest xray Knee xray  Problem List Items Addressed This Visit       Other   Rib pain on left side - Primary   Relevant Orders   DG Chest 2 View   Acute pain of left knee    Return in about 4 weeks (around 09/22/2023).   Total time spent: 25 minutes  [employer], NP  08/25/2023   This document may have been prepared by Dragon Voice Recognition software and as such may include unintentional dictation errors.

## 2023-08-28 ENCOUNTER — Encounter: Payer: Self-pay | Admitting: Cardiology

## 2023-08-29 ENCOUNTER — Ambulatory Visit
Admission: RE | Admit: 2023-08-29 | Discharge: 2023-08-29 | Disposition: A | Discharge status: Home / Self Care | Payer: 59 | Source: Ambulatory Visit | Attending: Cardiology | Admitting: Cardiology

## 2023-08-29 DIAGNOSIS — M25562 Pain in left knee: Secondary | ICD-10-CM | POA: Diagnosis not present

## 2023-08-29 DIAGNOSIS — R0789 Other chest pain: Secondary | ICD-10-CM | POA: Diagnosis not present

## 2023-08-31 ENCOUNTER — Other Ambulatory Visit: Payer: Self-pay | Admitting: Cardiology

## 2023-08-31 ENCOUNTER — Telehealth: Payer: Self-pay

## 2023-08-31 MED ORDER — DOXYCYCLINE HYCLATE 100 MG PO TABS: 100.0000 mg | ORAL_TABLET | Freq: Two times a day (BID) | ORAL | 0 refills | Status: AC

## 2023-08-31 NOTE — Telephone Encounter (Signed)
Patients fiancee' called and his meds have not been sent to [facility] [address] in [place]. Saw Amber yesterday and had xray. Pneumonia.

## 2023-09-12 ENCOUNTER — Telehealth (HOSPITAL_COMMUNITY): Payer: Self-pay

## 2023-09-12 NOTE — Telephone Encounter (Signed)
This therapist receives an email with a ROI from Lurline Del, CDAD requesting to know of patients engagement.  This therapist sends the following secure email: "Tyler [name] April 15, 1986:  No contract since the last request from [facility] for an update on 07-04-23"  Remigio Eisenmenger, MS, LMFT, LCAS  09/12/23

## 2023-09-18 ENCOUNTER — Ambulatory Visit (HOSPITAL_COMMUNITY)
Admission: EM | Admit: 2023-09-18 | Discharge: 2023-09-18 | Disposition: A | Discharge status: Home / Self Care | Payer: 59

## 2023-09-18 ENCOUNTER — Encounter (HOSPITAL_COMMUNITY): Payer: Self-pay | Admitting: Licensed Clinical Social Worker

## 2023-09-18 ENCOUNTER — Ambulatory Visit (INDEPENDENT_AMBULATORY_CARE_PROVIDER_SITE_OTHER): Payer: 59 | Admitting: Licensed Clinical Social Worker

## 2023-09-18 DIAGNOSIS — F4325 Adjustment disorder with mixed disturbance of emotions and conduct: Secondary | ICD-10-CM

## 2023-09-18 NOTE — ED Provider Notes (Signed)
Patient presents requesting psychiatric evaluation as requested by his probation officer. Reports he needs CBI classes and will need cognitive behavorial therapy.    Explained to patient that psychiatric evaluations required by parole committee will need to be conducted in the out patient setting. Explained that there is one appointment available with [facility] this am. He declined medical screening exam at this time and agrees to present upstairs with [facility]. He will represent if unable to obtain an appointment.
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
## 2023-09-22 ENCOUNTER — Ambulatory Visit: Payer: 59 | Admitting: Cardiology

## 2023-09-25 ENCOUNTER — Ambulatory Visit (INDEPENDENT_AMBULATORY_CARE_PROVIDER_SITE_OTHER): Payer: 59 | Admitting: Cardiology

## 2023-09-25 ENCOUNTER — Encounter: Payer: Self-pay | Admitting: Cardiology

## 2023-09-25 VITALS — BP 112/81 | HR 67 | Ht 72.0 in | Wt 223.9 lb

## 2023-09-25 DIAGNOSIS — M25562 Pain in left knee: Secondary | ICD-10-CM | POA: Diagnosis not present

## 2023-09-25 DIAGNOSIS — Z0130 Encounter for examination of blood pressure without abnormal findings: Secondary | ICD-10-CM

## 2023-09-25 MED ORDER — MELOXICAM 7.5 MG PO TABS: 7.5000 mg | ORAL_TABLET | Freq: Every day | ORAL | 1 refills | Status: DC

## 2023-09-25 NOTE — Progress Notes (Signed)
Established Patient Office Visit  Subjective:  Patient ID: Tyler [name], male    DOB: November 02, 1985  Age: 37 y.o. MRN: [id number]  Chief Complaint  Patient presents with   Follow-up    Patient in office for 4 week follow up. Rib pain pain gone. Continues to have knee pain, did not pick up mobic. Will resend to pharmacy. Will send referral to orthopaedics. Discussed recent lab work. LDL elevated. High cholesterol handout given to patient.    No other concerns at this time.   Past Medical History:  Diagnosis Date   Anxiety    panic and SOB    Past Surgical History:  Procedure Laterality Date   IRRIGATION AND DEBRIDEMENT ABSCESS N/A 08/04/2020   Procedure: IRRIGATION AND DEBRIDEMENT PERINEAL ABSCESS, Lovett Sox;  Surgeon: Bjorn Pippin, MD;  Location: [facility];  Service: Urology;  Laterality: N/A;   IRRIGATION AND DEBRIDEMENT ABSCESS N/A 10/20/2020   Procedure: IRRIGATION AND DEBRIDEMENT PERINEAL  ABSCESS;  Surgeon: Sebastian Ache, MD;  Location: [facility];  Service: Urology;  Laterality: N/A;   IRRIGATION AND DEBRIDEMENT BUTTOCKS N/A 10/28/2020   Procedure: IRRIGATION AND DEBRIDEMENT PERINEAL ABCESS;  Surgeon: Abigail Miyamoto, MD;  Location: [facility];  Service: General;  Laterality: N/A;  packing 2" gauze   TRANSURETHRAL RESECTION OF PROSTATE  07/21/2020   Procedure: TRANSURETHRAL RESECTION OF THE PROSTATE (TURP);  Surgeon: Rene Paci, MD;  Location: [facility];  Service: Urology;;   TRANSURETHRAL RESECTION OF PROSTATE N/A 10/20/2020   Procedure: CYSTOSCOPY, TRANSURETHRAL RESECTION OF THE PROSTATE ABCESS(TURP);  Surgeon: Sebastian Ache, MD;  Location: [facility];  Service: Urology;  Laterality: N/A;    Social History   Socioeconomic History   Marital status: Single    Spouse name: Not on file   Number of children: Not on file   Years of education: Not on file   Highest education level: Not on file  Occupational History   Not on file  Tobacco Use   Smoking status:  Former    Current packs/day: 1.00    Types: Cigarettes   Smokeless tobacco: Never  Vaping Use   Vaping status: Never Used  Substance and Sexual Activity   Alcohol use: Yes    Alcohol/week: 2.0 standard drinks of alcohol    Types: 2 Cans of beer per week   Drug use: Not Currently    Types: Marijuana   Sexual activity: Yes    Partners: Female    Birth control/protection: None  Other Topics Concern   Not on file  Social History Narrative   Not on file   Social Determinants of Health   Financial Resource Strain: High Risk (09/18/2023)   Overall Financial Resource Strain (CARDIA)    Difficulty of Paying Living Expenses: Very hard  Food Insecurity: Food Insecurity Present (09/18/2023)   Hunger Vital Sign    Worried About Running Out of Food in the Last Year: Sometimes true    Ran Out of Food in the Last Year: Sometimes true  Transportation Needs: Unmet Transportation Needs (09/18/2023)   PRAPARE - Transportation    Lack of Transportation (Medical): Yes    Lack of Transportation (Non-Medical): Yes  Physical Activity: Sufficiently Active (09/18/2023)   Exercise Vital Sign    Days of Exercise per Week: 3 days    Minutes of Exercise per Session: 60 min  Stress: No Stress Concern Present (09/18/2023)   [employer] of Occupational Health - Occupational Stress Questionnaire    Feeling of Stress : Only a little  Social Connections: Socially Isolated (09/18/2023)   Social Connection and Isolation Panel [NHANES]    Frequency of Communication with Friends and Family: Once a week    Frequency of Social Gatherings with Friends and Family: Once a week    Attends Religious Services: More than 4 times per year    Active Member of [employer] or Organizations: No    Attends Banker Meetings: Never    Marital Status: Separated  Intimate Partner Violence: Not At Risk (09/18/2023)   Humiliation, Afraid, Rape, and Kick questionnaire    Fear of Current or Ex-Partner: No    Emotionally  Abused: No    Physically Abused: No    Sexually Abused: No    History reviewed. No pertinent family history.  No Known Allergies  Outpatient Medications Prior to Visit  Medication Sig   albuterol (VENTOLIN HFA) 108 (90 Base) MCG/ACT inhaler Inhale 2 puffs into the lungs every 6 (six) hours as needed for wheezing or shortness of breath.   cephALEXin (KEFLEX) 500 MG capsule Take 1 capsule (500 mg total) by mouth 2 (two) times daily.   hydrOXYzine (ATARAX) 50 MG tablet Take 1 tablet (50 mg total) by mouth 3 (three) times daily as needed for anxiety.   ibuprofen (ADVIL) 800 MG tablet Take 1 tablet (800 mg total) by mouth every 8 (eight) hours as needed.   OLANZapine (ZYPREXA) 5 MG tablet Take 1 tablet (5 mg total) by mouth at bedtime.   ondansetron (ZOFRAN-ODT) 4 MG disintegrating tablet Take 1 tablet (4 mg total) by mouth every 6 (six) hours as needed for nausea or vomiting.   sertraline (ZOLOFT) 50 MG tablet Take 1 tablet (50 mg total) by mouth daily.   traZODone (DESYREL) 100 MG tablet Take 1 tablet (100 mg total) by mouth at bedtime.   [DISCONTINUED] meloxicam (MOBIC) 7.5 MG tablet Take 1 tablet (7.5 mg total) by mouth daily.   nicotine (NICODERM CQ - DOSED IN MG/24 HOURS) 14 mg/24hr patch Place 1 patch (14 mg total) onto the skin daily.   nicotine (NICODERM CQ - DOSED IN MG/24 HOURS) 21 mg/24hr patch Place 1 patch (21 mg total) onto the skin daily.   nicotine (NICODERM CQ - DOSED IN MG/24 HR) 7 mg/24hr patch Place 1 patch (7 mg total) onto the skin daily.   No facility-administered medications prior to visit.    Review of Systems  Constitutional: Negative.   HENT: Negative.    Eyes: Negative.   Respiratory: Negative.  Negative for shortness of breath.   Cardiovascular: Negative.  Negative for chest pain.  Gastrointestinal: Negative.  Negative for abdominal pain, constipation and diarrhea.  Genitourinary: Negative.   Musculoskeletal:  Positive for joint pain. Negative for myalgias.   Skin: Negative.   Neurological: Negative.  Negative for dizziness and headaches.  Endo/Heme/Allergies: Negative.   All other systems reviewed and are negative.      Objective:   BP 112/81   Pulse 67   Ht 6' (1.829 m)   Wt 223 lb 14.4 oz (101.6 kg)   SpO2 98%   BMI 30.37 kg/m   Vitals:   09/25/23 1129  BP: 112/81  Pulse: 67  Height: 6' (1.829 m)  Weight: 223 lb 14.4 oz (101.6 kg)  SpO2: 98%  BMI (Calculated): 30.36    Physical Exam Nursing note reviewed.  Constitutional:      Appearance: Normal appearance. He is normal weight.  HENT:     Head: Normocephalic and atraumatic.     Nose:  Nose normal.     Mouth/Throat:     Mouth: Mucous membranes are moist.     Pharynx: Oropharynx is clear.  Eyes:     Extraocular Movements: Extraocular movements intact.     Conjunctiva/sclera: Conjunctivae normal.     Pupils: Pupils are equal, round, and reactive to light.  Cardiovascular:     Rate and Rhythm: Normal rate and regular rhythm.     Pulses: Normal pulses.     Heart sounds: Normal heart sounds.  Pulmonary:     Effort: Pulmonary effort is normal.     Breath sounds: Normal breath sounds.  Abdominal:     General: Abdomen is flat. Bowel sounds are normal.     Palpations: Abdomen is soft.  Musculoskeletal:        General: Normal range of motion.     Cervical back: Normal range of motion.  Skin:    General: Skin is warm and dry.  Neurological:     General: No focal deficit present.     Mental Status: He is alert and oriented to person, place, and time.  Psychiatric:        Mood and Affect: Mood normal.        Behavior: Behavior normal.        Thought Content: Thought content normal.        Judgment: Judgment normal.      No results found for any visits on 09/25/23.  Recent Results (from the past 2160 hour(s))  Lipid Profile     Status: Abnormal   Collection Time: 07/26/23 12:07 PM  Result Value Ref Range   Cholesterol, Total 214 (H) 100 - 199 mg/dL    Triglycerides 55 0 - 149 mg/dL   HDL 58 >44 mg/dL   VLDL Cholesterol Cal 10 5 - 40 mg/dL   LDL Chol Calc (NIH) 010 (H) 0 - 99 mg/dL   Chol/HDL Ratio 3.7 0.0 - 5.0 ratio    Comment:                                   T. Chol/HDL Ratio                                             Men  Women                               1/2 Avg.Risk  3.4    3.3                                   Avg.Risk  5.0    4.4                                2X Avg.Risk  9.6    7.1                                3X Avg.Risk 23.4   11.0   Hemoglobin A1c     Status: None   Collection Time: 07/26/23 12:07 PM  Result Value Ref Range  Hgb A1c MFr Bld 5.5 4.8 - 5.6 %    Comment:          Prediabetes: 5.7 - 6.4          Diabetes: >6.4          Glycemic control for adults with diabetes: <7.0    Est. average glucose Bld gHb Est-mCnc 111 mg/dL  TSH     Status: None   Collection Time: 07/26/23 12:07 PM  Result Value Ref Range   TSH 1.210 0.450 - 4.500 uIU/mL  CMP14+EGFR     Status: Abnormal   Collection Time: 07/26/23 12:07 PM  Result Value Ref Range   Glucose 83 70 - 99 mg/dL   BUN 8 6 - 20 mg/dL   Creatinine, Ser 6.57 0.76 - 1.27 mg/dL   eGFR 82 >84 ON/GEX/5.28   BUN/Creatinine Ratio 7 (L) 9 - 20   Sodium 139 134 - 144 mmol/L   Potassium 4.6 3.5 - 5.2 mmol/L   Chloride 103 96 - 106 mmol/L   CO2 23 20 - 29 mmol/L   Calcium 9.5 8.7 - 10.2 mg/dL   Total Protein 6.1 6.0 - 8.5 g/dL   Albumin 3.9 (L) 4.1 - 5.1 g/dL   Globulin, Total 2.2 1.5 - 4.5 g/dL   Bilirubin Total 0.3 0.0 - 1.2 mg/dL   Alkaline Phosphatase 45 44 - 121 IU/L   AST 30 0 - 40 IU/L   ALT 28 0 - 44 IU/L      Assessment & Plan:  Mobic for knee pain Referral sent to orthopaedics  Low fat diet to lower LDL.   Problem List Items Addressed This Visit       Other   Acute pain of left knee - Primary   Relevant Orders   Ambulatory referral to Orthopedic Surgery    Return in about 3 months (around 12/24/2023) for with fasting labs prior.    Total time spent: 25 minutes  [employer], NP  09/25/2023   This document may have been prepared by Dragon Voice Recognition software and as such may include unintentional dictation errors.

## 2023-11-16 ENCOUNTER — Telehealth (HOSPITAL_COMMUNITY): Payer: Self-pay

## 2023-11-16 ENCOUNTER — Ambulatory Visit (INDEPENDENT_AMBULATORY_CARE_PROVIDER_SITE_OTHER): Payer: 59 | Admitting: Physician Assistant

## 2023-11-16 VITALS — BP 148/94 | HR 83 | Temp 97.8°F | Ht 72.0 in | Wt 218.6 lb

## 2023-11-16 DIAGNOSIS — F609 Personality disorder, unspecified: Secondary | ICD-10-CM | POA: Diagnosis not present

## 2023-11-16 DIAGNOSIS — F4325 Adjustment disorder with mixed disturbance of emotions and conduct: Secondary | ICD-10-CM

## 2023-11-16 DIAGNOSIS — F333 Major depressive disorder, recurrent, severe with psychotic symptoms: Secondary | ICD-10-CM | POA: Diagnosis not present

## 2023-11-16 DIAGNOSIS — F411 Generalized anxiety disorder: Secondary | ICD-10-CM | POA: Diagnosis not present

## 2023-11-16 MED ORDER — SERTRALINE HCL 100 MG PO TABS
100.0000 mg | ORAL_TABLET | Freq: Every day | ORAL | 1 refills | Status: DC
Start: 2023-11-16 — End: 2024-08-01

## 2023-11-16 MED ORDER — HYDROXYZINE HCL 50 MG PO TABS
50.0000 mg | ORAL_TABLET | Freq: Three times a day (TID) | ORAL | 1 refills | Status: DC | PRN
Start: 2023-11-16 — End: 2024-08-01

## 2023-11-16 MED ORDER — TRAZODONE HCL 100 MG PO TABS: 100.0000 mg | ORAL_TABLET | Freq: Every day | ORAL | 1 refills | Status: DC

## 2023-11-16 MED ORDER — OLANZAPINE 5 MG PO TABS
5.0000 mg | ORAL_TABLET | Freq: Every day | ORAL | 1 refills | Status: DC
Start: 2023-11-16 — End: 2024-08-01

## 2023-11-16 NOTE — Telephone Encounter (Signed)
Evelena Leyden with [facility] emailed requesting update on this Pt and this therapist sent the following email: "Jason Coop saw Denton Brick, LCSW for an assessment today. He reported he wants to participate in medication management and after he finishes his classes, he will engage in therapy. He has an appointment with Otila Back, PA on 12-26-23"  Remigio Eisenmenger, MS, LMFT, LCAS

## 2023-11-16 NOTE — Progress Notes (Signed)
Psychiatric Initial Adult Assessment   Patient Identification: Tyler [name] MRN:  [id number] Date of Evaluation:  11/16/2023 Referral Source: Not applicable Chief Complaint:   Chief Complaint  Patient presents with   Follow-up   Medication Management   Visit Diagnosis:    ICD-10-CM   1. Major depressive disorder, recurrent episode, severe, with psychosis (HCC)  F33.3 sertraline (ZOLOFT) 100 MG tablet    traZODone (DESYREL) 100 MG tablet    OLANZapine (ZYPREXA) 5 MG tablet    2. Personality disorder (HCC)  F60.9 OLANZapine (ZYPREXA) 5 MG tablet    3. Generalized anxiety disorder  F41.1 hydrOXYzine (ATARAX) 50 MG tablet    4. Adjustment disorder with mixed disturbance of emotions and conduct  F43.25       History of Present Illness:    Tyler [name] is a 38 year old male with a past psychiatric history significant for personality disorder, generalized anxiety disorder, and major depressive disorder (recurrent episode, severe, with psychosis).  He presents to [facility] to establish psychiatric care for medication management.  Patient presents to the encounter for a court-ordered medical evaluation in order to take a CBI (intervention course).  Patient is currently taking 3 psychiatric medications for his mental health and one medications for sleep.  Patient is currently on the following psychiatric medications:  Olanzapine 5 mg at bedtime Sertraline 50 mg daily Hydroxyzine 50 mg 3 times daily Trazodone 100 mg at bedtime  Patient reports that he has been on this current medication regimen for 4 to 5 years.  Patient is unsure of his psychiatric diagnoses.  He reports that he has been hospitalized twice.  He states that his first hospitalization was voluntary due to voices attributed to schizophrenia.  He reports that his other hospitalization was due to involuntary commitment.  Patient denies any issues currently.  He does report that he  occasionally gets easily irritable; however, his anxiety medication has been helpful in maintaining his mood.  Patient reports that he takes his medications regularly and denies having any issues at this time.  He reports that the only time he has issues with his mental health is when he forgets to take his medications.  Despite taking his medications regularly, patient continues to endorse ongoing depression he rates a 7-8 out of 10 with 10 being most severe.  Patient reports that his depression is attributed to not being able to see his kid.  Patient reports that he has not seen his daughter for almost a year and is currently separated from his former wife.  Patient endorses depressive episodes a couple of days per week.  Patient endorses the following depressive symptoms: feelings of sadness, irritability (patient states that he often wants to break things when angry), lack of motivation, decreased concentration, feelings of guilt/worthlessness, and hopelessness.  Patient denies decreased energy.  He reports that his depression has made better when with his stepdaughter and girlfriend.  He reports that his depression is worsened by not being with his daughter.  In addition to his depression, patient endorses anxiety and rates his anxiety an 8 out of 10.  Patient's main stressor is being in between jobs at this time.  Patient denies experiencing panic attacks.  Patient also states that he occasionally hears voices roughly 3-4 times per week.  Patient describes the voices as a different personality.  He reports that he has 3 personalities.  He reports that one personality is talkative, one is is very angry, and the other is very  sad.  In regards to visual hallucinations, patient states that he is able to see his personality and states that they are all male.  He reports that one personality is scary, one personality is not scary, and the other personality is funny looking.  Patient endorses a past history of  hospitalization due to mental health.  Patient endorses a past history of suicide attempt stating that he last attempted suicide roughly 2 years ago.  A PHQ-9 screen was performed with the patient scoring a 6.  A GAD-7 screen was also performed with the patient scoring at 13.  Patient is alert and oriented x 4, calm, cooperative, and fully engaged in conversation during the encounter.  Patient states that he feels good at this time.  Patient denies suicidal or homicidal ideations.  He further denies auditory or visual hallucinations at this time and does not appear to be responding to internal/external stimuli.  Patient denies paranoia or delusional thoughts.  Patient endorses fair sleep and receives on average 6 to 7 hours of sleep per night.  Patient reports that he sleeps a couple of hours at a time.  Patient endorses good appetite and eats on average 3 meals per day.  Patient endorses alcohol consumption every day stating that he drinks at least one 16 ounce beer per day.  Patient endorses tobacco use and smokes on average less than 1/2 pack/day.  Patient endorses illicit drug use in the form of marijuana.  Associated Signs/Symptoms: Depression Symptoms:  depressed mood, anhedonia, hypersomnia, psychomotor agitation, psychomotor retardation, feelings of worthlessness/guilt, difficulty concentrating, hopelessness, impaired memory, anxiety, decreased labido, (Hypo) Manic Symptoms:  Distractibility, Flight of Ideas, Licensed conveyancer, Labiality of Mood, Anxiety Symptoms:  Agoraphobia, Excessive Worry, Obsessive Compulsive Symptoms:   Patient reports that everything has to be in place, Social Anxiety, Psychotic Symptoms:   Patient denies PTSD Symptoms: Had a traumatic exposure:  Patient reports that he has lost a couple of loved ones along the way. Patient reports that his aunt passed away after his birthday. Patient states that he also lost two uncles as well a cousin. Had a  traumatic exposure in the last month:  N/A Re-experiencing:  Flashbacks Intrusive Thoughts Hypervigilance:  Yes Hyperarousal:  Difficulty Concentrating Increased Startle Response Irritability/Anger Avoidance:  Decreased Interest/Participation Foreshortened Future  Past Psychiatric History:  Patient has a past psychiatric history significant for personality disorder, generalized anxiety disorder, and major depressive disorder (recurrent episode, severe with psychosis).  Patient endorses a past history of hospitalization due to mental health.  Patient endorses a past history of suicide attempt stating that he last attempted roughly 2 years ago.  During this attempt, patient states that he wrapped a belt around his neck.  Patient denies a past history of homicide attempt  Previous Psychotropic Medications: Yes , patient reports that he has been on olanzapine 5 mg at bedtime, sertraline 50 mg daily, hydroxyzine 50 mg 3 times daily as needed, and trazodone 100 mg at bedtime as needed.  Substance Abuse History in the last 12 months:  Yes.    Consequences of Substance Abuse: Patient states that he used to have a history of alcohol abuse  Medical Consequences:  Patient endorses a past history of hospitalization due to substance abuse Legal Consequences:  Patient denies Family Consequences:  Patient denies Blackouts:  Patient endorses a past history of blacking out DT's: Patient denies Withdrawal Symptoms:   Tremors  Past Medical History:  Past Medical History:  Diagnosis Date   Anxiety  panic and SOB    Past Surgical History:  Procedure Laterality Date   IRRIGATION AND DEBRIDEMENT ABSCESS N/A 08/04/2020   Procedure: IRRIGATION AND DEBRIDEMENT PERINEAL ABSCESS, Lovett Sox;  Surgeon: Bjorn Pippin, MD;  Location: [facility];  Service: Urology;  Laterality: N/A;   IRRIGATION AND DEBRIDEMENT ABSCESS N/A 10/20/2020   Procedure: IRRIGATION AND DEBRIDEMENT PERINEAL  ABSCESS;   Surgeon: Sebastian Ache, MD;  Location: [facility];  Service: Urology;  Laterality: N/A;   IRRIGATION AND DEBRIDEMENT BUTTOCKS N/A 10/28/2020   Procedure: IRRIGATION AND DEBRIDEMENT PERINEAL ABCESS;  Surgeon: Abigail Miyamoto, MD;  Location: [facility];  Service: General;  Laterality: N/A;  packing 2" gauze   TRANSURETHRAL RESECTION OF PROSTATE  07/21/2020   Procedure: TRANSURETHRAL RESECTION OF THE PROSTATE (TURP);  Surgeon: Rene Paci, MD;  Location: [facility];  Service: Urology;;   TRANSURETHRAL RESECTION OF PROSTATE N/A 10/20/2020   Procedure: CYSTOSCOPY, TRANSURETHRAL RESECTION OF THE PROSTATE ABCESS(TURP);  Surgeon: Sebastian Ache, MD;  Location: [facility];  Service: Urology;  Laterality: N/A;    Family Psychiatric History:  Patient denies a family history of psychiatric illness  Family history of suicide attempt: Patient denies Family history of homicide attempt: Patient denies Family history of substance abuse: Patient states that certain members of his family abuse of marijuana and alcohol  Family History: History reviewed. No pertinent family history.  Social History:   Social History   Socioeconomic History   Marital status: Single    Spouse name: Not on file   Number of children: Not on file   Years of education: Not on file   Highest education level: Not on file  Occupational History   Not on file  Tobacco Use   Smoking status: Former    Current packs/day: 1.00    Types: Cigarettes   Smokeless tobacco: Never  Vaping Use   Vaping status: Never Used  Substance and Sexual Activity   Alcohol use: Yes    Alcohol/week: 2.0 standard drinks of alcohol    Types: 2 Cans of beer per week   Drug use: Not Currently    Types: Marijuana   Sexual activity: Yes    Partners: Female    Birth control/protection: None  Other Topics Concern   Not on file  Social History Narrative   Not on file   Social Drivers of Health   Financial Resource Strain: High Risk (09/18/2023)    Overall Financial Resource Strain (CARDIA)    Difficulty of Paying Living Expenses: Very hard  Food Insecurity: Food Insecurity Present (09/18/2023)   Hunger Vital Sign    Worried About Running Out of Food in the Last Year: Sometimes true    Ran Out of Food in the Last Year: Sometimes true  Transportation Needs: Unmet Transportation Needs (09/18/2023)   PRAPARE - Transportation    Lack of Transportation (Medical): Yes    Lack of Transportation (Non-Medical): Yes  Physical Activity: Sufficiently Active (09/18/2023)   Exercise Vital Sign    Days of Exercise per Week: 3 days    Minutes of Exercise per Session: 60 min  Stress: No Stress Concern Present (09/18/2023)   [employer] of Occupational Health - Occupational Stress Questionnaire    Feeling of Stress : Only a little  Social Connections: Socially Isolated (09/18/2023)   Social Connection and Isolation Panel [NHANES]    Frequency of Communication with Friends and Family: Once a week    Frequency of Social Gatherings with Friends and Family: Once a week  Attends Religious Services: More than 4 times per year    Active Member of Clubs or Organizations: No    Attends Banker Meetings: Never    Marital Status: Separated    Additional Social History:  Patient endorses social support.  Patient states that he has children (3 daughters and a son).  Patient denies housing.  Patient denies current employment.  Patient denies a past history of military experience.  Patient states that he went to jail roughly 2 years ago due to violating probation.  Patient states that he has been charged with a DWI and for not making child support payments.  Highest education earned by the patient is 10th grade.  Patient denies access to weapons.  Allergies:  No Known Allergies  Metabolic Disorder Labs: Lab Results  Component Value Date   HGBA1C 5.5 07/26/2023   MPG 99.67 12/22/2021   No results found for: "PROLACTIN" Lab Results   Component Value Date   CHOL 214 (H) 07/26/2023   TRIG 55 07/26/2023   HDL 58 07/26/2023   CHOLHDL 3.7 07/26/2023   VLDL 15 12/22/2021   LDLCALC 146 (H) 07/26/2023   LDLCALC 99 12/22/2021   Lab Results  Component Value Date   TSH 1.210 07/26/2023    Therapeutic Level Labs: No results found for: "LITHIUM" No results found for: "CBMZ" No results found for: "VALPROATE"  Current Medications: Current Outpatient Medications  Medication Sig Dispense Refill   albuterol (VENTOLIN HFA) 108 (90 Base) MCG/ACT inhaler Inhale 2 puffs into the lungs every 6 (six) hours as needed for wheezing or shortness of breath. 8 g 2   cephALEXin (KEFLEX) 500 MG capsule Take 1 capsule (500 mg total) by mouth 2 (two) times daily. 14 capsule 0   hydrOXYzine (ATARAX) 50 MG tablet Take 1 tablet (50 mg total) by mouth 3 (three) times daily as needed for anxiety. 90 tablet 1   ibuprofen (ADVIL) 800 MG tablet Take 1 tablet (800 mg total) by mouth every 8 (eight) hours as needed. 30 tablet 0   meloxicam (MOBIC) 7.5 MG tablet Take 1 tablet (7.5 mg total) by mouth daily. 30 tablet 1   nicotine (NICODERM CQ - DOSED IN MG/24 HOURS) 14 mg/24hr patch Place 1 patch (14 mg total) onto the skin daily. 30 patch 0   nicotine (NICODERM CQ - DOSED IN MG/24 HOURS) 21 mg/24hr patch Place 1 patch (21 mg total) onto the skin daily. 30 patch 0   nicotine (NICODERM CQ - DOSED IN MG/24 HR) 7 mg/24hr patch Place 1 patch (7 mg total) onto the skin daily. 30 patch 0   OLANZapine (ZYPREXA) 5 MG tablet Take 1 tablet (5 mg total) by mouth at bedtime. 30 tablet 1   ondansetron (ZOFRAN-ODT) 4 MG disintegrating tablet Take 1 tablet (4 mg total) by mouth every 6 (six) hours as needed for nausea or vomiting. 20 tablet 0   sertraline (ZOLOFT) 100 MG tablet Take 1 tablet (100 mg total) by mouth daily. 30 tablet 1   traZODone (DESYREL) 100 MG tablet Take 1 tablet (100 mg total) by mouth at bedtime. 30 tablet 1   No current facility-administered  medications for this visit.    Musculoskeletal: Strength & Muscle Tone: within normal limits Gait & Station: normal Patient leans: N/A  Psychiatric Specialty Exam: Review of Systems  Psychiatric/Behavioral:  Positive for dysphoric mood and sleep disturbance. Negative for decreased concentration, hallucinations, self-injury and suicidal ideas. The patient is nervous/anxious. The patient is not hyperactive.  Blood pressure (!) 148/94, pulse 83, temperature 97.8 F (36.6 C), temperature source Oral, height 6' (1.829 m), weight 218 lb 9.6 oz (99.2 kg), SpO2 98%.Body mass index is 29.65 kg/m.  General Appearance: Casual  Eye Contact:  Good  Speech:  Clear and Coherent and Normal Rate  Volume:  Normal  Mood:  Anxious and Depressed  Affect:  Congruent  Thought Process:  Coherent, Goal Directed, and Descriptions of Associations: Intact  Orientation:  Full (Time, Place, and Person)  Thought Content:  Hallucinations: Auditory  Suicidal Thoughts:  No  Homicidal Thoughts:  No  Memory:  Immediate;   Good Recent;   Good Remote;   Fair  Judgement:  Good  Insight:  Good  Psychomotor Activity:  Normal  Concentration:  Concentration: Good and Attention Span: Good  Recall:  Good  Fund of Knowledge:Good  Language: Good  Akathisia:  No  Handed:  Left  AIMS (if indicated):  not done  Assets:  Communication Skills Desire for Improvement Social Support  ADL's:  Intact  Cognition: WNL  Sleep:  Fair   Screenings: AIMS    Flowsheet Row [facility]  AIMS Total Score 0      AUDIT    Flowsheet Row [facility] ([facility]  Alcohol Use Disorder Identification Test Final Score (AUDIT) 2      GAD-7    Flowsheet Row Office Visit from 11/16/2023 in [facility] Counselor from 09/18/2023 in [facility]  Total GAD-7 Score 13 10      PHQ2-9    Flowsheet Row Office Visit from 11/16/2023 in [facility] Counselor from 09/18/2023 in [facility]  PHQ-2 Total Score 2 3  PHQ-9 Total Score 6 11      Flowsheet Row Office Visit from 11/16/2023 in [facility] Most recent reading at 11/16/2023  1:31 PM Counselor from 09/18/2023 in [facility] Most recent reading at 09/18/2023  9:01 AM ED from 09/18/2023 in [facility] Most recent reading at 09/18/2023  7:56 AM  C-SSRS RISK CATEGORY Moderate Risk Low Risk No Risk       Assessment and Plan:   Tyler [name] is a 39 year old male with a past psychiatric history significant for personality disorder, generalized anxiety disorder, and major depressive disorder (recurrent episode, severe, with psychosis).  He presents to [facility] to establish psychiatric care for medication management.  Patient presents to the encounter for a court ordered mental evaluation in order to take an intervention course.  Patient presents to the encounter stating that he is taking the following psychiatric medications:  Olanzapine 5 mg at bedtime Sertraline 50 mg daily Hydroxyzine 50 mg 3 times daily as needed Trazodone 100 mg at bedtime  Patient reports that he takes his medications regularly and denies any adverse side effects.  Patient reports that he still continues to experience ongoing depression as well as elevated anxiety.  Patient endorses having 3 distinct personalities and states that he is able to hear the voices 3-4 times per week.  Patient also endorses visual hallucinations stating that he can see each of the 3 distinct personalities.  The patient reports no issues or concerns regarding his current medication regimen, provider recommended adjusting his sertraline dosage from 50  mg to 100 mg daily for the management  of his depressive symptoms and anxiety.  Patient was agreeable to recommendation.  Patient reports that his personalities are well managed through the use of his current medication regimen.  Patient's medications to be e-prescribed to pharmacy of choice.  Collaboration of Care: Medication Management AEB provider managing patient's psychiatric medications, Primary Care Provider AEB patient being seen by an internal medicine provider, and Psychiatrist AEB provider being followed by a mental health provider at this facility  Patient/Guardian was advised Release of Information must be obtained prior to any record release in order to collaborate their care with an outside provider. Patient/Guardian was advised if they have not already done so to contact the registration department to sign all necessary forms in order for [place] to release information regarding their care.   Consent: Patient/Guardian gives verbal consent for treatment and assignment of benefits for services provided during this visit. Patient/Guardian expressed understanding and agreed to proceed.   1. Major depressive disorder, recurrent episode, severe, with psychosis (HCC) (Primary)  - sertraline (ZOLOFT) 100 MG tablet; Take 1 tablet (100 mg total) by mouth daily.  Dispense: 30 tablet; Refill: 1 - traZODone (DESYREL) 100 MG tablet; Take 1 tablet (100 mg total) by mouth at bedtime.  Dispense: 30 tablet; Refill: 1 - OLANZapine (ZYPREXA) 5 MG tablet; Take 1 tablet (5 mg total) by mouth at bedtime.  Dispense: 30 tablet; Refill: 1  2. Personality disorder (HCC)  - OLANZapine (ZYPREXA) 5 MG tablet; Take 1 tablet (5 mg total) by mouth at bedtime.  Dispense: 30 tablet; Refill: 1  3. Generalized anxiety disorder  - hydrOXYzine (ATARAX) 50 MG tablet; Take 1 tablet (50 mg total) by mouth 3 (three) times daily as needed for anxiety.  Dispense: 90 tablet; Refill: 1  4. Adjustment disorder with mixed  disturbance of emotions and conduct  Patient to follow up in 6 weeks Provider spent a total of 47 minutes with the patient/reviewing patient's chart  Meta Hatchet, PA 11/16/2023, 1:31 PM

## 2023-11-17 ENCOUNTER — Ambulatory Visit (HOSPITAL_COMMUNITY): Payer: 59 | Admitting: Student

## 2023-11-19 ENCOUNTER — Encounter (HOSPITAL_COMMUNITY): Payer: Self-pay | Admitting: Physician Assistant

## 2023-11-23 ENCOUNTER — Telehealth (HOSPITAL_COMMUNITY): Payer: Self-pay | Admitting: Physician Assistant

## 2023-12-26 ENCOUNTER — Encounter (HOSPITAL_COMMUNITY): Payer: 59 | Admitting: Physician Assistant

## 2023-12-28 ENCOUNTER — Ambulatory Visit: Payer: 59 | Admitting: Cardiology

## 2024-01-18 ENCOUNTER — Encounter (HOSPITAL_COMMUNITY): Payer: Self-pay | Admitting: Physician Assistant

## 2024-05-07 ENCOUNTER — Ambulatory Visit: Payer: MEDICAID | Admitting: Family Medicine

## 2024-05-07 DIAGNOSIS — Z202 Contact with and (suspected) exposure to infections with a predominantly sexual mode of transmission: Secondary | ICD-10-CM

## 2024-05-07 DIAGNOSIS — Z113 Encounter for screening for infections with a predominantly sexual mode of transmission: Secondary | ICD-10-CM

## 2024-05-07 LAB — HM HIV SCREENING LAB: HM HIV Screening: NEGATIVE

## 2024-05-07 LAB — HM HEPATITIS C SCREENING LAB: HM Hepatitis Screen: NEGATIVE

## 2024-05-07 MED ORDER — METRONIDAZOLE 500 MG PO TABS: 2000.0000 mg | ORAL_TABLET | Freq: Once | ORAL | Status: AC

## 2024-05-07 NOTE — Progress Notes (Signed)
 Pt is here for STD screening and a contact Trich. Patient was dispensed Metronidazole  2000 mg Once today. I provided counseling today regarding the medication, the side effects and when to call clinic. Condoms declined and Brochure given. Patient given the opportunity to ask questions for any clarification. Wilkie Drought, RN.

## 2024-05-07 NOTE — Progress Notes (Signed)
 [facility] Department STI clinic [address] [place] [address] Main phone: [phone]  STI screening visit  Subjective:  Tyler [name] is a 38 y.o. male being seen today for an STI screening visit. The patient reports they do not have symptoms, but they were told by a sexual partner that they were diagnosed with trichomonas.   Patient has the following medical conditions:  Patient Active Problem List   Diagnosis Date Noted   Rib pain on left side 08/25/2023   Acute pain of left knee 08/25/2023   Personality disorder (HCC) 12/24/2021   Tinea pedis of both feet 12/24/2021   Vitamin D  deficiency 12/24/2021   Anxiety disorder, unspecified 12/23/2021   Major depressive disorder, recurrent episode, severe, 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/22/2021   Bronchitis 07/14/2021   Tobacco abuse 10/20/2020   Perineal abscess 08/04/2020   Prostate abscess 07/21/2020   Acute respiratory failure with hypoxia (HCC)    Sepsis (HCC)    AKI (acute kidney injury) (HCC) 07/20/2020   Abscess of prostate 07/20/2020   Moderate alcohol use disorder (HCC) 07/04/2020   Marital relationship problem 07/03/2020   Moderate cannabis use disorder (HCC) 07/03/2020   Nicotine  use disorder 07/03/2020   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 Sexual encounter was ~1 week ago. No symptoms. Current smoker, declined quit line card.  See flowsheet for further details and programmatic requirements  Hyperlink available at the top of the signed note in blue.  Flow sheet content below:  Pregnancy Intention Screening Does the patient want to become pregnant in the next year?: No Does the patient's partner want to become pregnant in the next year?: No Would the patient like to  discuss contraceptive options today?: No Reason For STD Screen STD Screening: Is a contact Have you ever had an STD?: Yes History of Antibiotic use in the past 2 weeks?: No STD Symptoms Denies all: Yes Risk Factors for Hep B Household, sexual, or needle sharing contact of a person infected with Hep B: No Sexual contact with a person who uses drugs not as prescribed?: No Currently or Ever used drugs not as prescribed: No HIV Positive: No PRep Patient: No Men who have sex with men: No Have Hepatitis C: No History of Incarceration: Yes History of Homeslessness?: No Anal sex following anal drug use?: No Risk Factors for Hep C Currently using drugs not as prescribed: No Sexual partner(s) currently using drugs as not prescribed: No History of drug use: No HIV Positive: No People with a history of incarceration: Yes People born between the years of 38 and 65: No Hepatitis Counseling Hep B Counseling: Counseled patient about increased risk of Hep B and recommendation for testing, Patient accepts testing for Hep B today Hep C Counseling: Counseled patient about increased risk of Hep C and recommendation for testing, Patient accepts testing for Hep C today Assess Is patient ready to quit in next 30 days?: No Abuse History Has patient ever been abused physically?: No Has patient ever been abused sexually?: No Does patient feel they have a problem with Anxiety?: No Does patient feel they have a problem with Depression?: No Referral to Behavioral Health: No Counseling Patient counseled to use condoms with all sex: Condoms declined Test results given to patient Patient counseled to use condoms  with all sex: Condoms declined  Screening for MPX risk: Does the patient have an unexplained rash? No Is the patient MSM? No Does the patient endorse multiple sex partners or anonymous sex partners? No Did the patient have close or sexual contact with a person diagnosed with MPX? No Has the  patient traveled outside the [place]  where MPX is endemic? No Is there a high clinical suspicion for MPX-- evidenced by one of the following No  -Unlikely to be chickenpox  -Lymphadenopathy  -Rash that present in same phase of evolution on any given body part  STI screening history: Last HIV test per patient/review of record was No results found for: [facility]HIVSCREEN  Lab Results  Component Value Date   HIV Non Reactive 01/27/2023    Last HEPC test per patient/review of record was No results found for: HMHEPCSCREEN No components found for: HEPC   Last HEPB test per patient/review of record was No components found for: HMHEPBSCREEN   Fertility: Does the patient or their partner desires a pregnancy in the next year? No  Immunization History  Administered Date(s) Administered   Influenza, Mdck, Trivalent,PF 6+ MOS(egg free) 08/25/2023   Influenza,inj,Quad PF,6+ Mos 08/05/2020, 12/24/2021   Tdap 11/15/2012    The following portions of the patient's history were reviewed and updated as appropriate: allergies, current medications, past medical history, past social history, past surgical history and problem list.  Objective:  There were no vitals filed for this visit.  Physical Exam Constitutional:      Appearance: Normal appearance.  HENT:     Head: Normocephalic.     Mouth/Throat:     Mouth: Mucous membranes are moist.  Eyes:     General: No scleral icterus.       Right eye: No discharge.        Left eye: No discharge.  Pulmonary:     Effort: Pulmonary effort is normal.  Skin:    General: Skin is warm and dry.  Neurological:     General: No focal deficit present.     Mental Status: He is alert.  Psychiatric:        Mood and Affect: Mood normal.        Behavior: Behavior normal.    Assessment and Plan:  Tyler [name] is a 38 y.o. male presenting to the [facility] Department for STI screening  1. Trichomonas contact (Primary)  - metroNIDAZOLE  (FLAGYL )  500 MG tablet; Take 4 tablets (2,000 mg total) by mouth once for 1 dose.  2. Screening for venereal disease  - Chlamydia/GC NAA, Confirmation - HIV/HCV [facility] - HBV Antigen/Antibody State Lab - Syphilis Serology, [facility]  Patient does not have STI symptoms Patient accepted the following screenings: urine CT/GC, HIV, RPR, Hep B, and Hep C Patient meets criteria for HepB screening? Yes. Ordered? yes Patient meets criteria for HepC screening? Yes. Ordered? yes Recommended condom use with all sex Discussed importance of condom use for STI prevention  Treat positive test results per standing order. Discussed time line for State Lab results and that patient will be called with positive results and encouraged patient to call if he had not heard in 2 weeks Recommended repeat testing in 3 months with positive results. Recommended returning for continued or worsening symptoms.   Return if symptoms worsen or fail to improve.  No future appointments.  Damien FORBES Satchel [facility]

## 2024-05-09 LAB — HBV ANTIGEN/ANTIBODY STATE LAB
Hep B Core Total Ab: NONREACTIVE
Hep B S Ab: REACTIVE
Hepatitis B Surface Antigen: NONREACTIVE

## 2024-05-09 LAB — CHLAMYDIA/GC NAA, CONFIRMATION
Chlamydia trachomatis, NAA: NEGATIVE
Neisseria gonorrhoeae, NAA: NEGATIVE

## 2024-05-15 ENCOUNTER — Encounter: Payer: Self-pay | Admitting: Family Medicine

## 2024-07-31 ENCOUNTER — Ambulatory Visit: Payer: MEDICAID | Admitting: Cardiology

## 2024-08-01 ENCOUNTER — Encounter: Payer: Self-pay | Admitting: Cardiology

## 2024-08-01 ENCOUNTER — Ambulatory Visit: Payer: MEDICAID | Admitting: Cardiology

## 2024-08-01 ENCOUNTER — Ambulatory Visit: Payer: Self-pay | Admitting: Cardiology

## 2024-08-01 VITALS — BP 128/70 | HR 107 | Ht 72.0 in | Wt 220.2 lb

## 2024-08-01 DIAGNOSIS — R300 Dysuria: Secondary | ICD-10-CM

## 2024-08-01 DIAGNOSIS — F609 Personality disorder, unspecified: Secondary | ICD-10-CM

## 2024-08-01 DIAGNOSIS — N3001 Acute cystitis with hematuria: Secondary | ICD-10-CM

## 2024-08-01 DIAGNOSIS — F333 Major depressive disorder, recurrent, severe with psychotic symptoms: Secondary | ICD-10-CM

## 2024-08-01 LAB — POCT URINALYSIS DIPSTICK
Bilirubin, UA: NEGATIVE
Glucose, UA: NEGATIVE
Ketones, UA: POSITIVE
Nitrite, UA: POSITIVE
Protein, UA: POSITIVE — AB
Spec Grav, UA: >=1.03 — AB (ref 1.010–1.025)
Urobilinogen, UA: 0.2 U/dL — AB
pH, UA: 6 (ref 5.0–8.0)

## 2024-08-01 MED ORDER — TRAZODONE HCL 100 MG PO TABS: 100.0000 mg | ORAL_TABLET | Freq: Every day | ORAL | 1 refills | Status: DC

## 2024-08-01 MED ORDER — NITROFURANTOIN MONOHYD MACRO 100 MG PO CAPS: 100.0000 mg | ORAL_CAPSULE | Freq: Two times a day (BID) | ORAL | 0 refills | Status: AC

## 2024-08-01 MED ORDER — ALBUTEROL SULFATE HFA 108 (90 BASE) MCG/ACT IN AERS: 2.0000 | INHALATION_SPRAY | Freq: Four times a day (QID) | RESPIRATORY_TRACT | 2 refills | Status: AC | PRN

## 2024-08-01 MED ORDER — SERTRALINE HCL 100 MG PO TABS: 100.0000 mg | ORAL_TABLET | Freq: Every day | ORAL | 1 refills | Status: DC

## 2024-08-01 MED ORDER — OLANZAPINE 5 MG PO TABS: 5.0000 mg | ORAL_TABLET | Freq: Every day | ORAL | 1 refills | Status: DC

## 2024-08-01 NOTE — Progress Notes (Signed)
 Established Patient Office Visit  Subjective:  Patient ID: Tyler [name], male    DOB: September 24, 1986  Age: 38 y.o. MRN: [id number]  Chief Complaint  Patient presents with   Acute Visit    Pain/pressure when urinating, peeing blood. Requesting new inhaler insurance will not cover the previous one. Also request eye doctor referral.    Patient in office for an acute visit, complaining of pain/pressure when urinating, peeing blood. Patient UA abnormal, will send for culture. Will send in Macrobid.   Urinary Tract Infection  This is a new problem. The current episode started in the past 7 days. The problem occurs every urination. The problem has been unchanged. The quality of the pain is described as aching. Associated symptoms include frequency, hematuria, hesitancy and urgency. He has tried nothing for the symptoms. The treatment provided no relief.    No other concerns at this time.   Past Medical History:  Diagnosis Date   Anxiety    panic and SOB    Past Surgical History:  Procedure Laterality Date   IRRIGATION AND DEBRIDEMENT ABSCESS N/A 08/04/2020   Procedure: IRRIGATION AND DEBRIDEMENT PERINEAL ABSCESS, PHYLLIS LAMBERT;  Surgeon: Watt Rush, MD;  Location: [facility];  Service: Urology;  Laterality: N/A;   IRRIGATION AND DEBRIDEMENT ABSCESS N/A 10/20/2020   Procedure: IRRIGATION AND DEBRIDEMENT PERINEAL  ABSCESS;  Surgeon: Alvaro Hummer, MD;  Location: [facility];  Service: Urology;  Laterality: N/A;   IRRIGATION AND DEBRIDEMENT BUTTOCKS N/A 10/28/2020   Procedure: IRRIGATION AND DEBRIDEMENT PERINEAL ABCESS;  Surgeon: Vernetta Berg, MD;  Location: [facility];  Service: General;  Laterality: N/A;  packing 2 gauze   TRANSURETHRAL RESECTION OF PROSTATE  07/21/2020   Procedure: TRANSURETHRAL RESECTION OF THE PROSTATE (TURP);  Surgeon: Devere Lonni Righter, MD;  Location: [facility];  Service: Urology;;   TRANSURETHRAL RESECTION OF PROSTATE N/A 10/20/2020   Procedure: CYSTOSCOPY,  TRANSURETHRAL RESECTION OF THE PROSTATE ABCESS(TURP);  Surgeon: Alvaro Hummer, MD;  Location: [facility];  Service: Urology;  Laterality: N/A;    Social History   Socioeconomic History   Marital status: Single    Spouse name: Not on file   Number of children: Not on file   Years of education: Not on file   Highest education level: Not on file  Occupational History   Not on file  Tobacco Use   Smoking status: Every Day    Current packs/day: 1.00    Types: Cigarettes   Smokeless tobacco: Never  Vaping Use   Vaping status: Never Used  Substance and Sexual Activity   Alcohol use: Yes    Alcohol/week: 2.0 standard drinks of alcohol    Types: 2 Cans of beer per week   Drug use: Not Currently    Types: Marijuana   Sexual activity: Yes    Partners: Female    Birth control/protection: None  Other Topics Concern   Not on file  Social History Narrative   Not on file   Social Drivers of Health   Financial Resource Strain: High Risk (09/18/2023)   Overall Financial Resource Strain (CARDIA)    Difficulty of Paying Living Expenses: Very hard  Food Insecurity: Food Insecurity Present (09/18/2023)   Hunger Vital Sign    Worried About Running Out of Food in the Last Year: Sometimes true    Ran Out of Food in the Last Year: Sometimes true  Transportation Needs: Unmet Transportation Needs (09/18/2023)   PRAPARE - Administrator, Civil Service (Medical): Yes  Lack of Transportation (Non-Medical): Yes  Physical Activity: Sufficiently Active (09/18/2023)   Exercise Vital Sign    Days of Exercise per Week: 3 days    Minutes of Exercise per Session: 60 min  Stress: No Stress Concern Present (09/18/2023)   [employer] of Occupational Health - Occupational Stress Questionnaire    Feeling of Stress : Only a little  Social Connections: Socially Isolated (09/18/2023)   Social Connection and Isolation Panel    Frequency of Communication with Friends and Family: Once a week     Frequency of Social Gatherings with Friends and Family: Once a week    Attends Religious Services: More than 4 times per year    Active Member of [employer] or Organizations: No    Attends Banker Meetings: Never    Marital Status: Separated  Intimate Partner Violence: Not At Risk (09/18/2023)   Humiliation, Afraid, Rape, and Kick questionnaire    Fear of Current or Ex-Partner: No    Emotionally Abused: No    Physically Abused: No    Sexually Abused: No    History reviewed. No pertinent family history.  No Known Allergies  Outpatient Medications Prior to Visit  Medication Sig   [DISCONTINUED] albuterol  (VENTOLIN  HFA) 108 (90 Base) MCG/ACT inhaler Inhale 2 puffs into the lungs every 6 (six) hours as needed for wheezing or shortness of breath.   [DISCONTINUED] OLANZapine  (ZYPREXA ) 5 MG tablet Take 1 tablet (5 mg total) by mouth at bedtime.   [DISCONTINUED] sertraline  (ZOLOFT ) 100 MG tablet Take 1 tablet (100 mg total) by mouth daily.   [DISCONTINUED] traZODone  (DESYREL ) 100 MG tablet Take 1 tablet (100 mg total) by mouth at bedtime.   [DISCONTINUED] cephALEXin  (KEFLEX ) 500 MG capsule Take 1 capsule (500 mg total) by mouth 2 (two) times daily. (Patient not taking: Reported on 08/01/2024)   [DISCONTINUED] hydrOXYzine  (ATARAX ) 50 MG tablet Take 1 tablet (50 mg total) by mouth 3 (three) times daily as needed for anxiety. (Patient not taking: Reported on 08/01/2024)   [DISCONTINUED] ibuprofen  (ADVIL ) 800 MG tablet Take 1 tablet (800 mg total) by mouth every 8 (eight) hours as needed. (Patient not taking: Reported on 08/01/2024)   [DISCONTINUED] meloxicam  (MOBIC ) 7.5 MG tablet Take 1 tablet (7.5 mg total) by mouth daily. (Patient not taking: Reported on 08/01/2024)   [DISCONTINUED] ondansetron  (ZOFRAN -ODT) 4 MG disintegrating tablet Take 1 tablet (4 mg total) by mouth every 6 (six) hours as needed for nausea or vomiting. (Patient not taking: Reported on 08/01/2024)   No  facility-administered medications prior to visit.    Review of Systems  Constitutional: Negative.   HENT: Negative.    Eyes: Negative.   Respiratory: Negative.  Negative for shortness of breath.   Cardiovascular: Negative.  Negative for chest pain.  Gastrointestinal: Negative.  Negative for abdominal pain, constipation and diarrhea.  Genitourinary:  Positive for dysuria, frequency, hematuria, hesitancy and urgency.  Musculoskeletal:  Negative for joint pain and myalgias.  Skin: Negative.   Neurological: Negative.  Negative for dizziness and headaches.  Endo/Heme/Allergies: Negative.   All other systems reviewed and are negative.      Objective:   BP 128/70   Pulse (!) 107   Ht 6' (1.829 m)   Wt 220 lb 3.2 oz (99.9 kg)   SpO2 96%   BMI 29.86 kg/m   Vitals:   08/01/24 1403  BP: 128/70  Pulse: (!) 107  Height: 6' (1.829 m)  Weight: 220 lb 3.2 oz (99.9 kg)  SpO2: 96%  BMI (Calculated): 29.86    Physical Exam Nursing note reviewed.  Constitutional:      Appearance: Normal appearance. He is normal weight.  HENT:     Head: Normocephalic and atraumatic.     Nose: Nose normal.     Mouth/Throat:     Mouth: Mucous membranes are moist.     Pharynx: Oropharynx is clear.  Eyes:     Extraocular Movements: Extraocular movements intact.     Conjunctiva/sclera: Conjunctivae normal.     Pupils: Pupils are equal, round, and reactive to light.  Cardiovascular:     Rate and Rhythm: Normal rate and regular rhythm.     Pulses: Normal pulses.     Heart sounds: Normal heart sounds.  Pulmonary:     Effort: Pulmonary effort is normal.     Breath sounds: Normal breath sounds.  Abdominal:     General: Abdomen is flat. Bowel sounds are normal.     Palpations: Abdomen is soft.  Musculoskeletal:        General: Normal range of motion.     Cervical back: Normal range of motion.  Skin:    General: Skin is warm and dry.  Neurological:     General: No focal deficit present.      Mental Status: He is alert and oriented to person, place, and time.  Psychiatric:        Mood and Affect: Mood normal.        Behavior: Behavior normal.        Thought Content: Thought content normal.        Judgment: Judgment normal.      Results for orders placed or performed in visit on 08/01/24  POCT urinalysis dipstick  Result Value Ref Range   Color, UA dark yellow    Clarity, UA clear    Glucose, UA Negative Negative   Bilirubin, UA Negative    Ketones, UA Positive    Spec Grav, UA >=1.030 (A) 1.010 - 1.025   Blood, UA 3+ (A)    pH, UA 6.0 5.0 - 8.0   Protein, UA Positive (A) Negative   Urobilinogen, UA 0.2 (A) 0.2 or 1.0 E.U./dL   Nitrite, UA Positive    Leukocytes, UA Moderate (2+) (A) Negative   Appearance     Odor      Recent Results (from the past 2160 hours)  HBV Antigen/Antibody State Lab     Status: None   Collection Time: 05/07/24 12:00 AM  Result Value Ref Range   Hepatitis B Surface Antigen Non Reactive    Hep B S Ab Reactive     Comment: Immunity due to vaccination   Hep B Core Total Ab Non Reactive   [facility] HIV SCREENING LAB     Status: None   Collection Time: 05/07/24 12:00 AM  Result Value Ref Range   HM HIV Screening Negative - Validated   HM HEPATITIS C SCREENING LAB     Status: None   Collection Time: 05/07/24 12:00 AM  Result Value Ref Range   HM Hepatitis Screen Negative-Validated   Chlamydia/GC NAA, Confirmation     Status: None   Collection Time: 05/07/24 10:00 AM   Specimen: Urine   UR  Result Value Ref Range   Chlamydia trachomatis, NAA Negative Negative   Neisseria gonorrhoeae, NAA Negative Negative  POCT urinalysis dipstick     Status: Abnormal   Collection Time: 08/01/24  2:17 PM  Result Value Ref Range   Color, UA dark yellow  Clarity, UA clear    Glucose, UA Negative Negative   Bilirubin, UA Negative    Ketones, UA Positive    Spec Grav, UA >=1.030 (A) 1.010 - 1.025   Blood, UA 3+ (A)    pH, UA 6.0 5.0 - 8.0   Protein,  UA Positive (A) Negative   Urobilinogen, UA 0.2 (A) 0.2 or 1.0 E.U./dL   Nitrite, UA Positive    Leukocytes, UA Moderate (2+) (A) Negative   Appearance     Odor        Assessment & Plan:  Urine culture Macrobid  Problem List Items Addressed This Visit       Genitourinary   Acute cystitis with hematuria - Primary     Other   Major depressive disorder, recurrent episode, severe, with psychosis (HCC)   Relevant Medications   OLANZapine  (ZYPREXA ) 5 MG tablet   sertraline  (ZOLOFT ) 100 MG tablet   traZODone  (DESYREL ) 100 MG tablet   Other Relevant Orders   POCT urinalysis dipstick (Completed)   Personality disorder (HCC)   Relevant Medications   OLANZapine  (ZYPREXA ) 5 MG tablet   Dysuria   Relevant Orders   Urine Culture    Return in about 4 weeks (around 08/29/2024).   Total time spent: 25 minutes. This time includes review of previous notes and results and patient face to face interaction during today's visit.    Jeoffrey Pollen, NP  08/01/2024   This document may have been prepared by Dragon Voice Recognition software and as such may include unintentional dictation errors.

## 2024-08-06 LAB — URINE CULTURE

## 2024-08-08 NOTE — Progress Notes (Signed)
 Left vm to return call.

## 2024-08-12 NOTE — Progress Notes (Signed)
 LM for pt to call back.

## 2024-08-29 ENCOUNTER — Ambulatory Visit: Payer: MEDICAID | Admitting: Cardiology

## 2024-10-28 ENCOUNTER — Emergency Department: Payer: MEDICAID

## 2024-10-28 ENCOUNTER — Encounter: Payer: Self-pay | Admitting: Emergency Medicine

## 2024-10-28 ENCOUNTER — Other Ambulatory Visit: Payer: Self-pay

## 2024-10-28 ENCOUNTER — Emergency Department
Admission: EM | Admit: 2024-10-28 | Discharge: 2024-10-28 | Disposition: A | Discharge status: Home / Self Care | Payer: MEDICAID | Attending: Emergency Medicine | Admitting: Emergency Medicine

## 2024-10-28 ENCOUNTER — Ambulatory Visit
Admission: RE | Admit: 2024-10-28 | Discharge: 2024-10-28 | Disposition: A | Discharge status: Home / Self Care | Payer: MEDICAID | Source: Ambulatory Visit | Attending: Family Medicine | Admitting: Family Medicine

## 2024-10-28 VITALS — BP 112/65 | HR 94 | Temp 101.1°F | Resp 20

## 2024-10-28 DIAGNOSIS — R1032 Left lower quadrant pain: Secondary | ICD-10-CM | POA: Diagnosis not present

## 2024-10-28 DIAGNOSIS — R509 Fever, unspecified: Secondary | ICD-10-CM

## 2024-10-28 DIAGNOSIS — N12 Tubulo-interstitial nephritis, not specified as acute or chronic: Secondary | ICD-10-CM | POA: Insufficient documentation

## 2024-10-28 DIAGNOSIS — R109 Unspecified abdominal pain: Secondary | ICD-10-CM | POA: Diagnosis present

## 2024-10-28 DIAGNOSIS — R1012 Left upper quadrant pain: Secondary | ICD-10-CM

## 2024-10-28 DIAGNOSIS — R197 Diarrhea, unspecified: Secondary | ICD-10-CM | POA: Diagnosis not present

## 2024-10-28 DIAGNOSIS — R1085 Abdominal pain of multiple sites: Secondary | ICD-10-CM

## 2024-10-28 LAB — COMPREHENSIVE METABOLIC PANEL WITH GFR
ALT: 20 U/L (ref 0–44)
AST: 26 U/L (ref 15–41)
Albumin: 3.6 g/dL (ref 3.5–5.0)
Alkaline Phosphatase: 39 U/L (ref 38–126)
Anion gap: 9 (ref 5–15)
BUN: 7 mg/dL (ref 6–20)
CO2: 22 mmol/L (ref 22–32)
Calcium: 9 mg/dL (ref 8.9–10.3)
Chloride: 104 mmol/L (ref 98–111)
Creatinine, Ser: 1.05 mg/dL (ref 0.61–1.24)
GFR, Estimated: >60 mL/min
Glucose, Bld: 97 mg/dL (ref 70–99)
Potassium: 4 mmol/L (ref 3.5–5.1)
Sodium: 135 mmol/L (ref 135–145)
Total Bilirubin: 0.3 mg/dL (ref 0.0–1.2)
Total Protein: 6.2 g/dL — ABNORMAL LOW (ref 6.5–8.1)

## 2024-10-28 LAB — CBC
HCT: 40.3 % (ref 39.0–52.0)
Hemoglobin: 13.4 g/dL (ref 13.0–17.0)
MCH: 27.4 pg (ref 26.0–34.0)
MCHC: 33.3 g/dL (ref 30.0–36.0)
MCV: 82.4 fL (ref 80.0–100.0)
Platelets: 218 K/uL (ref 150–400)
RBC: 4.89 MIL/uL (ref 4.22–5.81)
RDW: 16.5 % — ABNORMAL HIGH (ref 11.5–15.5)
WBC: 7.8 K/uL (ref 4.0–10.5)
nRBC: 0 % (ref 0.0–0.2)

## 2024-10-28 LAB — URINALYSIS, ROUTINE W REFLEX MICROSCOPIC
Bilirubin Urine: NEGATIVE
Glucose, UA: NEGATIVE mg/dL
Ketones, ur: NEGATIVE mg/dL
Nitrite: NEGATIVE
Protein, ur: NEGATIVE mg/dL
Specific Gravity, Urine: >1.046 — ABNORMAL HIGH (ref 1.005–1.030)
WBC, UA: >50 WBC/hpf (ref 0–5)
pH: 5 (ref 5.0–8.0)

## 2024-10-28 LAB — RESP PANEL BY RT-PCR (RSV, FLU A&B, COVID)  RVPGX2
Influenza A by PCR: NEGATIVE
Influenza B by PCR: NEGATIVE
Resp Syncytial Virus by PCR: NEGATIVE
SARS Coronavirus 2 by RT PCR: NEGATIVE

## 2024-10-28 LAB — LIPASE, BLOOD: Lipase: 17 U/L (ref 11–51)

## 2024-10-28 MED ORDER — IOHEXOL 300 MG/ML  SOLN
100.0000 mL | Freq: Once | INTRAMUSCULAR | Status: AC | PRN
  Administered 2024-10-28: 100 mL via INTRAVENOUS

## 2024-10-28 MED ORDER — ACETAMINOPHEN 325 MG PO TABS
650.0000 mg | ORAL_TABLET | Freq: Once | ORAL | Status: AC
  Administered 2024-10-28: 650 mg via ORAL

## 2024-10-28 MED ORDER — SODIUM CHLORIDE 0.9 % IV BOLUS
1000.0000 mL | Freq: Once | INTRAVENOUS | Status: AC
  Administered 2024-10-28: 1000 mL via INTRAVENOUS

## 2024-10-28 MED ORDER — OXYCODONE HCL 5 MG PO TABS: 5.0000 mg | ORAL_TABLET | Freq: Three times a day (TID) | ORAL | 0 refills | Status: AC | PRN

## 2024-10-28 MED ORDER — OXYCODONE HCL 5 MG PO TABS: 5.0000 mg | ORAL_TABLET | Freq: Three times a day (TID) | ORAL | 0 refills | Status: DC | PRN

## 2024-10-28 MED ORDER — MORPHINE SULFATE (PF) 4 MG/ML IV SOLN
4.0000 mg | Freq: Once | INTRAVENOUS | Status: AC
  Administered 2024-10-28: 4 mg via INTRAVENOUS
  Filled 2024-10-28: qty 1

## 2024-10-28 MED ORDER — ACETAMINOPHEN 325 MG PO TABS
325.0000 mg | ORAL_TABLET | Freq: Once | ORAL | Status: AC
  Administered 2024-10-28: 325 mg via ORAL
  Filled 2024-10-28: qty 1

## 2024-10-28 MED ORDER — CEFDINIR 300 MG PO CAPS: 300.0000 mg | ORAL_CAPSULE | Freq: Two times a day (BID) | ORAL | 0 refills | Status: AC

## 2024-10-28 MED ORDER — SODIUM CHLORIDE 0.9 % IV SOLN
2.0000 g | Freq: Once | INTRAVENOUS | Status: AC
  Administered 2024-10-28: 2 g via INTRAVENOUS
  Filled 2024-10-28: qty 20

## 2024-10-28 MED ORDER — CEFDINIR 300 MG PO CAPS: 300.0000 mg | ORAL_CAPSULE | Freq: Two times a day (BID) | ORAL | 0 refills | Status: DC

## 2024-10-28 NOTE — ED Provider Notes (Signed)
 [facility] Provider Note    Event Date/Time   First MD Initiated Contact with Patient 10/28/24 1505     (approximate)   History   Abdominal Pain and Fever   HPI  Tyler [name] is a 39 y.o. male with history of MDD, anxiety, marijuana use, presenting with left-sided abdominal pain.  States that started on Friday.  Associated nausea and diarrhea.  States that is watery.  Denies any recent travel or surgeries.  No vomiting.  No recent antibiotic use.  States he also has a cough.  Denies any chest pain.  States that he feels slightly short of breath when the pain is intense.  When the pain is not less intense, shortness of breath is not there.  He denies any history of kidney stones.  No prior surgical history.  Denies taking medications on a regular basis.  Denies sick contacts.  Per independent history from family, he is otherwise healthy.  On independent chart review, he was seen at urgent care today and sent in for further evaluation, with complaint of 3 days of left upper and lower abdominal pain that goes to his back.  Associated with nausea, chills, body aches and diarrhea.  He received 650 mg of Tylenol  prior to coming in.     Physical Exam   Triage Vital Signs: ED Triage Vitals  Encounter Vitals Group     BP 10/28/24 1408 120/70     Girls Systolic BP Percentile --      Girls Diastolic BP Percentile --      Boys Systolic BP Percentile --      Boys Diastolic BP Percentile --      Pulse Rate 10/28/24 1408 88     Resp 10/28/24 1408 20     Temp 10/28/24 1408 (!) 102.2 F (39 C)     Temp Source 10/28/24 1408 Oral     SpO2 10/28/24 1408 99 %     Weight 10/28/24 1411 220 lb 3.8 oz (99.9 kg)     Height 10/28/24 1411 6' (1.829 m)     Head Circumference --      Peak Flow --      Pain Score 10/28/24 1411 9     Pain Loc --      Pain Education --      Exclude from Growth Chart --     Most recent vital signs: Vitals:   10/28/24 1646 10/28/24 1804   BP: 122/72 122/74  Pulse: 84 83  Resp: 20 18  Temp:  (!) 100.4 F (38 C)  SpO2: 99% 98%     General: Awake, no distress.  CV:  Good peripheral perfusion.  Resp:  Normal effort.  No tachypnea or respiratory distress Abd:  No distention.  Soft, tender to the left upper and left lower quadrant. Other:  L CVA tenderness   ED Results / Procedures / Treatments   Labs (all labs ordered are listed, but only abnormal results are displayed) Labs Reviewed  COMPREHENSIVE METABOLIC PANEL WITH GFR - Abnormal; Notable for the following components:      Result Value   Total Protein 6.2 (*)    All other components within normal limits  CBC - Abnormal; Notable for the following components:   RDW 16.5 (*)    All other components within normal limits  URINALYSIS, ROUTINE W REFLEX MICROSCOPIC - Abnormal; Notable for the following components:   Color, Urine YELLOW (*)    APPearance HAZY (*)  Specific Gravity, Urine >1.046 (*)    Hgb urine dipstick SMALL (*)    Leukocytes,Ua LARGE (*)    Bacteria, UA RARE (*)    All other components within normal limits  RESP PANEL BY RT-PCR (RSV, FLU A&B, COVID)  RVPGX2  LIPASE, BLOOD      RADIOLOGY On my independent interpretation, CT without obvious colitis   PROCEDURES:  Critical Care performed: No  Procedures   MEDICATIONS ORDERED IN ED: Medications  sodium chloride  0.9 % bolus 1,000 mL (0 mLs Intravenous Stopped 10/28/24 1852)  morphine  (PF) 4 MG/ML injection 4 mg (4 mg Intravenous Given 10/28/24 1543)  acetaminophen  (TYLENOL ) tablet 325 mg (325 mg Oral Given 10/28/24 1542)  iohexol  (OMNIPAQUE ) 300 MG/ML solution 100 mL (100 mLs Intravenous Contrast Given 10/28/24 1546)  cefTRIAXone  (ROCEPHIN ) 2 g in sodium chloride  0.9 % 100 mL IVPB (0 g Intravenous Stopped 10/28/24 1825)     IMPRESSION / MDM / ASSESSMENT AND PLAN / ED COURSE  I reviewed the triage vital signs and the nursing notes.                              Differential  diagnosis includes, but is not limited to, colitis, diverticulitis, gastroenteritis, viral illness, IBS, IBD, UTI, pyelonephritis, kidney stone, peptic ulcer disease, gastritis.  Get labs, UA, CT, IV fluids, IV morphine .  Will give him 325 mg of Tylenol .  Patient's presentation is most consistent with acute presentation with potential threat to life or bodily function.  Independent interpretation of labs and imaging below.  On reassessment patient is feeling a lot better.  Discussed with him about imaging and lab results including findings of pyelonephritis.  Patient states that he has had a kidney function in the past.  Did offer admission but patient would like to go home.  Will send prescriptions for antibiotics to his pharmacy as well as a small prescription for oxycodone  for severe breakthrough pain.  Otherwise can take Tylenol  or ibuprofen  as needed for pain.  Discussed with him about following up and seeing his primary care doctor in 2 days to get reassessed.  Otherwise I believe is reasonable for him to be followed up outpatient.  Strict return precautions given.  Shared decision making done with patient and family and they are agreeable with this plan.    Clinical Course as of 10/28/24 1927  Mon Oct 28, 2024  1621 DG Chest 1 View 1. No acute findings.  [TT]  1706 CT ABDOMEN PELVIS W CONTRAST IMPRESSION: 1. Left-sided perinephric stranding with a small amount of perinephric fluid. There is apparent subtle urothelial enhancement in the upper left ureter. , worrisome for acute cystitis with an ascending urinary tract infection. Correlation with urinalysis recommended.   [TT]  1736 Resp panel by RT-PCR (RSV, Flu A&B, Covid) Anterior Nasal Swab Negative [TT]  1742 Urinalysis, Routine w reflex microscopic -Urine, Clean Catch(!) Consistent with UTI. [TT]  1743 On independent review of prior urine cultures, he is growing Klebsiella in the past that is susceptible to ceftriaxone .  Given dose  of ceftriaxone  here. [TT]    Clinical Course User Index [TT] Waymond Lorelle Cummins, MD     FINAL CLINICAL IMPRESSION(S) / ED DIAGNOSES   Final diagnoses:  Abdominal pain of multiple sites  Diarrhea, unspecified type  Fever, unspecified fever cause  Pyelonephritis     Rx / DC Orders   ED Discharge Orders  Ordered    Ambulatory Referral to Primary Care (Establish Care)        10/28/24 1810    oxyCODONE  (ROXICODONE ) 5 MG immediate release tablet  Every 8 hours PRN        10/28/24 1817    cefdinir  (OMNICEF ) 300 MG capsule  2 times daily        10/28/24 1817             Note:  This document was prepared using Dragon voice recognition software and may include unintentional dictation errors.    Waymond Lorelle Cummins, MD 10/28/24 [phone]

## 2024-10-28 NOTE — ED Provider Notes (Signed)
 " [facility]    CSN: [id number] Arrival date & time: 10/28/24  1256      History   Chief Complaint Chief Complaint  Patient presents with   Abdominal Pain    Entered by patient   Fever   Shortness of Breath   Dizziness    HPI Tyler [name] is a 39 y.o. male presents for abdominal pain.  Patient reports 3 days of a worsening left upper and lower abdominal pain that is now radiating through towards his back.  It is associated with nausea without vomiting, chills, body aches, diarrhea.  He reports a mild cough but no congestion, sore throat, ear pain.  Does have shortness of breath due to the abdominal pain.  No history of IBS, Crohn's, colitis, diverticulitis.  No abdominal surgeries.  Currently rates his abdominal pain as a 9 out of 10.  Has not taken anything for symptoms.  No other concerns   Abdominal Pain Associated symptoms: diarrhea, fever, nausea and shortness of breath   Fever Associated symptoms: diarrhea and nausea   Shortness of Breath Associated symptoms: abdominal pain and fever   Dizziness Associated symptoms: diarrhea, nausea and shortness of breath     Past Medical History:  Diagnosis Date   Anxiety    panic and SOB    Patient Active Problem List   Diagnosis Date Noted   Dysuria 08/01/2024   Acute cystitis with hematuria 08/01/2024   Rib pain on left side 08/25/2023   Acute pain of left knee 08/25/2023   Personality disorder (HCC) 12/24/2021   Tinea pedis of both feet 12/24/2021   Vitamin D  deficiency 12/24/2021   Anxiety disorder, unspecified 12/23/2021   Major depressive disorder, recurrent episode, severe, 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/22/2021   Bronchitis 07/14/2021   Tobacco abuse 10/20/2020   Perineal abscess 08/04/2020   Prostate abscess 07/21/2020   Acute respiratory failure with hypoxia (HCC)    Sepsis (HCC)    AKI (acute kidney injury) 07/20/2020   Abscess of prostate 07/20/2020   Moderate alcohol use disorder (HCC) 07/04/2020    Marital relationship problem 07/03/2020   Moderate cannabis use disorder (HCC) 07/03/2020   Nicotine  use disorder 07/03/2020   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    Past Surgical History:  Procedure Laterality Date   IRRIGATION AND DEBRIDEMENT ABSCESS N/A 08/04/2020   Procedure: IRRIGATION AND DEBRIDEMENT PERINEAL ABSCESS, PHYLLIS LAMBERT;  Surgeon: Watt Rush, MD;  Location: [facility];  Service: Urology;  Laterality: N/A;   IRRIGATION AND DEBRIDEMENT ABSCESS N/A 10/20/2020   Procedure: IRRIGATION AND DEBRIDEMENT PERINEAL  ABSCESS;  Surgeon: Alvaro Hummer, MD;  Location: [facility];  Service: Urology;  Laterality: N/A;   IRRIGATION AND DEBRIDEMENT BUTTOCKS N/A 10/28/2020   Procedure: IRRIGATION AND DEBRIDEMENT PERINEAL ABCESS;  Surgeon: Vernetta Berg, MD;  Location: [facility];  Service: General;  Laterality: N/A;  packing 2 gauze   TRANSURETHRAL RESECTION OF PROSTATE  07/21/2020   Procedure: TRANSURETHRAL RESECTION OF THE PROSTATE (TURP);  Surgeon: Devere Lonni Righter, MD;  Location: [facility];  Service: Urology;;   TRANSURETHRAL RESECTION OF PROSTATE N/A 10/20/2020   Procedure: CYSTOSCOPY, TRANSURETHRAL RESECTION OF THE PROSTATE ABCESS(TURP);  Surgeon: Alvaro Hummer, MD;  Location: [facility];  Service: Urology;  Laterality: N/A;       Home Medications    Prior to Admission medications  Medication Sig Start Date End Date Taking? Authorizing Provider  albuterol  (VENTOLIN  HFA) 108 (90 Base) MCG/ACT inhaler Inhale 2 puffs into the  lungs every 6 (six) hours as needed for wheezing or shortness of breath. 08/01/24   Scoggins, Amber, NP  OLANZapine  (ZYPREXA ) 5 MG tablet Take 1 tablet (5 mg total) by mouth at bedtime. 08/01/24 08/01/25  Scoggins, Amber, NP  sertraline  (ZOLOFT ) 100 MG tablet Take 1 tablet (100 mg total) by mouth daily. 08/01/24   Scoggins, Amber, NP  traZODone  (DESYREL ) 100 MG tablet Take 1 tablet (100 mg total) by mouth at bedtime.  08/01/24   Scoggins, Amber, NP    Family History History reviewed. No pertinent family history.  Social History Social History[1]   Allergies   Patient has no known allergies.   Review of Systems Review of Systems  Constitutional:  Positive for fever.  Respiratory:  Positive for shortness of breath.   Gastrointestinal:  Positive for abdominal pain, diarrhea and nausea.     Physical Exam Triage Vital Signs ED Triage Vitals  Encounter Vitals Group     BP 10/28/24 1318 112/65     Girls Systolic BP Percentile --      Girls Diastolic BP Percentile --      Boys Systolic BP Percentile --      Boys Diastolic BP Percentile --      Pulse Rate 10/28/24 1318 94     Resp 10/28/24 1318 20     Temp 10/28/24 1318 (!) 101.1 F (38.4 C)     Temp Source 10/28/24 1318 Oral     SpO2 10/28/24 1318 98 %     Weight --      Height --      Head Circumference --      Peak Flow --      Pain Score 10/28/24 1314 9     Pain Loc --      Pain Education --      Exclude from Growth Chart --    No data found.  Updated Vital Signs BP 112/65 (BP Location: Right Arm)   Pulse 94   Temp (!) 101.1 F (38.4 C) (Oral)   Resp 20   SpO2 98%   Visual Acuity Right Eye Distance:   Left Eye Distance:   Bilateral Distance:    Right Eye Near:   Left Eye Near:    Bilateral Near:     Physical Exam Vitals and nursing note reviewed.  Constitutional:      General: He is not in acute distress.    Appearance: Normal appearance. He is not toxic-appearing or diaphoretic.     Comments: Appears uncomfortable  HENT:     Head: Normocephalic and atraumatic.  Eyes:     Pupils: Pupils are equal, round, and reactive to light.  Cardiovascular:     Rate and Rhythm: Normal rate.  Pulmonary:     Effort: Pulmonary effort is normal.  Abdominal:     General: Bowel sounds are normal.     Tenderness: There is abdominal tenderness in the periumbilical area, left upper quadrant and left lower quadrant. There is  guarding. There is no rebound. Negative signs include McBurney's sign.     Comments: Severe left upper and left lower as well as periumbilical abdominal pain.  Skin:    General: Skin is warm and dry.  Neurological:     General: No focal deficit present.     Mental Status: He is alert and oriented to person, place, and time.  Psychiatric:        Mood and Affect: Mood normal.        Behavior:  Behavior normal.      UC Treatments / Results  Labs (all labs ordered are listed, but only abnormal results are displayed) Labs Reviewed - No data to display  EKG   Radiology No results found.  Procedures Procedures (including critical care time)  Medications Ordered in UC Medications  acetaminophen  (TYLENOL ) tablet 650 mg (650 mg Oral Given 10/28/24 1323)    Initial Impression / Assessment and Plan / UC Course  I have reviewed the triage vital signs and the nursing notes.  Pertinent labs & imaging results that were available during my care of the patient were reviewed by me and considered in my medical decision making (see chart for details).     Reviewed exam and symptoms with patient.  Given his severe abdominal pain and fever advised ER evaluation for possible imaging.  He is in agreement plan will go POV with family driving.  He was given Tylenol  for fever. Final Clinical Impressions(s) / UC Diagnoses   Final diagnoses:  Left lower quadrant abdominal pain  Abdominal pain, left upper quadrant  Fever, unspecified     Discharge Instructions      Please go to the emergency for further evaluation of your high fever and severe abdominal pain    ED Prescriptions   None    PDMP not reviewed this encounter.     [1]  Social History Tobacco Use   Smoking status: Every Day    Current packs/day: 1.00    Types: Cigarettes   Smokeless tobacco: Never  Vaping Use   Vaping status: Never Used  Substance Use Topics   Alcohol use: Yes    Alcohol/week: 2.0 standard drinks  of alcohol    Types: 2 Cans of beer per week   Drug use: Not Currently    Types: Marijuana     Loreda Myla SAUNDERS, NP 10/28/24 1334  "

## 2024-10-28 NOTE — Discharge Instructions (Signed)
 Please take the antibiotics as prescribed for your kidney infection.  Please take 650 mg of Tylenol  4 to milligrams of ibuprofen  every 6 hours as needed for pain.  Please reserve the oxycodone  for severe breakthrough pain.  Please see your primary care doctor in 2 days to get reassessed and to make sure that you are feeling better.  If you have any vomiting, unable to tolerate your antibiotics, if you feel worse or have persistent high fevers, please return to be seen.

## 2024-10-28 NOTE — ED Triage Notes (Signed)
 Pt reports mid abd pain that radiates to left side since Saturday. Pt given tylenol  at UC before coming here. Also reports dizziness and SOB. Reports daughter may have had flu at Christmas.

## 2024-10-28 NOTE — Discharge Instructions (Signed)
 Please go to the emergency for further evaluation of your high fever and severe abdominal pain

## 2024-10-28 NOTE — ED Notes (Signed)
 Patient is being discharged from the Urgent Care and sent to the Emergency Department via POV . Per Myla Bold, NP, patient is in need of higher level of care due to severe abdominal pain and fever. Patient is aware and verbalizes understanding of plan of care.  Vitals:   10/28/24 1318  BP: 112/65  Pulse: 94  Resp: 20  Temp: (!) 101.1 F (38.4 C)  SpO2: 98%

## 2024-10-28 NOTE — ED Triage Notes (Signed)
 Patient reports mid and left sided abdominal pain, SOB and lightheaded x 4 days. Patient has not taken anything for symptoms. Rates pain 9/10.

## 2024-10-30 ENCOUNTER — Encounter: Payer: Self-pay | Admitting: Cardiology

## 2024-10-30 ENCOUNTER — Ambulatory Visit: Payer: Self-pay | Admitting: Cardiology

## 2024-10-30 ENCOUNTER — Ambulatory Visit: Payer: MEDICAID | Admitting: Cardiology

## 2024-10-30 VITALS — BP 104/82 | HR 82 | Temp 98.3°F | Ht 72.0 in | Wt 215.4 lb

## 2024-10-30 DIAGNOSIS — N390 Urinary tract infection, site not specified: Secondary | ICD-10-CM | POA: Diagnosis not present

## 2024-10-30 DIAGNOSIS — M79671 Pain in right foot: Secondary | ICD-10-CM

## 2024-10-30 DIAGNOSIS — Z0130 Encounter for examination of blood pressure without abnormal findings: Secondary | ICD-10-CM

## 2024-10-30 DIAGNOSIS — Z1329 Encounter for screening for other suspected endocrine disorder: Secondary | ICD-10-CM | POA: Diagnosis not present

## 2024-10-30 DIAGNOSIS — N3001 Acute cystitis with hematuria: Secondary | ICD-10-CM | POA: Diagnosis not present

## 2024-10-30 DIAGNOSIS — Z13220 Encounter for screening for lipoid disorders: Secondary | ICD-10-CM | POA: Diagnosis not present

## 2024-10-30 DIAGNOSIS — F333 Major depressive disorder, recurrent, severe with psychotic symptoms: Secondary | ICD-10-CM | POA: Diagnosis not present

## 2024-10-30 DIAGNOSIS — F609 Personality disorder, unspecified: Secondary | ICD-10-CM

## 2024-10-30 DIAGNOSIS — M79672 Pain in left foot: Secondary | ICD-10-CM | POA: Diagnosis not present

## 2024-10-30 DIAGNOSIS — Z131 Encounter for screening for diabetes mellitus: Secondary | ICD-10-CM

## 2024-10-30 LAB — POCT URINALYSIS DIPSTICK
Bilirubin, UA: NEGATIVE
Blood, UA: NEGATIVE
Glucose, UA: NEGATIVE
Ketones, UA: NEGATIVE
Nitrite, UA: NEGATIVE
Protein, UA: NEGATIVE
Spec Grav, UA: 1.015
Urobilinogen, UA: 0.2 U/dL
pH, UA: 6

## 2024-10-30 MED ORDER — SERTRALINE HCL 100 MG PO TABS: 100.0000 mg | ORAL_TABLET | Freq: Every day | ORAL | 1 refills | Status: AC

## 2024-10-30 MED ORDER — OLANZAPINE 5 MG PO TABS: 5.0000 mg | ORAL_TABLET | Freq: Every day | ORAL | 1 refills | Status: AC

## 2024-10-30 MED ORDER — TRAZODONE HCL 100 MG PO TABS: 100.0000 mg | ORAL_TABLET | Freq: Every day | ORAL | 1 refills | Status: AC

## 2024-10-30 MED ORDER — IBUPROFEN 800 MG PO TABS: 800.0000 mg | ORAL_TABLET | Freq: Three times a day (TID) | ORAL | 0 refills | Status: AC | PRN

## 2024-10-30 NOTE — Progress Notes (Signed)
 "  Established Patient Office Visit  Subjective:  Patient ID: Tyler [name], male    DOB: [birth year]  Age: 39 y.o. MRN: [id number]  Chief Complaint  Patient presents with   Follow-up    ER follow up    Patient in office for an acute visit, hospital follow up. Patient went to the ED on 10/28/24 for lower abdominal pain. Patient found to have large amount of leukocytes. Treated with Omnicef , 8 days remaining. UA today 2+ leukocytes, will send for culture. Patient has frequent UTIs, will send referral to urology.  Patient due for lab work, will do today.  Patient requesting a referral to podiatry. Complains of foot pain, holes' between his toes. On exam, no open wounds noted between toes.  Continue current medications.     No other concerns at this time.   Past Medical History:  Diagnosis Date   Anxiety    panic and SOB    Past Surgical History:  Procedure Laterality Date   IRRIGATION AND DEBRIDEMENT ABSCESS N/A 08/04/2020   Procedure: IRRIGATION AND DEBRIDEMENT PERINEAL ABSCESS, PHYLLIS LAMBERT;  Surgeon: Watt Rush, MD;  Location: [facility];  Service: Urology;  Laterality: N/A;   IRRIGATION AND DEBRIDEMENT ABSCESS N/A 10/20/2020   Procedure: IRRIGATION AND DEBRIDEMENT PERINEAL  ABSCESS;  Surgeon: Alvaro Hummer, MD;  Location: [facility];  Service: Urology;  Laterality: N/A;   IRRIGATION AND DEBRIDEMENT BUTTOCKS N/A 10/28/2020   Procedure: IRRIGATION AND DEBRIDEMENT PERINEAL ABCESS;  Surgeon: Vernetta Berg, MD;  Location: [facility];  Service: General;  Laterality: N/A;  packing 2 gauze   TRANSURETHRAL RESECTION OF PROSTATE  07/21/2020   Procedure: TRANSURETHRAL RESECTION OF THE PROSTATE (TURP);  Surgeon: Devere Lonni Righter, MD;  Location: [facility];  Service: Urology;;   TRANSURETHRAL RESECTION OF PROSTATE N/A 10/20/2020   Procedure: CYSTOSCOPY, TRANSURETHRAL RESECTION OF THE PROSTATE ABCESS(TURP);  Surgeon: Alvaro Hummer, MD;  Location: [facility];  Service: Urology;   Laterality: N/A;    Social History   Socioeconomic History   Marital status: Single    Spouse name: Not on file   Number of children: Not on file   Years of education: Not on file   Highest education level: Not on file  Occupational History   Not on file  Tobacco Use   Smoking status: Every Day    Current packs/day: 1.00    Types: Cigarettes   Smokeless tobacco: Never  Vaping Use   Vaping status: Never Used  Substance and Sexual Activity   Alcohol use: Yes    Alcohol/week: 2.0 standard drinks of alcohol    Types: 2 Cans of beer per week   Drug use: Not Currently    Types: Marijuana   Sexual activity: Yes    Partners: Female    Birth control/protection: None  Other Topics Concern   Not on file  Social History Narrative   Not on file   Social Drivers of Health   Tobacco Use: High Risk (10/30/2024)   Patient History    Smoking Tobacco Use: Every Day    Smokeless Tobacco Use: Never    Passive Exposure: Not on file  Financial Resource Strain: High Risk (09/18/2023)   Overall Financial Resource Strain (CARDIA)    Difficulty of Paying Living Expenses: Very hard  Food Insecurity: Food Insecurity Present (09/18/2023)   Hunger Vital Sign    Worried About Running Out of Food in the Last Year: Sometimes true    Ran Out of Food in the Last Year: Sometimes true  Transportation Needs: Unmet Transportation Needs (09/18/2023)   PRAPARE - Transportation    Lack of Transportation (Medical): Yes    Lack of Transportation (Non-Medical): Yes  Physical Activity: Sufficiently Active (09/18/2023)   Exercise Vital Sign    Days of Exercise per Week: 3 days    Minutes of Exercise per Session: 60 min  Stress: No Stress Concern Present (09/18/2023)   [employer] of Occupational Health - Occupational Stress Questionnaire    Feeling of Stress : Only a little  Social Connections: Socially Isolated (09/18/2023)   Social Connection and Isolation Panel    Frequency of Communication with  Friends and Family: Once a week    Frequency of Social Gatherings with Friends and Family: Once a week    Attends Religious Services: More than 4 times per year    Active Member of [employer] or Organizations: No    Attends Banker Meetings: Never    Marital Status: Separated  Intimate Partner Violence: Not At Risk (09/18/2023)   Humiliation, Afraid, Rape, and Kick questionnaire    Fear of Current or Ex-Partner: No    Emotionally Abused: No    Physically Abused: No    Sexually Abused: No  Depression (PHQ2-9): Medium Risk (11/16/2023)   Depression (PHQ2-9)    PHQ-2 Score: 6  Alcohol Screen: Low Risk (12/23/2021)   Alcohol Screen    Last Alcohol Screening Score (AUDIT): 2  Housing: Low Risk (09/18/2023)   Housing    Last Housing Risk Score: 0  Utilities: Not At Risk (09/18/2023)   AHC Utilities    Threatened with loss of utilities: No  Health Literacy: Adequate Health Literacy (09/18/2023)   B1300 Health Literacy    Frequency of need for help with medical instructions: Never    History reviewed. No pertinent family history.  Allergies[1]  Show/hide medication list[2]  Review of Systems  Constitutional: Negative.   HENT: Negative.    Eyes: Negative.   Respiratory: Negative.  Negative for shortness of breath.   Cardiovascular: Negative.  Negative for chest pain.  Gastrointestinal: Negative.  Negative for abdominal pain, constipation and diarrhea.  Genitourinary: Negative.   Musculoskeletal:  Negative for joint pain and myalgias.  Skin: Negative.   Neurological: Negative.  Negative for dizziness and headaches.  Endo/Heme/Allergies: Negative.   All other systems reviewed and are negative.      Objective:   BP 104/82   Pulse 82   Temp 98.3 F (36.8 C) (Tympanic)   Ht 6' (1.829 m)   Wt 215 lb 6.4 oz (97.7 kg)   SpO2 98%   BMI 29.21 kg/m   Vitals:   10/30/24 1046  BP: 104/82  Pulse: 82  Temp: 98.3 F (36.8 C)  Height: 6' (1.829 m)  Weight: 215 lb 6.4 oz  (97.7 kg)  SpO2: 98%  TempSrc: Tympanic  BMI (Calculated): 29.21    Physical Exam Nursing note reviewed.  Constitutional:      Appearance: Normal appearance. He is normal weight.  HENT:     Head: Normocephalic and atraumatic.     Nose: Nose normal.     Mouth/Throat:     Mouth: Mucous membranes are moist.     Pharynx: Oropharynx is clear.  Eyes:     Extraocular Movements: Extraocular movements intact.     Conjunctiva/sclera: Conjunctivae normal.     Pupils: Pupils are equal, round, and reactive to light.  Cardiovascular:     Rate and Rhythm: Normal rate and regular rhythm.     Pulses:  Normal pulses.     Heart sounds: Normal heart sounds.  Pulmonary:     Effort: Pulmonary effort is normal.     Breath sounds: Normal breath sounds.  Abdominal:     General: Abdomen is flat. Bowel sounds are normal.     Palpations: Abdomen is soft.  Musculoskeletal:        General: Normal range of motion.     Cervical back: Normal range of motion.  Skin:    General: Skin is warm and dry.  Neurological:     General: No focal deficit present.     Mental Status: He is alert and oriented to person, place, and time.  Psychiatric:        Mood and Affect: Mood normal.        Behavior: Behavior normal.        Thought Content: Thought content normal.        Judgment: Judgment normal.      Results for orders placed or performed in visit on 10/30/24  CMP14+EGFR  Result Value Ref Range   Glucose 97 70 - 99 mg/dL   BUN 6 6 - 20 mg/dL   Creatinine, Ser 8.83 0.76 - 1.27 mg/dL   eGFR 84 >40 fO/fpw/8.26   BUN/Creatinine Ratio 5 (L) 9 - 20   Sodium 138 134 - 144 mmol/L   Potassium 4.1 3.5 - 5.2 mmol/L   Chloride 102 96 - 106 mmol/L   CO2 23 20 - 29 mmol/L   Calcium 8.9 8.7 - 10.2 mg/dL   Total Protein 6.1 6.0 - 8.5 g/dL   Albumin 3.4 (L) 4.1 - 5.1 g/dL   Globulin, Total 2.7 1.5 - 4.5 g/dL   Bilirubin Total 0.2 0.0 - 1.2 mg/dL   Alkaline Phosphatase 42 (L) 47 - 123 IU/L   AST 24 0 - 40 IU/L    ALT 21 0 - 44 IU/L  Lipid Profile  Result Value Ref Range   Cholesterol, Total 192 100 - 199 mg/dL   Triglycerides 63 0 - 149 mg/dL   HDL 52 >60 mg/dL   VLDL Cholesterol Cal 12 5 - 40 mg/dL   LDL Chol Calc (NIH) 871 (H) 0 - 99 mg/dL   Chol/HDL Ratio 3.7 0.0 - 5.0 ratio  TSH  Result Value Ref Range   TSH 3.340 0.450 - 4.500 uIU/mL  Hemoglobin A1c  Result Value Ref Range   Hgb A1c MFr Bld 5.5 4.8 - 5.6 %   Est. average glucose Bld gHb Est-mCnc 111 mg/dL  POCT Urinalysis Dipstick (81002)  Result Value Ref Range   Color, UA Yellow    Clarity, UA Cloudy    Glucose, UA Negative Negative   Bilirubin, UA Negative    Ketones, UA Negative    Spec Grav, UA 1.015 1.010 - 1.025   Blood, UA Negative    pH, UA 6.0 5.0 - 8.0   Protein, UA Negative Negative   Urobilinogen, UA 0.2 0.2 or 1.0 E.U./dL   Nitrite, UA Negative    Leukocytes, UA Moderate (2+) (A) Negative   Appearance Cloudy    Odor Yes     Recent Results (from the past 2160 hours)  Lipase, blood     Status: None   Collection Time: 10/28/24  2:14 PM  Result Value Ref Range   Lipase 17 11 - 51 U/L    Comment: Performed at [facility], [address], [place], [place] [address]  Comprehensive metabolic panel     Status: Abnormal  Collection Time: 10/28/24  2:14 PM  Result Value Ref Range   Sodium 135 135 - 145 mmol/L   Potassium 4.0 3.5 - 5.1 mmol/L   Chloride 104 98 - 111 mmol/L   CO2 22 22 - 32 mmol/L   Glucose, Bld 97 70 - 99 mg/dL    Comment: Glucose reference range applies only to samples taken after fasting for at least 8 hours.   BUN 7 6 - 20 mg/dL   Creatinine, Ser 8.94 0.61 - 1.24 mg/dL   Calcium 9.0 8.9 - 89.6 mg/dL   Total Protein 6.2 (L) 6.5 - 8.1 g/dL   Albumin 3.6 3.5 - 5.0 g/dL   AST 26 15 - 41 U/L   ALT 20 0 - 44 U/L   Alkaline Phosphatase 39 38 - 126 U/L   Total Bilirubin 0.3 0.0 - 1.2 mg/dL   GFR, Estimated >39 >39 mL/min    Comment: (NOTE) Calculated using the CKD-EPI Creatinine  Equation (2021)    Anion gap 9 5 - 15    Comment: Performed at [facility], [address] [place], [place] [address]  CBC     Status: Abnormal   Collection Time: 10/28/24  2:14 PM  Result Value Ref Range   WBC 7.8 4.0 - 10.5 K/uL   RBC 4.89 4.22 - 5.81 MIL/uL   Hemoglobin 13.4 13.0 - 17.0 g/dL   HCT 59.6 60.9 - 47.9 %   MCV 82.4 80.0 - 100.0 fL   MCH 27.4 26.0 - 34.0 pg   MCHC 33.3 30.0 - 36.0 g/dL   RDW 83.4 (H) 88.4 - 84.4 %   Platelets 218 150 - 400 K/uL   nRBC 0.0 0.0 - 0.2 %    Comment: Performed at [facility], [address], [place], [place] [address]  Resp panel by RT-PCR (RSV, Flu A&B, Covid) Anterior Nasal Swab     Status: None   Collection Time: 10/28/24  3:28 PM   Specimen: Anterior Nasal Swab  Result Value Ref Range   SARS Coronavirus 2 by RT PCR NEGATIVE NEGATIVE    Comment: (NOTE) SARS-CoV-2 target nucleic acids are NOT DETECTED.  The SARS-CoV-2 RNA is generally detectable in upper respiratory specimens during the acute phase of infection. The lowest concentration of SARS-CoV-2 viral copies this assay can detect is 138 copies/mL. A negative result does not preclude SARS-Cov-2 infection and should not be used as the sole basis for treatment or other patient management decisions. A negative result may occur with  improper specimen collection/handling, submission of specimen other than nasopharyngeal swab, presence of viral mutation(s) within the areas targeted by this assay, and inadequate number of viral copies(<138 copies/mL). A negative result must be combined with clinical observations, patient history, and epidemiological information. The expected result is Negative.  Fact Sheet for Patients:  bloggercourse.com  Fact Sheet for Healthcare Providers:  seriousbroker.it  This test is no t yet approved or cleared by the [place]  FDA and  has been authorized for detection and/or  diagnosis of SARS-CoV-2 by FDA under an Emergency Use Authorization (EUA). This EUA will remain  in effect (meaning this test can be used) for the duration of the COVID-19 declaration under Section 564(b)(1) of the Act, 21 U.S.C.section 360bbb-3(b)(1), unless the authorization is terminated  or revoked sooner.       Influenza A by PCR NEGATIVE NEGATIVE   Influenza B by PCR NEGATIVE NEGATIVE    Comment: (NOTE) The Xpert Xpress SARS-CoV-2/FLU/RSV plus assay is intended  as an aid in the diagnosis of influenza from Nasopharyngeal swab specimens and should not be used as a sole basis for treatment. Nasal washings and aspirates are unacceptable for Xpert Xpress SARS-CoV-2/FLU/RSV testing.  Fact Sheet for Patients: bloggercourse.com  Fact Sheet for Healthcare Providers: seriousbroker.it  This test is not yet approved or cleared by the [place]  FDA and has been authorized for detection and/or diagnosis of SARS-CoV-2 by FDA under an Emergency Use Authorization (EUA). This EUA will remain in effect (meaning this test can be used) for the duration of the COVID-19 declaration under Section 564(b)(1) of the Act, 21 U.S.C. section 360bbb-3(b)(1), unless the authorization is terminated or revoked.     Resp Syncytial Virus by PCR NEGATIVE NEGATIVE    Comment: (NOTE) Fact Sheet for Patients: bloggercourse.com  Fact Sheet for Healthcare Providers: seriousbroker.it  This test is not yet approved or cleared by the [place]  FDA and has been authorized for detection and/or diagnosis of SARS-CoV-2 by FDA under an Emergency Use Authorization (EUA). This EUA will remain in effect (meaning this test can be used) for the duration of the COVID-19 declaration under Section 564(b)(1) of the Act, 21 U.S.C. section 360bbb-3(b)(1), unless the authorization is terminated or revoked.  Performed  at [facility], [address] [place], [place] [address]   Urinalysis, Routine w reflex microscopic -Urine, Clean Catch     Status: Abnormal   Collection Time: 10/28/24  5:17 PM  Result Value Ref Range   Color, Urine YELLOW (A) YELLOW   APPearance HAZY (A) CLEAR   Specific Gravity, Urine >1.046 (H) 1.005 - 1.030   pH 5.0 5.0 - 8.0   Glucose, UA NEGATIVE NEGATIVE mg/dL   Hgb urine dipstick SMALL (A) NEGATIVE   Bilirubin Urine NEGATIVE NEGATIVE   Ketones, ur NEGATIVE NEGATIVE mg/dL   Protein, ur NEGATIVE NEGATIVE mg/dL   Nitrite NEGATIVE NEGATIVE   Leukocytes,Ua LARGE (A) NEGATIVE   RBC / HPF 21-50 0 - 5 RBC/hpf   WBC, UA >50 0 - 5 WBC/hpf   Bacteria, UA RARE (A) NONE SEEN   Squamous Epithelial / HPF 0-5 0 - 5 /HPF   WBC Clumps PRESENT    Mucus PRESENT     Comment: Performed at [facility], [address] [place], [place] [address]  POCT Urinalysis Dipstick [phone])     Status: Abnormal   Collection Time: 10/30/24 11:01 AM  Result Value Ref Range   Color, UA Yellow    Clarity, UA Cloudy    Glucose, UA Negative Negative   Bilirubin, UA Negative    Ketones, UA Negative    Spec Grav, UA 1.015 1.010 - 1.025   Blood, UA Negative    pH, UA 6.0 5.0 - 8.0   Protein, UA Negative Negative   Urobilinogen, UA 0.2 0.2 or 1.0 E.U./dL   Nitrite, UA Negative    Leukocytes, UA Moderate (2+) (A) Negative   Appearance Cloudy    Odor Yes   CMP14+EGFR     Status: Abnormal   Collection Time: 10/30/24 11:13 AM  Result Value Ref Range   Glucose 97 70 - 99 mg/dL   BUN 6 6 - 20 mg/dL   Creatinine, Ser 8.83 0.76 - 1.27 mg/dL   eGFR 84 >40 fO/fpw/8.26   BUN/Creatinine Ratio 5 (L) 9 - 20   Sodium 138 134 - 144 mmol/L   Potassium 4.1 3.5 - 5.2 mmol/L   Chloride 102 96 - 106 mmol/L   CO2 23 20 -  29 mmol/L   Calcium 8.9 8.7 - 10.2 mg/dL   Total Protein 6.1 6.0 - 8.5 g/dL   Albumin 3.4 (L) 4.1 - 5.1 g/dL   Globulin, Total 2.7 1.5 - 4.5 g/dL   Bilirubin Total 0.2 0.0 -  1.2 mg/dL   Alkaline Phosphatase 42 (L) 47 - 123 IU/L   AST 24 0 - 40 IU/L   ALT 21 0 - 44 IU/L  Lipid Profile     Status: Abnormal   Collection Time: 10/30/24 11:13 AM  Result Value Ref Range   Cholesterol, Total 192 100 - 199 mg/dL   Triglycerides 63 0 - 149 mg/dL   HDL 52 >60 mg/dL   VLDL Cholesterol Cal 12 5 - 40 mg/dL   LDL Chol Calc (NIH) 871 (H) 0 - 99 mg/dL   Chol/HDL Ratio 3.7 0.0 - 5.0 ratio    Comment:                                   T. Chol/HDL Ratio                                             Men  Women                               1/2 Avg.Risk  3.4    3.3                                   Avg.Risk  5.0    4.4                                2X Avg.Risk  9.6    7.1                                3X Avg.Risk 23.4   11.0   TSH     Status: None   Collection Time: 10/30/24 11:13 AM  Result Value Ref Range   TSH 3.340 0.450 - 4.500 uIU/mL  Hemoglobin A1c     Status: None   Collection Time: 10/30/24 11:13 AM  Result Value Ref Range   Hgb A1c MFr Bld 5.5 4.8 - 5.6 %    Comment:          Prediabetes: 5.7 - 6.4          Diabetes: >6.4          Glycemic control for adults with diabetes: <7.0    Est. average glucose Bld gHb Est-mCnc 111 mg/dL      Assessment & Plan:  Urine culture Referral sent to urology Lab work today Referral sent to podiatry Continue current medications  Problem List Items Addressed This Visit       Genitourinary   Acute cystitis with hematuria - Primary   Relevant Orders   POCT Urinalysis Dipstick (81002) (Completed)   Frequent UTI   Relevant Orders   Ambulatory referral to Urology     Other   Major depressive disorder, recurrent episode, severe, with psychosis (HCC)   Relevant Medications  traZODone  (DESYREL ) 100 MG tablet   sertraline  (ZOLOFT ) 100 MG tablet   OLANZapine  (ZYPREXA ) 5 MG tablet   Personality disorder (HCC)   Relevant Medications   OLANZapine  (ZYPREXA ) 5 MG tablet   Other Visit Diagnoses       Pain in both  feet       Relevant Orders   Ambulatory referral to Podiatry     Lipid screening       Relevant Orders   Lipid Profile (Completed)     Thyroid  disorder screening       Relevant Orders   TSH (Completed)     Diabetes mellitus screening       Relevant Orders   CMP14+EGFR (Completed)   Hemoglobin A1c (Completed)       Return in about 4 months (around 02/27/2025).   Total time spent: 25 minutes. This time includes review of previous notes and results and patient face to face  interaction during today's visit.    Jeoffrey Pollen, NP  10/30/2024   This document may have been prepared by [facility] Voice Recognition software and as such may include unintentional dictation errors.      [1] No Known Allergies [2]  Outpatient Medications Prior to Visit  Medication Sig   albuterol  (VENTOLIN  HFA) 108 (90 Base) MCG/ACT inhaler Inhale 2 puffs into the lungs every 6 (six) hours as needed for wheezing or shortness of breath.   cefdinir  (OMNICEF ) 300 MG capsule Take 1 capsule (300 mg total) by mouth 2 (two) times daily for 10 days.   oxyCODONE  (ROXICODONE ) 5 MG immediate release tablet Take 1 tablet (5 mg total) by mouth every 8 (eight) hours as needed for up to 12 doses.   [DISCONTINUED] OLANZapine  (ZYPREXA ) 5 MG tablet Take 1 tablet (5 mg total) by mouth at bedtime.   [DISCONTINUED] sertraline  (ZOLOFT ) 100 MG tablet Take 1 tablet (100 mg total) by mouth daily.   [DISCONTINUED] traZODone  (DESYREL ) 100 MG tablet Take 1 tablet (100 mg total) by mouth at bedtime.   No facility-administered medications prior to visit.   "

## 2024-10-31 DIAGNOSIS — N390 Urinary tract infection, site not specified: Secondary | ICD-10-CM | POA: Insufficient documentation

## 2024-10-31 LAB — CMP14+EGFR
ALT: 21 IU/L (ref 0–44)
AST: 24 IU/L (ref 0–40)
Albumin: 3.4 g/dL — ABNORMAL LOW (ref 4.1–5.1)
Alkaline Phosphatase: 42 IU/L — ABNORMAL LOW (ref 47–123)
BUN/Creatinine Ratio: 5 — ABNORMAL LOW (ref 9–20)
BUN: 6 mg/dL (ref 6–20)
Bilirubin Total: 0.2 mg/dL (ref 0.0–1.2)
CO2: 23 mmol/L (ref 20–29)
Calcium: 8.9 mg/dL (ref 8.7–10.2)
Chloride: 102 mmol/L (ref 96–106)
Creatinine, Ser: 1.16 mg/dL (ref 0.76–1.27)
Globulin, Total: 2.7 g/dL (ref 1.5–4.5)
Glucose: 97 mg/dL (ref 70–99)
Potassium: 4.1 mmol/L (ref 3.5–5.2)
Sodium: 138 mmol/L (ref 134–144)
Total Protein: 6.1 g/dL (ref 6.0–8.5)
eGFR: 84 mL/min/1.73

## 2024-10-31 LAB — HEMOGLOBIN A1C
Est. average glucose Bld gHb Est-mCnc: 111 mg/dL
Hgb A1c MFr Bld: 5.5 % (ref 4.8–5.6)

## 2024-10-31 LAB — LIPID PANEL
Chol/HDL Ratio: 3.7 ratio (ref 0.0–5.0)
Cholesterol, Total: 192 mg/dL (ref 100–199)
HDL: 52 mg/dL
LDL Chol Calc (NIH): 128 mg/dL — ABNORMAL HIGH (ref 0–99)
Triglycerides: 63 mg/dL (ref 0–149)
VLDL Cholesterol Cal: 12 mg/dL (ref 5–40)

## 2024-10-31 LAB — TSH: TSH: 3.34 u[IU]/mL (ref 0.450–4.500)

## 2024-11-19 ENCOUNTER — Ambulatory Visit: Payer: MEDICAID | Admitting: Podiatry

## 2024-11-19 DIAGNOSIS — M216X2 Other acquired deformities of left foot: Secondary | ICD-10-CM

## 2024-11-19 DIAGNOSIS — L0889 Other specified local infections of the skin and subcutaneous tissue: Secondary | ICD-10-CM | POA: Diagnosis not present

## 2024-11-19 DIAGNOSIS — M216X1 Other acquired deformities of right foot: Secondary | ICD-10-CM

## 2024-11-19 MED ORDER — TERBINAFINE HCL 250 MG PO TABS: 250.0000 mg | ORAL_TABLET | Freq: Every day | ORAL | 0 refills | Status: AC

## 2024-11-19 MED ORDER — DOXYCYCLINE HYCLATE 100 MG PO TABS: 100.0000 mg | ORAL_TABLET | Freq: Two times a day (BID) | ORAL | 0 refills | Status: AC

## 2025-02-27 ENCOUNTER — Ambulatory Visit: Payer: MEDICAID | Admitting: Internal Medicine
